# Patient Record
Sex: Female | Born: 1950
Health system: Southern US, Community
[De-identification: ages and names within clinical notes are randomized; demographics above are authoritative.]

## PROBLEM LIST (undated history)

## (undated) DIAGNOSIS — M069 Rheumatoid arthritis, unspecified: Secondary | ICD-10-CM

## (undated) HISTORY — DX: Rheumatoid arthritis, unspecified: M06.9

## (undated) HISTORY — PX: BREAST BIOPSY: SHX20

## (undated) HISTORY — PX: HIP SURGERY: SHX245

## (undated) HISTORY — PX: SHOULDER SURGERY: SHX246

---

## 2012-03-07 DIAGNOSIS — M169 Osteoarthritis of hip, unspecified: Secondary | ICD-10-CM | POA: Insufficient documentation

## 2014-03-08 ENCOUNTER — Encounter: Payer: Self-pay | Admitting: Internal Medicine

## 2014-03-08 ENCOUNTER — Ambulatory Visit (INDEPENDENT_AMBULATORY_CARE_PROVIDER_SITE_OTHER): Payer: BC Managed Care – PPO | Admitting: Internal Medicine

## 2014-03-08 ENCOUNTER — Telehealth: Payer: Self-pay | Admitting: Internal Medicine

## 2014-03-08 VITALS — BP 100/62 | HR 76 | Temp 97.5°F | Resp 12 | Ht 65.0 in | Wt 130.0 lb

## 2014-03-08 DIAGNOSIS — E039 Hypothyroidism, unspecified: Secondary | ICD-10-CM | POA: Insufficient documentation

## 2014-03-08 MED ORDER — LEVOTHYROXINE SODIUM 50 MCG PO TABS: 50.0000 ug | ORAL_TABLET | Freq: Every day | ORAL | Status: DC

## 2014-03-08 NOTE — Progress Notes (Signed)
Patient ID: Virginia Gordon, female   DOB: 1950-10-09, 63 y.o.   MRN: [id number]   HPI  [name] is a 63 y.o.-year-old female, referred by her PCP, Dr. Moreen Fowler, for management of hypothyroidism.  Pt. Had neck surgery 36 years ago (thyroid not removed - a non-thyroidal benign mass). She has been dx with hypothyroidism in 09/2012; started on Levothyroxine (LT4) 25 mcg >> then increased (she took it on herself to increase the dose to 2x >> 150 mcg daily >> felt normal >> now is on Levothyroxine 75 mcg, taken: - fasting - with water - separated by >30 min from b'fast  - takes in am: Adderrall, biotin, vitamin C, vitamin D, Naproxen-Prilosec (Vimovo) at the same time with LT4 - no calcium, iron, PPIs, multivitamins   I reviewed pt's thyroid tests: 12/06/2013; TSH 1.32, fT4 1.86   Pt describes: - high BP >> took HCTZ >> stopped now >> saw nephrology in the past (has a low Na, 128).  - had fatigue, now resolved (from MTX) - + weight gain and weight loss: 2-3 lbs a day - + cold intolerance/heat intolerance - no palpitations - + constipation/+ diarrhea - + dry skin - no hair falling - depression  Pt denies feeling nodules in neck, hoarseness, dysphagia/odynophagia, SOB with lying down.  She swims regularly.  She has + FH of thyroid disorders in: mother and daughter (hypothyroidism). No FH of thyroid cancer.  No h/o radiation tx to head or neck. No recent use of iodine supplements.  I reviewed her chart and she also has a history of RA, hyponatremia (chronic, followed by PCP).   ROS: Constitutional: see HPI, + increased urination Eyes: no blurry vision, no xerophthalmia ENT: no sore throat, no nodules palpated in throat, no dysphagia/odynophagia, no hoarseness Cardiovascular: no CP/SOB/palpitations/+ leg swelling Respiratory: no cough/SOB Gastrointestinal: no N/V/+ D/+ C Musculoskeletal: + both: muscle/joint aches Skin: + rash, + easy bruising Neurological: no  tremors/numbness/tingling/dizziness, + HA Psychiatric: no depression/anxiety  PMH: - hypothyroidism - hyponatremia - RA, OA - HL - ?HTN  PSxH: - 4 hip surgeries - 2 shoulder Sx  Occupational History  . Futures trader    Social History Main Topics  . Smoking status: Former Research scientist (life sciences)  . Smokeless tobacco: Not on file  . Alcohol Use: 3.5 oz/week    7 drink(s) per week  . Drug Use: No   Social History Research scientist (medical): Married    2 children; ages 64 and 64   First menstrual cycle at 11 yrs   Partial hysterectomy at 39 yrs   7 pregnancies, 6 miscarriages, 1 birth   Quit smoking in [place] - 2 glasses, daily   Regular execise: walking 2 times a week; Swimming 4 times a week   Name  Route  Sig   . amphetamine-dextroamphetamine (ADDERALL) 10 MG tablet      20 mg 2 (two) times daily.    . Ascorbic Acid (VITAMIN C) 500 MG CHEW   Oral   Chew by mouth.    . B Complex Vitamins (VITAMIN-B COMPLEX) TABS   Oral   Take by mouth.    . calcium carbonate 200 MG capsule   Oral   Take 250 mg by mouth daily.    . Cholecalciferol (VITAMIN D-1000 MAX ST) 1000 UNITS tablet   Oral   Take by mouth.    . diazepam (VALIUM) 10 MG tablet      every 12 (twelve) hours as needed.    Marland Kitchen  diazepam (VALIUM) 5 MG tablet          . ENBREL SURECLICK 50 MG/ML injection          . Estradiol (ELESTRIN) 0.52 MG/0.87 GM (0.06%) GEL   Topical   Apply topically.    . fexofenadine (ALLEGRA) 180 MG tablet   Oral   Take by mouth.    . fluticasone (FLONASE) 50 MCG/ACT nasal spray          . folic acid (FOLVITE) 1 MG tablet          . levothyroxine (SYNTHROID, LEVOTHROID) 75 MCG tablet   Oral   Take 1 tablet (75 mcg total) by mouth daily.    . Multiple Vitamin (MULTIVITAMIN) capsule   Oral   Take by mouth.    . Naproxen-Esomeprazole (VIMOVO) 500-20 MG TBEC          . oxyCODONE (OXY IR/ROXICODONE) 5 MG immediate release tablet   Oral   Take by mouth.    .  tretinoin (RETIN-A) 0.1 % cream   Topical   Apply topically.    Marland Kitchen zolpidem (AMBIEN) 10 MG tablet   Oral   Take by mouth.      Allergies  Allergen Reactions  . Vancomycin Dermatitis    Red, warm, coughing, anxious reaction   FH: - HTN, HL, heart ds in father  PE: BP 100/62  Pulse 76  Temp(Src) 97.5 F (36.4 C) (Oral)  Resp 12  Ht 5\' 5"  (1.651 m)  Wt 130 lb (58.968 kg)  BMI 21.63 kg/m2  SpO2 97% Wt Readings from Last 3 Encounters:  03/08/14 130 lb (58.968 kg)   Constitutional: normal weight, in NAD Eyes: PERRLA, EOMI, no exophthalmos ENT: moist mucous membranes, no thyromegaly, no cervical lymphadenopathy Cardiovascular: RRR, No MRG Respiratory: CTA B Gastrointestinal: abdomen soft, NT, ND, BS+ Musculoskeletal: no deformities, strength intact in all 4 Skin: moist, warm, no rashes Neurological: no tremor with outstretched hands, DTR normal in all 4  ASSESSMENT: 1. Hypothyroidism  PLAN:  1. Patient with long-standing hypothyroidism, on levothyroxine therapy. She appears euthyroid. She does not appear to have a goiter, thyroid nodules, or neck compression symptoms - she was wondering whether her LT4 tx could be related to her occasional high BP, but I doubt this, in the setting of a normal TSH - We discussed about correct intake of levothyroxine, fasting, with water, separated by at least 30 minutes from breakfast, and separated by more than 4 hours from calcium, iron, multivitamins, acid reflux medications (PPIs).She is now taking it along with her PPI >> advised to separate it by >4h. - since we are separating the LT4 from the PPI >> will also decrease the LT4 dose to 50 mcg daily - will check thyroid tests in 5-6 weeks: TSH, free T4 - I will see her back in 4 months

## 2014-03-08 NOTE — Telephone Encounter (Signed)
Called pt and advised her per Dr Arman Filter note. Advised the pt to call and schedule a time to come back and have labs drawn before she decreases the dose of Synthroid. Be advised

## 2014-03-08 NOTE — Telephone Encounter (Signed)
Patient stated that her synthorid haven't been re-tested since her primary doctor lowered it  to 75 mg.  She wanted Dr Cruzita Lederer to know.  Please advise

## 2014-03-08 NOTE — Telephone Encounter (Signed)
Oh, I thought the last set of labs were ON 75 mcg. It is not clear why the dose was decreased if TSH was normal? In that case, we will need a new set of labs before decreasing the dose. Sorry! Can you please order TSH, fT4 for now if she can come back.Marland KitchenMarland Kitchen

## 2014-03-08 NOTE — Patient Instructions (Signed)
Please reduce the dose of your Levothyroxine to 50 mcg and separate the Levothyroxine from Protonix by at least 4 hours.  Please come back for labs in 6 weeks.  Please return in 4 months.

## 2014-03-08 NOTE — Telephone Encounter (Signed)
Please read note below

## 2014-03-11 ENCOUNTER — Other Ambulatory Visit: Payer: BC Managed Care – PPO

## 2014-03-11 NOTE — Telephone Encounter (Signed)
Returned pt's call. Pt stated that she has only been on the 75 mcg for 3 weeks and she realized last week that she has been taking it incorrectly. Pt wanted to wait 3 more weeks before returning to have labs done so that there would be a more accurate reading of her thyroid. Pt re-scheduled with me to come in for labs on Aug. 31st. Be advised.

## 2014-03-11 NOTE — Telephone Encounter (Signed)
Patient would like for you to call her concerning her lab work.

## 2014-03-11 NOTE — Telephone Encounter (Signed)
OK, this sounds good.   

## 2014-04-01 ENCOUNTER — Other Ambulatory Visit: Payer: BC Managed Care – PPO

## 2014-04-19 ENCOUNTER — Other Ambulatory Visit (INDEPENDENT_AMBULATORY_CARE_PROVIDER_SITE_OTHER): Payer: BC Managed Care – PPO

## 2014-04-19 ENCOUNTER — Other Ambulatory Visit: Payer: BC Managed Care – PPO

## 2014-04-19 ENCOUNTER — Other Ambulatory Visit: Payer: Self-pay | Admitting: *Deleted

## 2014-04-19 DIAGNOSIS — E039 Hypothyroidism, unspecified: Secondary | ICD-10-CM

## 2014-04-19 LAB — T4, FREE: Free T4: 1.31 ng/dL (ref 0.60–1.60)

## 2014-04-19 LAB — TSH: TSH: 0.78 u[IU]/mL (ref 0.35–4.50)

## 2014-05-08 ENCOUNTER — Encounter (INDEPENDENT_AMBULATORY_CARE_PROVIDER_SITE_OTHER): Payer: Self-pay

## 2014-05-08 ENCOUNTER — Ambulatory Visit (INDEPENDENT_AMBULATORY_CARE_PROVIDER_SITE_OTHER): Payer: BC Managed Care – PPO | Admitting: Family Medicine

## 2014-05-08 VITALS — BP 151/96 | HR 69 | Ht 65.0 in | Wt 124.0 lb

## 2014-05-08 DIAGNOSIS — M722 Plantar fascial fibromatosis: Secondary | ICD-10-CM

## 2014-05-08 DIAGNOSIS — M79671 Pain in right foot: Secondary | ICD-10-CM

## 2014-05-08 DIAGNOSIS — M25511 Pain in right shoulder: Secondary | ICD-10-CM

## 2014-05-08 MED ORDER — NITROGLYCERIN 0.2 MG/HR TD PT24: MEDICATED_PATCH | TRANSDERMAL | Status: DC

## 2014-05-08 MED ORDER — METHYLPREDNISOLONE ACETATE 40 MG/ML IJ SUSP
40.0000 mg | Freq: Once | INTRAMUSCULAR | Status: AC
  Administered 2014-05-08: 40 mg via INTRA_ARTICULAR

## 2014-05-08 MED ORDER — METHYLPREDNISOLONE ACETATE 40 MG/ML IJ SUSP
40.0000 mg | Freq: Once | INTRAMUSCULAR | Status: AC
Start: 2014-05-08 — End: 2014-05-08
  Administered 2014-05-08: 40 mg via INTRA_ARTICULAR

## 2014-05-08 NOTE — Patient Instructions (Signed)
You have plantar fasciitis of your right foot as well as extensor digitorum tendinopathy. Naproxen and/or tylenol. Plantar fascia stretch for 20-30 seconds (do 3 of these) in morning Lowering/raise on a step exercises 3 x 10 once a day - this is very important for long term recovery. Consider cam walker to rest both of these when up and walking around. Ice heel for 15 minutes as needed. Avoid flat shoes/barefoot walking as much as possible. Orthotics with heel lift may be helpful. (consider dr. Zoe Lan active series insoles). Steroid injection is a consideration for short term pain relief if you are struggling - you were given this today. Physical therapy is also an option.  You have rotator cuff tendinitis of your right shoulder. Try to avoid painful activities (overhead activities, lifting with extended arm) as much as possible. Continue your naproxen. Can take tylenol in addition to this. Subacromial injection may be beneficial to help with pain and to decrease inflammation - you were given this today. Do home exercise program with theraband and scapular stabilization exercises daily - these are very important for long term relief even if an injection was given.  3 sets of 10 each direction once a day. Nitro patches - 1/4th of a patch placed over the right shoulder, change every day. Follow up with me in 6 weeks for reevaluation.

## 2014-05-10 ENCOUNTER — Telehealth: Payer: Self-pay | Admitting: Family Medicine

## 2014-05-13 NOTE — Telephone Encounter (Signed)
There aren't a ton of other exercises.  Besides the theraband exercises, raising/lowering on a step, we also discussed exercises where you push your toes forward on a towel.  Physical therapy is another option for this even if it's a visit or two to review a more extensive program.

## 2014-05-13 NOTE — Telephone Encounter (Signed)
Spoke with patient on Friday and she is wanting some exercises for the extensor digitorum tendinopathy. She was given the stretch and lowering/raise exercises to do. She would like to know if there are any other exercises.

## 2014-05-13 NOTE — Telephone Encounter (Signed)
Spoke with patient and told her the option to do Physical Therapy for a visit or two and she will let me know if she wants to go that route.

## 2014-05-14 ENCOUNTER — Encounter: Payer: Self-pay | Admitting: Family Medicine

## 2014-05-14 DIAGNOSIS — M25511 Pain in right shoulder: Secondary | ICD-10-CM | POA: Insufficient documentation

## 2014-05-14 DIAGNOSIS — M25512 Pain in left shoulder: Secondary | ICD-10-CM

## 2014-05-14 DIAGNOSIS — M79671 Pain in right foot: Secondary | ICD-10-CM | POA: Insufficient documentation

## 2014-05-14 NOTE — Assessment & Plan Note (Signed)
Right shoulder rotator cuff tendinitis - avoid overhead lifting, reaching as much as possible.  Continue naproxen.  Subacromial injection given today.  Shown home exercises as well.  Nitro patches - 1/4th of patch, change daily.  Discussed risks of skin irritation, headache.  Consider physical therapy if not improving.  Follow up in 4-6 weeks.  After informed written consent, patient was seated on exam table. Right shoulder was prepped with alcohol swab and utilizing posterior approach, patient's right subacromial space was injected with 3:1 marcaine: depomedrol. Patient tolerated the procedure well without immediate complications.

## 2014-05-14 NOTE — Assessment & Plan Note (Signed)
2/2 combination of plantar fasciitis and extensor digitorum tendinopathy.  Shown home exercise program to do daily.  Naproxen or tylenol.  Consider cam walker initially especially for extensor tendinopathy.  Icing, avoid flat shoes/barefoot walking.  Given cortisone injection for plantar fascia pain today as well.  Consider physical therapy.  After informed written consent patient was seated on exam table.  Area of maximal pain within midportion of plantar fascia prepped with alcohol swab.  Then patient's right plantar fascia was injected with 2:1 marcaine: depomedrol with multiple needle fenestrations.  Patient tolerated procedure well without immediate complications.

## 2014-05-14 NOTE — Progress Notes (Signed)
Patient ID: Virginia Gordon, female   DOB: [birth year], 63 y.o.   MRN: [id number]  PCP: Gara Kroner, MD  Subjective:   HPI: Patient is a 63 y.o. female here for right foot and right shoulder pain.  1. Right foot pain - Started having problems over a year ago after walking a lot in [place]. Started to get arch pain, swelling. Has rheumatoid arthritis. Reports her rheumatologist did give her a shot dorsal foot in past and has done an ultrasound on her foot. Unsure where the shot was placed and for what condition though. Bothers her more by end of day. Tried naproxen. No issues with sitting, rest.  2. Right shoulder pain - about 1 1/2 years ago had rotator cuff surgery twice for left shoulder. She does a lot of overhead activities Had a cortisone injection in March with some benefit. Physical therapy has not been helpful. Pain up to 4/10 with activities.  Past Medical History  Diagnosis Date  . Rheumatoid arthritis     Current Outpatient Prescriptions on File Prior to Visit  Medication Sig Dispense Refill  . amphetamine-dextroamphetamine (ADDERALL) 10 MG tablet 20 mg 2 (two) times daily.      . Ascorbic Acid (VITAMIN C) 500 MG CHEW Chew by mouth.      . B Complex Vitamins (VITAMIN-B COMPLEX) TABS Take by mouth.      . calcium carbonate 200 MG capsule Take 250 mg by mouth daily.      . Cholecalciferol (VITAMIN D-1000 MAX ST) 1000 UNITS tablet Take by mouth.      . diazepam (VALIUM) 10 MG tablet every 12 (twelve) hours as needed.      . diazepam (VALIUM) 5 MG tablet       . ENBREL SURECLICK 50 MG/ML injection       . Estradiol (ELESTRIN) 0.52 MG/0.87 GM (0.06%) GEL Apply topically.      . fexofenadine (ALLEGRA) 180 MG tablet Take by mouth.      . fluticasone (FLONASE) 50 MCG/ACT nasal spray       . folic acid (FOLVITE) 1 MG tablet       . levothyroxine (SYNTHROID, LEVOTHROID) 50 MCG tablet Take 1 tablet (50 mcg total) by mouth daily.  60 tablet  3  . Multiple Vitamin (MULTIVITAMIN)  capsule Take by mouth.      . Naproxen-Esomeprazole (VIMOVO) 500-20 MG TBEC       . oxyCODONE (OXY IR/ROXICODONE) 5 MG immediate release tablet Take by mouth.      . tretinoin (RETIN-A) 0.1 % cream Apply topically.      Marland Kitchen zolpidem (AMBIEN) 10 MG tablet Take by mouth.       No current facility-administered medications on file prior to visit.    Past Surgical History  Procedure Laterality Date  . Hip surgery    . Shoulder surgery      Allergies  Allergen Reactions  . Vancomycin Dermatitis    Red, warm, coughing, anxious reaction    History   Social History  . Marital Status: Unknown    Spouse Name: N/A    Number of Children: 1  . Years of Education: N/A   Occupational History  . Futures trader    Social History Main Topics  . Smoking status: Former Research scientist (life sciences)  . Smokeless tobacco: Not on file  . Alcohol Use: 3.5 oz/week    7 drink(s) per week  . Drug Use: No  . Sexual Activity: Not on file   Other Topics Concern  .  Not on file   Social History Narrative   Futures trader: Married    2 children; ages 71 and 46   First menstrual cycle at 11 yrs   Partial hysterectomy at 39 yrs   7 pregnancies, 6 miscarriages, 1 birth   Quit smoking in [place] - 2 glasses, daily   Regular execise: walking 2 times a week; Swimming 4 times a week    History reviewed. No pertinent family history.  BP 151/96  Pulse 69  Ht 5\' 5"  (1.651 m)  Wt 124 lb (56.246 kg)  BMI 20.63 kg/m2  Review of Systems: See HPI above.    Objective:  Physical Exam:  Gen: NAD  Right foot/ankle: No gross deformity, swelling, ecchymoses FROM with 5/5 strength all directions. TTP over extensor tendons as they cross ankle as well as within plantar fascia. Negative ant drawer and talar tilt.   Negative syndesmotic compression. Negative calcaneal squeeze, metatarsal squeeze. Thompsons test negative. NV intact distally.  Right shoulder: No swelling, ecchymoses.  No gross deformity. No  TTP. FROM without painful arc. Negative Hawkins, Neers. Negative Speeds, Yergasons. Strength 5/5 with empty can and resisted internal/external rotation.  Pain empty can. Negative apprehension. NV intact distally.    Assessment & Plan:  1. Right foot pain - 2/2 combination of plantar fasciitis and extensor digitorum tendinopathy.  Shown home exercise program to do daily.  Naproxen or tylenol.  Consider cam walker initially especially for extensor tendinopathy.  Icing, avoid flat shoes/barefoot walking.  Given cortisone injection for plantar fascia pain today as well.  Consider physical therapy.  After informed written consent patient was seated on exam table.  Area of maximal pain within midportion of plantar fascia prepped with alcohol swab.  Then patient's right plantar fascia was injected with 2:1 marcaine: depomedrol with multiple needle fenestrations.  Patient tolerated procedure well without immediate complications.  2. Right shoulder rotator cuff tendinitis - avoid overhead lifting, reaching as much as possible.  Continue naproxen.  Subacromial injection given today.  Shown home exercises as well.  Nitro patches - 1/4th of patch, change daily.  Discussed risks of skin irritation, headache.  Consider physical therapy if not improving.  Follow up in 4-6 weeks.  After informed written consent, patient was seated on exam table. Right shoulder was prepped with alcohol swab and utilizing posterior approach, patient's right subacromial space was injected with 3:1 marcaine: depomedrol. Patient tolerated the procedure well without immediate complications.

## 2014-05-16 ENCOUNTER — Ambulatory Visit (INDEPENDENT_AMBULATORY_CARE_PROVIDER_SITE_OTHER): Payer: BC Managed Care – PPO | Admitting: Family Medicine

## 2014-05-16 ENCOUNTER — Encounter: Payer: Self-pay | Admitting: Family Medicine

## 2014-05-16 VITALS — BP 163/109 | HR 69 | Ht 65.0 in | Wt 123.0 lb

## 2014-05-16 DIAGNOSIS — M25562 Pain in left knee: Secondary | ICD-10-CM

## 2014-05-16 MED ORDER — METHYLPREDNISOLONE ACETATE 40 MG/ML IJ SUSP
40.0000 mg | Freq: Once | INTRAMUSCULAR | Status: AC
Start: 2014-05-16 — End: 2014-05-16
  Administered 2014-05-16: 40 mg via INTRA_ARTICULAR

## 2014-05-16 NOTE — Patient Instructions (Signed)
I'm concerned you tore the medial meniscus of your left knee. You were given a cortisone shot today. Icing 15 minutes at a time 3-4 times a day. Ace wrap, knee sleeve, or knee brace for support and compression. Elevate above the level of your heart when possible. Knee extensions, straight leg raises, hamstring curls 3 sets of 10 once a day. Consider physical therapy as well.  Consider scaphoid pads in your dress shoes. Follow up with me in 5-6 weeks.

## 2014-05-20 ENCOUNTER — Encounter: Payer: Self-pay | Admitting: Family Medicine

## 2014-05-20 DIAGNOSIS — M25562 Pain in left knee: Secondary | ICD-10-CM | POA: Insufficient documentation

## 2014-05-20 NOTE — Assessment & Plan Note (Signed)
consistent with medial meniscus tear.  Went ahead with intraarticular injection today.  Icing, ace wrap or brace for support and compression.  Start home exercise program which was reviewed today.  Consider physical therapy.  F/u in 5-6 weeks.    After informed written consent, patient was lying supine on exam table. Left knee was prepped with alcohol swab and utilizing superolateral approach with ultrasound guidance, patient's left knee was injected intraarticularly with 3:1 marcaine: depomedrol. Patient tolerated the procedure well without immediate complications.

## 2014-05-20 NOTE — Progress Notes (Signed)
Patient ID: Virginia Gordon, female   DOB: [birth year], 62 y.o.   MRN: [id number]  PCP: Gara Kroner, MD  Subjective:   HPI: Patient is a 63 y.o. female here for left knee pain.  Patient reports she went hiking in the [place] yesterday about 2 miles. Turned around to come down from trail and felt a pull in back of knee. Couldn't walk after about 20 minutes. Iced the area, rested. Slightly swollen since then. No catching, locking. No prior issues with this knee.  Past Medical History  Diagnosis Date  . Rheumatoid arthritis     Current Outpatient Prescriptions on File Prior to Visit  Medication Sig Dispense Refill  . amphetamine-dextroamphetamine (ADDERALL) 10 MG tablet 20 mg 2 (two) times daily.      . Ascorbic Acid (VITAMIN C) 500 MG CHEW Chew by mouth.      . B Complex Vitamins (VITAMIN-B COMPLEX) TABS Take by mouth.      . calcium carbonate 200 MG capsule Take 250 mg by mouth daily.      . Cholecalciferol (VITAMIN D-1000 MAX ST) 1000 UNITS tablet Take by mouth.      . diazepam (VALIUM) 10 MG tablet every 12 (twelve) hours as needed.      . diazepam (VALIUM) 5 MG tablet       . ENBREL SURECLICK 50 MG/ML injection       . Estradiol (ELESTRIN) 0.52 MG/0.87 GM (0.06%) GEL Apply topically.      . fexofenadine (ALLEGRA) 180 MG tablet Take by mouth.      . fluticasone (FLONASE) 50 MCG/ACT nasal spray       . folic acid (FOLVITE) 1 MG tablet       . levothyroxine (SYNTHROID, LEVOTHROID) 50 MCG tablet Take 1 tablet (50 mcg total) by mouth daily.  60 tablet  3  . Multiple Vitamin (MULTIVITAMIN) capsule Take by mouth.      . Naproxen-Esomeprazole (VIMOVO) 500-20 MG TBEC       . nitroGLYCERIN (NITRODUR - DOSED IN MG/24 HR) 0.2 mg/hr patch Apply 1/4th patch to affected shoulder, change daily  30 patch  1  . oxyCODONE (OXY IR/ROXICODONE) 5 MG immediate release tablet Take by mouth.      . tretinoin (RETIN-A) 0.1 % cream Apply topically.      Marland Kitchen zolpidem (AMBIEN) 10 MG tablet Take by mouth.        No current facility-administered medications on file prior to visit.    Past Surgical History  Procedure Laterality Date  . Hip surgery    . Shoulder surgery      Allergies  Allergen Reactions  . Vancomycin Dermatitis    Red, warm, coughing, anxious reaction    History   Social History  . Marital Status: Unknown    Spouse Name: N/A    Number of Children: 1  . Years of Education: N/A   Occupational History  . Futures trader    Social History Main Topics  . Smoking status: Former Research scientist (life sciences)  . Smokeless tobacco: Not on file  . Alcohol Use: 3.5 oz/week    7 drink(s) per week  . Drug Use: No  . Sexual Activity: Not on file   Other Topics Concern  . Not on file   Social History Narrative   Futures trader: Married    2 children; ages 56 and 103   First menstrual cycle at 11 yrs   Partial hysterectomy at 39 yrs   7 pregnancies, 6 miscarriages, 1 birth  Quit smoking in 1982   Wine - 2 glasses, daily   Regular execise: walking 2 times a week; Swimming 4 times a week    No family history on file.  BP 163/109  Pulse 69  Ht 5\' 5"  (1.651 m)  Wt 123 lb (55.792 kg)  BMI 20.47 kg/m2  Review of Systems: See HPI above.    Objective:  Physical Exam:  Gen: NAD  Left knee: Mod effusion.  No bruising, other deformity. TTP medial joint line.  No other tenderness. FROM. Negative ant/post drawers. Negative valgus/varus testing. Negative lachmanns. Positive mcmurrays, apleys, thessalys.  Negative patellar apprehension. NV intact distally.    Assessment & Plan:  1. Left knee injury - consistent with medial meniscus tear.  Went ahead with intraarticular injection today.  Icing, ace wrap or brace for support and compression.  Start home exercise program which was reviewed today.  Consider physical therapy.  F/u in 5-6 weeks.    After informed written consent, patient was lying supine on exam table. Left knee was prepped with alcohol swab and utilizing  superolateral approach with ultrasound guidance, patient's left knee was injected intraarticularly with 3:1 marcaine: depomedrol. Patient tolerated the procedure well without immediate complications.

## 2014-06-04 ENCOUNTER — Encounter: Payer: Self-pay | Admitting: Family Medicine

## 2014-06-19 ENCOUNTER — Encounter: Payer: Self-pay | Admitting: Family Medicine

## 2014-06-19 ENCOUNTER — Ambulatory Visit (INDEPENDENT_AMBULATORY_CARE_PROVIDER_SITE_OTHER): Payer: BC Managed Care – PPO | Admitting: Family Medicine

## 2014-06-19 ENCOUNTER — Encounter (INDEPENDENT_AMBULATORY_CARE_PROVIDER_SITE_OTHER): Payer: Self-pay

## 2014-06-19 VITALS — HR 76 | Ht 65.0 in | Wt 124.0 lb

## 2014-06-19 DIAGNOSIS — M25562 Pain in left knee: Secondary | ICD-10-CM

## 2014-06-20 NOTE — Progress Notes (Signed)
Patient ID: Virginia Gordon, female   DOB: 28-Oct-1950, 63 y.o.   MRN: [id number]  PCP: Gara Kroner, MD  Subjective:   HPI: Patient is a 63 y.o. female here for left knee pain.  10/15: Patient reports she went hiking in the [place] yesterday about 2 miles. Turned around to come down from trail and felt a pull in back of knee. Couldn't walk after about 20 minutes. Iced the area, rested. Slightly swollen since then. No catching, locking. No prior issues with this knee.  11/18: Patient reports she feels significantly better. No knee pain now. Back to full activities and exercise. Right foot still gets some swelling dorsolaterally but pain much better. Doing home exercises, wearing arch binder.  Past Medical History  Diagnosis Date  . Rheumatoid arthritis     Current Outpatient Prescriptions on File Prior to Visit  Medication Sig Dispense Refill  . amphetamine-dextroamphetamine (ADDERALL) 10 MG tablet 20 mg 2 (two) times daily.    . Ascorbic Acid (VITAMIN C) 500 MG CHEW Chew by mouth.    . B Complex Vitamins (VITAMIN-B COMPLEX) TABS Take by mouth.    . calcium carbonate 200 MG capsule Take 250 mg by mouth daily.    . Cholecalciferol (VITAMIN D-1000 MAX ST) 1000 UNITS tablet Take by mouth.    . diazepam (VALIUM) 10 MG tablet every 12 (twelve) hours as needed.    . diazepam (VALIUM) 5 MG tablet     . ENBREL SURECLICK 50 MG/ML injection     . Estradiol (ELESTRIN) 0.52 MG/0.87 GM (0.06%) GEL Apply topically.    . fexofenadine (ALLEGRA) 180 MG tablet Take by mouth.    . fluticasone (FLONASE) 50 MCG/ACT nasal spray     . folic acid (FOLVITE) 1 MG tablet     . levothyroxine (SYNTHROID, LEVOTHROID) 50 MCG tablet Take 1 tablet (50 mcg total) by mouth daily. 60 tablet 3  . Multiple Vitamin (MULTIVITAMIN) capsule Take by mouth.    . Naproxen-Esomeprazole (VIMOVO) 500-20 MG TBEC     . nitroGLYCERIN (NITRODUR - DOSED IN MG/24 HR) 0.2 mg/hr patch Apply 1/4th patch to affected shoulder,  change daily 30 patch 1  . oxyCODONE (OXY IR/ROXICODONE) 5 MG immediate release tablet Take by mouth.    . tretinoin (RETIN-A) 0.1 % cream Apply topically.    Marland Kitchen zolpidem (AMBIEN) 10 MG tablet Take by mouth.     No current facility-administered medications on file prior to visit.    Past Surgical History  Procedure Laterality Date  . Hip surgery    . Shoulder surgery      Allergies  Allergen Reactions  . Vancomycin Dermatitis    Red, warm, coughing, anxious reaction    History   Social History  . Marital Status: Unknown    Spouse Name: N/A    Number of Children: 1  . Years of Education: N/A   Occupational History  . Futures trader    Social History Main Topics  . Smoking status: Former Research scientist (life sciences)  . Smokeless tobacco: Not on file  . Alcohol Use: 4.2 oz/week    7 Not specified per week  . Drug Use: No  . Sexual Activity: Not on file   Other Topics Concern  . Not on file   Social History Narrative   Futures trader: Married    2 children; ages 29 and 66   First menstrual cycle at 11 yrs   Partial hysterectomy at 39 yrs   7 pregnancies, 6 miscarriages, 1 birth  Quit smoking in 1982   Wine - 2 glasses, daily   Regular execise: walking 2 times a week; Swimming 4 times a week    No family history on file.  Pulse 76  Ht 5\' 5"  (1.651 m)  Wt 124 lb (56.246 kg)  BMI 20.63 kg/m2  Review of Systems: See HPI above.    Objective:  Physical Exam:  Gen: NAD  Left knee: No effusion.  No bruising, other deformity. No TTP medial joint line.  No other tenderness. FROM. Negative ant/post drawers. Negative valgus/varus testing. Negative lachmanns. Negative mcmurrays, apleys, thessalys.  Negative patellar apprehension. NV intact distally.    Assessment & Plan:  1. Left knee injury - consistent with medial meniscus tear.  Clinically improved now following injection, HEP, icing, wrapping.

## 2014-06-20 NOTE — Assessment & Plan Note (Signed)
consistent with medial meniscus tear.  Clinically improved now following injection, HEP, icing, wrapping.

## 2014-07-03 ENCOUNTER — Other Ambulatory Visit: Payer: Self-pay | Admitting: Family Medicine

## 2014-07-08 ENCOUNTER — Ambulatory Visit: Payer: BC Managed Care – PPO | Admitting: Internal Medicine

## 2014-08-05 ENCOUNTER — Ambulatory Visit (INDEPENDENT_AMBULATORY_CARE_PROVIDER_SITE_OTHER): Payer: BLUE CROSS/BLUE SHIELD | Admitting: Internal Medicine

## 2014-08-05 ENCOUNTER — Other Ambulatory Visit (INDEPENDENT_AMBULATORY_CARE_PROVIDER_SITE_OTHER): Payer: BLUE CROSS/BLUE SHIELD

## 2014-08-05 ENCOUNTER — Encounter: Payer: Self-pay | Admitting: Internal Medicine

## 2014-08-05 VITALS — BP 112/62 | HR 83 | Temp 97.8°F | Resp 12 | Wt 131.0 lb

## 2014-08-05 DIAGNOSIS — E039 Hypothyroidism, unspecified: Secondary | ICD-10-CM

## 2014-08-05 LAB — TSH: TSH: 1.63 u[IU]/mL (ref 0.35–4.50)

## 2014-08-05 LAB — T4, FREE: FREE T4: 1.46 ng/dL (ref 0.60–1.60)

## 2014-08-05 NOTE — Patient Instructions (Signed)
Please stop at the lab ay [place]. Please come back for a follow-up appointment in 6 months

## 2014-08-05 NOTE — Progress Notes (Signed)
Patient ID: Virginia Gordon, female   DOB: 1951-07-05, 64 y.o.   MRN: [id number]   HPI  Virginia Gordon is a 64 y.o.-year-old female, returning for f/u for hypothyroidism. Last visit in 03/2014.  She had a URI for 1 mo. Now better.  Reviewed hx: Pt. Had neck surgery 36 years ago (thyroid not removed - a non-thyroidal benign mass). She has been dx with hypothyroidism in 09/2012; started on Levothyroxine (LT4) 25 mcg >> then increased (she took it on herself to increase the dose to 2x >> 150 mcg daily >> felt normal >> now is on Levothyroxine.  She takes LT4 75 mcg: - fasting - with water - separated by >30 min from b'fast  - takes in am: Adderrall, biotin, vitamin C, vitamin D, was taking Naproxen-Prilosec (Vimovo) at the same time with LT4 >> now 30 min later - + calcium, multivitamins at night  I reviewed pt's thyroid tests: Lab Results  Component Value Date   TSH 0.78 04/19/2014   FREET4 1.31 04/19/2014  12/06/2013; TSH 1.32, fT4 1.86   Pt describes: - + fatigue - + weight gain: 3 lbs - no cold intolerance/heat intolerance - no palpitations - no constipation/diarrhea - no dry skin - no hair falling - no depression  She had a steroid inj in Oct 2015.  Pt denies feeling nodules in neck, hoarseness, dysphagia/odynophagia, SOB with lying down.  She swims regularly.  I reviewed her chart and she also has a history of RA, hyponatremia (chronic, followed by PCP).   ROS: Constitutional: see HPI, + increased urination Eyes: no blurry vision, no xerophthalmia ENT: no sore throat, no nodules palpated in throat, no dysphagia/odynophagia, no hoarseness Cardiovascular: no CP/SOB/palpitations/+ leg swelling Respiratory: no cough/SOB Gastrointestinal: no N/V/D/C Musculoskeletal: + both: muscle/joint aches Skin: no rash, + easy bruising Neurological: no tremors/numbness/tingling/dizziness, + HA  I reviewed pt's medications, allergies, PMH, social hx, family hx, and changes were documented  in the history of present illness. Otherwise, unchanged from my initial visit note:  PMH: - hypothyroidism - hyponatremia - RA, OA - HL - ?HTN  PSxH: - 4 hip surgeries - 2 shoulder Sx  Occupational History  . Futures trader    Social History Main Topics  . Smoking status: Former Research scientist (life sciences)  . Smokeless tobacco: Not on file  . Alcohol Use: 3.5 oz/week    7 drink(s) per week  . Drug Use: No   Social History Research scientist (medical): Married    2 children; ages 81 and 83   First menstrual cycle at 11 yrs   Partial hysterectomy at 39 yrs   7 pregnancies, 6 miscarriages, 1 birth   Quit smoking in [place] - 2 glasses, daily   Regular execise: walking 2 times a week; Swimming 4 times a week   Name  Route  Sig   . amphetamine-dextroamphetamine (ADDERALL) 10 MG tablet      20 mg 2 (two) times daily.    . Ascorbic Acid (VITAMIN C) 500 MG CHEW   Oral   Chew by mouth.    . B Complex Vitamins (VITAMIN-B COMPLEX) TABS   Oral   Take by mouth.    . calcium carbonate 200 MG capsule   Oral   Take 250 mg by mouth daily.    . Cholecalciferol (VITAMIN D-1000 MAX ST) 1000 UNITS tablet   Oral   Take by mouth.    . diazepam (VALIUM) 10 MG tablet      every 12 (  twelve) hours as needed.    . diazepam (VALIUM) 5 MG tablet          . ENBREL SURECLICK 50 MG/ML injection          . Estradiol (ELESTRIN) 0.52 MG/0.87 GM (0.06%) GEL   Topical   Apply topically.    . fexofenadine (ALLEGRA) 180 MG tablet   Oral   Take by mouth.    . fluticasone (FLONASE) 50 MCG/ACT nasal spray          . folic acid (FOLVITE) 1 MG tablet          . levothyroxine (SYNTHROID, LEVOTHROID) 75 MCG tablet   Oral   Take 1 tablet (75 mcg total) by mouth daily.    . Multiple Vitamin (MULTIVITAMIN) capsule   Oral   Take by mouth.    . Naproxen-Esomeprazole (VIMOVO) 500-20 MG TBEC          . oxyCODONE (OXY IR/ROXICODONE) 5 MG immediate release tablet   Oral   Take by mouth.     . tretinoin (RETIN-A) 0.1 % cream   Topical   Apply topically.    Marland Kitchen zolpidem (AMBIEN) 10 MG tablet   Oral   Take by mouth.      Allergies  Allergen Reactions  . Vancomycin Dermatitis    Red, warm, coughing, anxious reaction   FH: - HTN, HL, heart ds in father  PE: BP 112/62 mmHg  Pulse 83  Temp(Src) 97.8 F (36.6 C) (Oral)  Resp 12  Wt 131 lb (59.421 kg)  SpO2 97% Wt Readings from Last 3 Encounters:  08/05/14 131 lb (59.421 kg)  06/19/14 124 lb (56.246 kg)  05/16/14 123 lb (55.792 kg)   Constitutional: normal weight, in NAD Eyes: PERRLA, EOMI, no exophthalmos ENT: moist mucous membranes, no thyromegaly, no cervical lymphadenopathy Cardiovascular: RRR, No MRG Respiratory: CTA B Gastrointestinal: abdomen soft, NT, ND, BS+ Musculoskeletal: no deformities, strength intact in all 4 Skin: moist, warm, no rashes Neurological: no tremor with outstretched hands, DTR normal in all 4  ASSESSMENT: 1. Hypothyroidism  PLAN:  1. Patient with long-standing hypothyroidism, on levothyroxine therapy. She appears euthyroid. She does not appear to have a goiter, thyroid nodules, or neck compression symptoms - We again discussed about correct intake of levothyroxine, fasting, with water, separated by at least 30 minutes from breakfast, and separated by more than 4 hours from calcium, iron, multivitamins, acid reflux medications (PPIs).She is now taking the PPI 30 min after the LT4. We can keep this like that if TFTs stable - continue LT4 dose 75 mcg daily - I will see her back in 6 months  Appointment on 08/05/2014  Component Date Value Ref Range Status  . TSH 08/05/2014 1.63  0.35 - 4.50 uIU/mL Final  . Free T4 08/05/2014 1.46  0.60 - 1.60 ng/dL Final   Labs normal >> continue current dose of LT4 and taking the PPI 30 min later.

## 2014-08-06 ENCOUNTER — Telehealth: Payer: Self-pay | Admitting: Internal Medicine

## 2014-08-06 NOTE — Telephone Encounter (Signed)
Pt needs to know lab results

## 2014-08-06 NOTE — Telephone Encounter (Signed)
Opened encounter in error  

## 2014-08-06 NOTE — Telephone Encounter (Signed)
Called pt and lvm advising her per Dr Arman Filter result note.

## 2014-08-27 ENCOUNTER — Encounter: Payer: Self-pay | Admitting: Family Medicine

## 2014-08-27 ENCOUNTER — Ambulatory Visit (INDEPENDENT_AMBULATORY_CARE_PROVIDER_SITE_OTHER): Payer: BLUE CROSS/BLUE SHIELD | Admitting: Family Medicine

## 2014-08-27 ENCOUNTER — Telehealth: Payer: Self-pay | Admitting: Family Medicine

## 2014-08-27 VITALS — BP 159/86 | HR 82 | Ht 65.0 in | Wt 125.0 lb

## 2014-08-27 DIAGNOSIS — M79671 Pain in right foot: Secondary | ICD-10-CM

## 2014-08-27 MED ORDER — METHYLPREDNISOLONE ACETATE 40 MG/ML IJ SUSP
40.0000 mg | Freq: Once | INTRAMUSCULAR | Status: AC
  Administered 2014-08-27: 20 mg via INTRA_ARTICULAR

## 2014-08-27 NOTE — Telephone Encounter (Signed)
It really depends where she's hurting.  If it's the bottom of the foot near the heel a shot is an option.  Most other locations the shot is not recommended or has not been shown to be helpful but still can be considered - in those locations other measures are recommended first.  Would probably need to see her.

## 2014-08-27 NOTE — Telephone Encounter (Signed)
Spoke with patient and she has been scheduled to come in this afternoon.

## 2014-08-27 NOTE — Telephone Encounter (Signed)
Returned call and left message for patient to call back.  

## 2014-08-29 NOTE — Progress Notes (Signed)
Patient ID: Virginia Gordon, female   DOB: [birth year], 64 y.o.   MRN: [id number]  PCP: Gara Kroner, MD  Subjective:   HPI: Patient is a 64 y.o. female here for right foot pain.  05/08/14: Right foot pain - Started having problems over a year ago after walking a lot in [place]. Started to get arch pain, swelling. Has rheumatoid arthritis. Reports her rheumatologist did give her a shot dorsal foot in past and has done an ultrasound on her foot. Unsure where the shot was placed and for what condition though. Bothers her more by end of day. Tried naproxen. No issues with sitting, rest.  08/27/14: Patient reports she's had worsening right foot pain for about 2 weeks now. Having pain multiple areas of her foot - fifth metatarsal, bunionette, bunion, dorsolateral foot. Some swelling dorsolateral foot. Worse with hiking boots and following longer walk. No new injuries.  Past Medical History  Diagnosis Date  . Rheumatoid arthritis     Current Outpatient Prescriptions on File Prior to Visit  Medication Sig Dispense Refill  . amphetamine-dextroamphetamine (ADDERALL) 10 MG tablet 20 mg 2 (two) times daily.    . Ascorbic Acid (VITAMIN C) 500 MG CHEW Chew by mouth.    . B Complex Vitamins (VITAMIN-B COMPLEX) TABS Take by mouth.    . calcium carbonate 200 MG capsule Take 250 mg by mouth daily.    . Cholecalciferol (VITAMIN D-1000 MAX ST) 1000 UNITS tablet Take by mouth.    . diazepam (VALIUM) 10 MG tablet every 12 (twelve) hours as needed.    . diazepam (VALIUM) 5 MG tablet     . ENBREL SURECLICK 50 MG/ML injection     . Estradiol (ELESTRIN) 0.52 MG/0.87 GM (0.06%) GEL Apply topically.    . fexofenadine (ALLEGRA) 180 MG tablet Take by mouth.    . fluticasone (FLONASE) 50 MCG/ACT nasal spray     . folic acid (FOLVITE) 1 MG tablet     . levothyroxine (SYNTHROID, LEVOTHROID) 50 MCG tablet Take 1 tablet (50 mcg total) by mouth daily. (Patient not taking: Reported on 08/05/2014) 60 tablet 3  .  levothyroxine (SYNTHROID, LEVOTHROID) 75 MCG tablet   1  . Multiple Vitamin (MULTIVITAMIN) capsule Take by mouth.    . Naproxen-Esomeprazole (VIMOVO) 500-20 MG TBEC     . nitroGLYCERIN (NITRODUR - DOSED IN MG/24 HR) 0.2 mg/hr patch Apply 1/4th patch to affected shoulder, change daily 30 patch 1  . oxyCODONE (OXY IR/ROXICODONE) 5 MG immediate release tablet Take by mouth.    . tretinoin (RETIN-A) 0.1 % cream Apply topically.    Marland Kitchen zolpidem (AMBIEN) 10 MG tablet Take by mouth.     No current facility-administered medications on file prior to visit.    Past Surgical History  Procedure Laterality Date  . Hip surgery    . Shoulder surgery      Allergies  Allergen Reactions  . Vancomycin Dermatitis    Red, warm, coughing, anxious reaction    History   Social History  . Marital Status: Unknown    Spouse Name: N/A    Number of Children: 1  . Years of Education: N/A   Occupational History  . Futures trader    Social History Main Topics  . Smoking status: Former Research scientist (life sciences)  . Smokeless tobacco: Not on file  . Alcohol Use: 4.2 oz/week    7 Not specified per week  . Drug Use: No  . Sexual Activity: Not on file   Other Topics Concern  .  Not on file   Social History Narrative   Futures trader: Married    2 children; ages 56 and 84   First menstrual cycle at 11 yrs   Partial hysterectomy at 39 yrs   7 pregnancies, 6 miscarriages, 1 birth   Quit smoking in [place] - 2 glasses, daily   Regular execise: walking 2 times a week; Swimming 4 times a week    No family history on file.  BP 159/86 mmHg  Pulse 82  Ht 5\' 5"  (1.651 m)  Wt 125 lb (56.7 kg)  BMI 20.80 kg/m2  Review of Systems: See HPI above.    Objective:  Physical Exam:  Gen: NAD  Right foot/ankle: No gross deformity, swelling, ecchymoses FROM with 5/5 strength all directions. TTP 1st MTP, 5th MTP, 5th metatarsal, and just distal to ATFL.  No other tenderness of foot/ankle.   Negative ant drawer  and talar tilt.   Negative syndesmotic compression. Negative calcaneal squeeze, metatarsal squeeze. Thompsons test negative. NV intact distally.    Assessment & Plan:  1. Right foot pain - Believe her major underlying issue is more dynamic, related to pronation and lack of cushion of arch, need for wide toebox with bunion and bunionette.  Encouraged wide toebox shoes, arch support.  Tried injections into 1st and 5th MTPs.  Home exercises.  MSK u/s of 5th metatarsal showed no evidence of stress fracture.  Icing, avoid flat shoes/barefoot walking.    After informed written consent patient was seated on exam table.  Ultrasound used to separately identify 1st MTP and 5th MTPs and marked with pen cap.  Alcohol swab used for prep then 1st MTP and 5th MTP of right foot separately injected each with 0.5:0.45mL marcaine: depomedrol.  Patient tolerated procedure well without immediate complications.

## 2014-09-02 ENCOUNTER — Telehealth: Payer: Self-pay | Admitting: Family Medicine

## 2014-09-03 NOTE — Telephone Encounter (Signed)
Spoke to patient and had her come in to get short cam boot. Patient came in and was fitted for cam boot.

## 2014-12-03 ENCOUNTER — Encounter: Payer: Self-pay | Admitting: Family Medicine

## 2014-12-03 ENCOUNTER — Ambulatory Visit (INDEPENDENT_AMBULATORY_CARE_PROVIDER_SITE_OTHER): Payer: BLUE CROSS/BLUE SHIELD | Admitting: Family Medicine

## 2014-12-03 VITALS — BP 154/104 | HR 75 | Ht 65.0 in | Wt 127.0 lb

## 2014-12-03 DIAGNOSIS — M25512 Pain in left shoulder: Secondary | ICD-10-CM

## 2014-12-03 DIAGNOSIS — M25511 Pain in right shoulder: Secondary | ICD-10-CM | POA: Diagnosis not present

## 2014-12-03 MED ORDER — METHYLPREDNISOLONE ACETATE 40 MG/ML IJ SUSP
40.0000 mg | Freq: Once | INTRAMUSCULAR | Status: AC
  Administered 2014-12-03: 40 mg via INTRA_ARTICULAR

## 2014-12-03 NOTE — Patient Instructions (Signed)
You have rotator cuff impingement of your shoulders. Try to avoid painful activities (overhead activities, lifting with extended arm) as much as possible. Tylenol, aleve if needed. Subacromial injections may be beneficial to help with pain and to decrease inflammation - you were given these today. Do home exercise program with theraband and scapular stabilization exercises daily - these are very important for long term relief even if an injection was given.  3 sets of 10 each direction once a day. Nitro patches - 1/4th of a patch placed over the right shoulder, change every day - if you're doing well after about 5-7 days you can use it on the other shoulder as well. Follow up with me when you return from your trip.

## 2014-12-05 NOTE — Assessment & Plan Note (Signed)
2/2 rotator cuff impingement.  Shown home exercises to do daily.  Injections given today.  Still has nitro patches from before - will use these as well - to start with one side for about a week before trying it on both.  F/u in about 6 weeks (is going on a cross country trip for that length of time).  After informed written consent, patient was seated on exam table. Right shoulder was prepped with alcohol swab and utilizing posterior approach, patient's right subacromial space was injected with 3:1 marcaine: depomedrol. Patient tolerated the procedure well without immediate complications.  After informed written consent, patient was seated on exam table. Left shoulder was prepped with alcohol swab and utilizing posterior approach, patient's left subacromial space was injected with 3:1 marcaine: depomedrol. Patient tolerated the procedure well without immediate complications.

## 2014-12-05 NOTE — Progress Notes (Signed)
PCP: Gara Kroner, MD  Subjective:   HPI: Patient is a 64 y.o. female here for bilateral shoulder pain.  Patient reports she's had problems with both shoulders in the past. Left one she had surgery on - believes it was a rotator cuff surgery - has an about 3 cm scar - in 2014. Right one had 2 shots for and improved. Recently has been getting into swimming and pain worsening to 4/10 in both shoulders with overhead motions. + night pain. No numbness/tingling.  Past Medical History  Diagnosis Date  . Rheumatoid arthritis     Current Outpatient Prescriptions on File Prior to Visit  Medication Sig Dispense Refill  . amphetamine-dextroamphetamine (ADDERALL) 10 MG tablet 20 mg 2 (two) times daily.    . Ascorbic Acid (VITAMIN C) 500 MG CHEW Chew by mouth.    . B Complex Vitamins (VITAMIN-B COMPLEX) TABS Take by mouth.    . calcium carbonate 200 MG capsule Take 250 mg by mouth daily.    . Cholecalciferol (VITAMIN D-1000 MAX ST) 1000 UNITS tablet Take by mouth.    . diazepam (VALIUM) 10 MG tablet every 12 (twelve) hours as needed.    . diazepam (VALIUM) 5 MG tablet     . ENBREL SURECLICK 50 MG/ML injection     . Estradiol (ELESTRIN) 0.52 MG/0.87 GM (0.06%) GEL Apply topically.    Marland Kitchen estradiol (ESTRACE) 1 MG tablet   0  . fexofenadine (ALLEGRA) 180 MG tablet Take by mouth.    . fluticasone (FLONASE) 50 MCG/ACT nasal spray     . folic acid (FOLVITE) 1 MG tablet     . hydroxychloroquine (PLAQUENIL) 200 MG tablet   0  . levothyroxine (SYNTHROID, LEVOTHROID) 50 MCG tablet Take 1 tablet (50 mcg total) by mouth daily. (Patient not taking: Reported on 08/05/2014) 60 tablet 3  . levothyroxine (SYNTHROID, LEVOTHROID) 75 MCG tablet   1  . Multiple Vitamin (MULTIVITAMIN) capsule Take by mouth.    . Naproxen-Esomeprazole (VIMOVO) 500-20 MG TBEC     . nitroGLYCERIN (NITRODUR - DOSED IN MG/24 HR) 0.2 mg/hr patch Apply 1/4th patch to affected shoulder, change daily 30 patch 1  . oxyCODONE (OXY  IR/ROXICODONE) 5 MG immediate release tablet Take by mouth.    . tretinoin (RETIN-A) 0.1 % cream Apply topically.    Marland Kitchen zolpidem (AMBIEN) 10 MG tablet Take by mouth.     No current facility-administered medications on file prior to visit.    Past Surgical History  Procedure Laterality Date  . Hip surgery    . Shoulder surgery      Allergies  Allergen Reactions  . Vancomycin Dermatitis    Red, warm, coughing, anxious reaction    History   Social History  . Marital Status: Unknown    Spouse Name: N/A  . Number of Children: 1  . Years of Education: N/A   Occupational History  . Futures trader    Social History Main Topics  . Smoking status: Former Research scientist (life sciences)  . Smokeless tobacco: Not on file  . Alcohol Use: 4.2 oz/week    7 Standard drinks or equivalent per week  . Drug Use: No  . Sexual Activity: Not on file   Other Topics Concern  . Not on file   Social History Narrative   Futures trader: Married    2 children; ages 66 and 32   First menstrual cycle at 11 yrs   Partial hysterectomy at 39 yrs   7 pregnancies, 6 miscarriages, 1 birth  Quit smoking in 1982   Wine - 2 glasses, daily   Regular execise: walking 2 times a week; Swimming 4 times a week    No family history on file.  BP 154/104 mmHg  Pulse 75  Ht 5\' 5"  (1.651 m)  Wt 127 lb (57.607 kg)  BMI 21.13 kg/m2  Review of Systems: See HPI above.    Objective:  Physical Exam:  Gen: NAD  Bilateral shoulders: No swelling, ecchymoses.  No gross deformity. No TTP. FROM with mild painful arc. Mild positive Hawkins, negative Neers. Negative Yergasons. Strength 5/5 with empty can and resisted internal/external rotation. Negative apprehension. NV intact distally.    Assessment & Plan:  1. Bilateral shoulder pain - 2/2 rotator cuff impingement.  Shown home exercises to do daily.  Injections given today.  Still has nitro patches from before - will use these as well - to start with one side for about  a week before trying it on both.  F/u in about 6 weeks (is going on a cross country trip for that length of time).  After informed written consent, patient was seated on exam table. Right shoulder was prepped with alcohol swab and utilizing posterior approach, patient's right subacromial space was injected with 3:1 marcaine: depomedrol. Patient tolerated the procedure well without immediate complications.  After informed written consent, patient was seated on exam table. Left shoulder was prepped with alcohol swab and utilizing posterior approach, patient's left subacromial space was injected with 3:1 marcaine: depomedrol. Patient tolerated the procedure well without immediate complications.

## 2015-02-07 ENCOUNTER — Other Ambulatory Visit (INDEPENDENT_AMBULATORY_CARE_PROVIDER_SITE_OTHER): Payer: BLUE CROSS/BLUE SHIELD

## 2015-02-07 ENCOUNTER — Other Ambulatory Visit: Payer: Self-pay | Admitting: *Deleted

## 2015-02-07 DIAGNOSIS — E039 Hypothyroidism, unspecified: Secondary | ICD-10-CM

## 2015-02-07 LAB — T4, FREE: Free T4: 1.61 ng/dL — ABNORMAL HIGH (ref 0.60–1.60)

## 2015-02-07 LAB — TSH: TSH: 1.36 u[IU]/mL (ref 0.35–4.50)

## 2015-02-10 ENCOUNTER — Ambulatory Visit: Payer: Self-pay | Admitting: Internal Medicine

## 2015-02-10 ENCOUNTER — Telehealth: Payer: Self-pay | Admitting: Internal Medicine

## 2015-02-10 MED ORDER — LEVOTHYROXINE SODIUM 75 MCG PO TABS: 75.0000 ug | ORAL_TABLET | Freq: Every day | ORAL | Status: DC

## 2015-02-10 NOTE — Telephone Encounter (Signed)
Pt wants to be sure that Dr Cari Caraway at [facility] has her recent lab results. Refill sent.

## 2015-02-10 NOTE — Telephone Encounter (Signed)
Patient called stating that her pharmacy had sent a refill request on her medication  Also, patient had labs and would like to know if the results are available   Rx: Synthroid 17mcg  Pharmacy: [facility]    Thank you

## 2015-02-10 NOTE — Telephone Encounter (Signed)
I will send them to her.

## 2015-03-19 ENCOUNTER — Ambulatory Visit: Payer: Self-pay | Admitting: Internal Medicine

## 2015-04-28 ENCOUNTER — Other Ambulatory Visit: Payer: Self-pay | Admitting: *Deleted

## 2015-04-28 MED ORDER — LEVOTHYROXINE SODIUM 75 MCG PO TABS: 75.0000 ug | ORAL_TABLET | Freq: Every day | ORAL | Status: DC

## 2015-06-28 DIAGNOSIS — R58 Hemorrhage, not elsewhere classified: Secondary | ICD-10-CM | POA: Diagnosis not present

## 2015-07-01 DIAGNOSIS — M2550 Pain in unspecified joint: Secondary | ICD-10-CM | POA: Diagnosis not present

## 2015-07-01 DIAGNOSIS — R58 Hemorrhage, not elsewhere classified: Secondary | ICD-10-CM | POA: Diagnosis not present

## 2015-07-01 DIAGNOSIS — R1013 Epigastric pain: Secondary | ICD-10-CM | POA: Diagnosis not present

## 2015-07-03 DIAGNOSIS — Z9889 Other specified postprocedural states: Secondary | ICD-10-CM | POA: Insufficient documentation

## 2015-07-03 DIAGNOSIS — Z96649 Presence of unspecified artificial hip joint: Secondary | ICD-10-CM | POA: Diagnosis not present

## 2015-07-03 DIAGNOSIS — M1612 Unilateral primary osteoarthritis, left hip: Secondary | ICD-10-CM | POA: Diagnosis not present

## 2015-07-03 DIAGNOSIS — Z96642 Presence of left artificial hip joint: Secondary | ICD-10-CM | POA: Diagnosis not present

## 2015-07-09 DIAGNOSIS — R21 Rash and other nonspecific skin eruption: Secondary | ICD-10-CM | POA: Diagnosis not present

## 2015-07-09 DIAGNOSIS — M150 Primary generalized (osteo)arthritis: Secondary | ICD-10-CM | POA: Diagnosis not present

## 2015-07-09 DIAGNOSIS — M0589 Other rheumatoid arthritis with rheumatoid factor of multiple sites: Secondary | ICD-10-CM | POA: Diagnosis not present

## 2015-07-09 DIAGNOSIS — T148 Other injury of unspecified body region: Secondary | ICD-10-CM | POA: Diagnosis not present

## 2015-07-16 DIAGNOSIS — M0589 Other rheumatoid arthritis with rheumatoid factor of multiple sites: Secondary | ICD-10-CM | POA: Diagnosis not present

## 2015-07-17 DIAGNOSIS — M162 Bilateral osteoarthritis resulting from hip dysplasia: Secondary | ICD-10-CM | POA: Diagnosis not present

## 2015-07-17 DIAGNOSIS — Z96642 Presence of left artificial hip joint: Secondary | ICD-10-CM | POA: Diagnosis not present

## 2015-07-17 DIAGNOSIS — T8484XD Pain due to internal orthopedic prosthetic devices, implants and grafts, subsequent encounter: Secondary | ICD-10-CM | POA: Diagnosis not present

## 2015-07-17 DIAGNOSIS — M25452 Effusion, left hip: Secondary | ICD-10-CM | POA: Diagnosis not present

## 2015-07-17 DIAGNOSIS — T8484XA Pain due to internal orthopedic prosthetic devices, implants and grafts, initial encounter: Secondary | ICD-10-CM | POA: Diagnosis not present

## 2015-07-17 DIAGNOSIS — Y33XXXD Other specified events, undetermined intent, subsequent encounter: Secondary | ICD-10-CM | POA: Diagnosis not present

## 2015-07-17 DIAGNOSIS — Z96643 Presence of artificial hip joint, bilateral: Secondary | ICD-10-CM | POA: Diagnosis not present

## 2015-07-24 ENCOUNTER — Other Ambulatory Visit: Payer: Self-pay | Admitting: *Deleted

## 2015-07-24 MED ORDER — LEVOTHYROXINE SODIUM 75 MCG PO TABS: 75.0000 ug | ORAL_TABLET | Freq: Every day | ORAL | Status: AC

## 2015-08-13 DIAGNOSIS — M0589 Other rheumatoid arthritis with rheumatoid factor of multiple sites: Secondary | ICD-10-CM | POA: Diagnosis not present

## 2015-08-26 DIAGNOSIS — G8929 Other chronic pain: Secondary | ICD-10-CM | POA: Diagnosis not present

## 2015-08-26 DIAGNOSIS — M79671 Pain in right foot: Secondary | ICD-10-CM | POA: Diagnosis not present

## 2015-09-01 DIAGNOSIS — E871 Hypo-osmolality and hyponatremia: Secondary | ICD-10-CM | POA: Diagnosis not present

## 2015-09-01 DIAGNOSIS — Z23 Encounter for immunization: Secondary | ICD-10-CM | POA: Diagnosis not present

## 2015-09-01 DIAGNOSIS — R1013 Epigastric pain: Secondary | ICD-10-CM | POA: Diagnosis not present

## 2015-09-01 DIAGNOSIS — M2550 Pain in unspecified joint: Secondary | ICD-10-CM | POA: Diagnosis not present

## 2015-09-01 DIAGNOSIS — I10 Essential (primary) hypertension: Secondary | ICD-10-CM | POA: Diagnosis not present

## 2015-09-01 DIAGNOSIS — F900 Attention-deficit hyperactivity disorder, predominantly inattentive type: Secondary | ICD-10-CM | POA: Diagnosis not present

## 2015-09-01 DIAGNOSIS — M85852 Other specified disorders of bone density and structure, left thigh: Secondary | ICD-10-CM | POA: Diagnosis not present

## 2015-09-01 DIAGNOSIS — E039 Hypothyroidism, unspecified: Secondary | ICD-10-CM | POA: Diagnosis not present

## 2015-09-01 DIAGNOSIS — R252 Cramp and spasm: Secondary | ICD-10-CM | POA: Diagnosis not present

## 2015-09-03 DIAGNOSIS — Z96649 Presence of unspecified artificial hip joint: Secondary | ICD-10-CM | POA: Diagnosis not present

## 2015-09-03 DIAGNOSIS — F902 Attention-deficit hyperactivity disorder, combined type: Secondary | ICD-10-CM | POA: Diagnosis not present

## 2015-09-10 DIAGNOSIS — M0589 Other rheumatoid arthritis with rheumatoid factor of multiple sites: Secondary | ICD-10-CM | POA: Diagnosis not present

## 2015-09-10 DIAGNOSIS — M150 Primary generalized (osteo)arthritis: Secondary | ICD-10-CM | POA: Diagnosis not present

## 2015-09-10 DIAGNOSIS — R21 Rash and other nonspecific skin eruption: Secondary | ICD-10-CM | POA: Diagnosis not present

## 2015-09-10 DIAGNOSIS — T148 Other injury of unspecified body region: Secondary | ICD-10-CM | POA: Diagnosis not present

## 2015-09-19 DIAGNOSIS — G8929 Other chronic pain: Secondary | ICD-10-CM | POA: Diagnosis not present

## 2015-09-19 DIAGNOSIS — M79671 Pain in right foot: Secondary | ICD-10-CM | POA: Diagnosis not present

## 2015-10-09 DIAGNOSIS — M0589 Other rheumatoid arthritis with rheumatoid factor of multiple sites: Secondary | ICD-10-CM | POA: Diagnosis not present

## 2015-10-21 DIAGNOSIS — Z87891 Personal history of nicotine dependence: Secondary | ICD-10-CM | POA: Diagnosis not present

## 2015-10-21 DIAGNOSIS — M659 Synovitis and tenosynovitis, unspecified: Secondary | ICD-10-CM | POA: Diagnosis not present

## 2015-10-21 DIAGNOSIS — K219 Gastro-esophageal reflux disease without esophagitis: Secondary | ICD-10-CM | POA: Diagnosis not present

## 2015-10-21 DIAGNOSIS — E079 Disorder of thyroid, unspecified: Secondary | ICD-10-CM | POA: Diagnosis not present

## 2015-10-21 DIAGNOSIS — I10 Essential (primary) hypertension: Secondary | ICD-10-CM | POA: Diagnosis not present

## 2015-10-21 DIAGNOSIS — Q796 Ehlers-Danlos syndrome: Secondary | ICD-10-CM | POA: Diagnosis not present

## 2015-10-21 DIAGNOSIS — M069 Rheumatoid arthritis, unspecified: Secondary | ICD-10-CM | POA: Diagnosis not present

## 2015-10-21 DIAGNOSIS — M1990 Unspecified osteoarthritis, unspecified site: Secondary | ICD-10-CM | POA: Diagnosis not present

## 2015-10-21 DIAGNOSIS — I7300 Raynaud's syndrome without gangrene: Secondary | ICD-10-CM | POA: Diagnosis not present

## 2015-10-22 DIAGNOSIS — T148 Other injury of unspecified body region: Secondary | ICD-10-CM | POA: Diagnosis not present

## 2015-10-22 DIAGNOSIS — Z23 Encounter for immunization: Secondary | ICD-10-CM | POA: Diagnosis not present

## 2015-10-22 DIAGNOSIS — S61419A Laceration without foreign body of unspecified hand, initial encounter: Secondary | ICD-10-CM | POA: Diagnosis not present

## 2015-10-27 DIAGNOSIS — I10 Essential (primary) hypertension: Secondary | ICD-10-CM | POA: Diagnosis not present

## 2015-10-27 DIAGNOSIS — I7300 Raynaud's syndrome without gangrene: Secondary | ICD-10-CM | POA: Diagnosis not present

## 2015-10-27 DIAGNOSIS — L603 Nail dystrophy: Secondary | ICD-10-CM | POA: Diagnosis not present

## 2015-10-27 DIAGNOSIS — M069 Rheumatoid arthritis, unspecified: Secondary | ICD-10-CM | POA: Diagnosis not present

## 2015-10-27 DIAGNOSIS — K219 Gastro-esophageal reflux disease without esophagitis: Secondary | ICD-10-CM | POA: Diagnosis not present

## 2015-11-12 ENCOUNTER — Ambulatory Visit (INDEPENDENT_AMBULATORY_CARE_PROVIDER_SITE_OTHER): Payer: Medicare Other | Admitting: Family Medicine

## 2015-11-12 VITALS — BP 144/98 | HR 73 | Ht 65.0 in | Wt 130.0 lb

## 2015-11-12 DIAGNOSIS — M25511 Pain in right shoulder: Secondary | ICD-10-CM

## 2015-11-12 DIAGNOSIS — M79671 Pain in right foot: Secondary | ICD-10-CM

## 2015-11-12 DIAGNOSIS — M25512 Pain in left shoulder: Secondary | ICD-10-CM | POA: Diagnosis not present

## 2015-11-12 MED ORDER — METHYLPREDNISOLONE ACETATE 40 MG/ML IJ SUSP
40.0000 mg | Freq: Once | INTRAMUSCULAR | Status: AC
  Administered 2015-11-12: 40 mg via INTRA_ARTICULAR

## 2015-11-12 NOTE — Patient Instructions (Signed)
Your primary issue with your foot is midfoot arthritis. These are the 4 medicines you can take for this: Take tylenol 500mg  1-2 tabs three times a day for pain. Aleve 1-2 tabs twice a day with food Glucosamine sulfate 750mg  twice a day is a supplement that may help. Capsaicin, aspercreme, or biofreeze topically up to four times a day may also help with pain. Cortisone injections are an option - you were given this today. It's important that you continue to stay active. Shoe inserts with good arch support may be helpful (dr. Zoe Lan active series, custom insoles). Ice 15 minutes at a time 3-4 times a day as needed to help with pain.  Your shoulder pain is due to a combination of biceps strain and impingement. Do the hammer and curl exercises 3 sets of 10 once a day. Consider injection if you're struggling. Can use the nitro patches for this. Follow up with me in 5-6 weeks (sooner if you want an injection).

## 2015-11-13 DIAGNOSIS — N959 Unspecified menopausal and perimenopausal disorder: Secondary | ICD-10-CM | POA: Insufficient documentation

## 2015-11-13 DIAGNOSIS — F988 Other specified behavioral and emotional disorders with onset usually occurring in childhood and adolescence: Secondary | ICD-10-CM | POA: Insufficient documentation

## 2015-11-13 DIAGNOSIS — L719 Rosacea, unspecified: Secondary | ICD-10-CM | POA: Insufficient documentation

## 2015-11-13 DIAGNOSIS — M351 Other overlap syndromes: Secondary | ICD-10-CM | POA: Insufficient documentation

## 2015-11-13 DIAGNOSIS — G479 Sleep disorder, unspecified: Secondary | ICD-10-CM | POA: Insufficient documentation

## 2015-11-13 NOTE — Assessment & Plan Note (Signed)
2/2 rotator cuff impingement and biceps strain.  Shown home exercises to do daily.  Still has some nitro patches from prior shoulder issues - will use these.  Consider injection if not improving, physical therapy.

## 2015-11-13 NOTE — Assessment & Plan Note (Signed)
severe midfoot DJD noted on today's MSK u/s and this corresponds with area of maximal tenderness.  Significant neovascularity here as well and synovitis.  Given injection.  Arch supports, icing, tylenol, nsaids, glucosamine, topical medications discussed.  F/u in 5-6 weeks.  After informed written consent patient was seated on exam table.  Area overlying right 3rd-4th TMT joint identified with ultrasound, prepped with alcohol swab and injected with 1:1 marcaine: depomedrol.  Patient tolerated procedure well without immediate complications.

## 2015-11-13 NOTE — Progress Notes (Signed)
PCP: MCNEILL,WENDY, MD  Subjective:   HPI: Patient is a 65 y.o. female here for right shoulder pain, right foot pain.  12/03/14: Patient reports she's had problems with both shoulders in the past. Left one she had surgery on - believes it was a rotator cuff surgery - has an about 3 cm scar - in 2014. Right one had 2 shots for and improved. Recently has been getting into swimming and pain worsening to 4/10 in both shoulders with overhead motions. + night pain. No numbness/tingling.  11/12/15: Patient returns with persistent right anterior shoulder pain. Also with diffuse dorsal right foot pain. History of jones fracture - had surgery and recovered - pain is more dorsal though, not over prior fracture site. Pain in shoulder is 1/10, improves with swimming. Pain in foot is 5/10 and sharp. Worse with walking. Associated swelling in foot. No numbness, skin changes.  Past Medical History  Diagnosis Date  . Rheumatoid arthritis     Current Outpatient Prescriptions on File Prior to Visit  Medication Sig Dispense Refill  . Ascorbic Acid (VITAMIN C) 500 MG CHEW Chew by mouth.    . B Complex Vitamins (VITAMIN-B COMPLEX) TABS Take by mouth.    . calcium carbonate 200 MG capsule Take 250 mg by mouth daily.    . Cholecalciferol (VITAMIN D-1000 MAX ST) 1000 UNITS tablet Take by mouth.    Scarlette Shorts SURECLICK 50 MG/ML injection     . estradiol (ESTRACE) 1 MG tablet   0  . fexofenadine (ALLEGRA) 180 MG tablet Take by mouth.    . fluticasone (FLONASE) 50 MCG/ACT nasal spray     . folic acid (FOLVITE) 1 MG tablet     . hydroxychloroquine (PLAQUENIL) 200 MG tablet   0  . levothyroxine (SYNTHROID, LEVOTHROID) 75 MCG tablet Take 1 tablet (75 mcg total) by mouth daily before breakfast. **PT NEEDS FOLLOW UP APPT** 45 tablet 0  . Multiple Vitamin (MULTIVITAMIN) capsule Take by mouth.    . nitroGLYCERIN (NITRODUR - DOSED IN MG/24 HR) 0.2 mg/hr patch Apply 1/4th patch to affected shoulder, change daily  30 patch 1  . tretinoin (RETIN-A) 0.1 % cream Apply topically.    Marland Kitchen zolpidem (AMBIEN) 10 MG tablet Take by mouth.     No current facility-administered medications on file prior to visit.    Past Surgical History  Procedure Laterality Date  . Hip surgery    . Shoulder surgery      Allergies  Allergen Reactions  . Vancomycin Dermatitis    Red, warm, coughing, anxious reaction    Social History   Social History  . Marital Status: Unknown    Spouse Name: N/A  . Number of Children: 1  . Years of Education: N/A   Occupational History  . Futures trader    Social History Main Topics  . Smoking status: Former Research scientist (life sciences)  . Smokeless tobacco: Not on file  . Alcohol Use: 4.2 oz/week    7 Standard drinks or equivalent per week  . Drug Use: No  . Sexual Activity: Not on file   Other Topics Concern  . Not on file   Social History Narrative   Futures trader: Married    2 children; ages 13 and 74   First menstrual cycle at 11 yrs   Partial hysterectomy at 39 yrs   7 pregnancies, 6 miscarriages, 1 birth   Quit smoking in [place] - 2 glasses, daily   Regular execise: walking 2 times a week;  Swimming 4 times a week    No family history on file.  BP 144/98 mmHg  Pulse 73  Ht 5\' 5"  (1.651 m)  Wt 130 lb (58.968 kg)  BMI 21.63 kg/m2  Review of Systems: See HPI above.    Objective:  Physical Exam:  Gen: NAD  Right shoulder: No swelling, ecchymoses.  No gross deformity. TTP mid lateral bicep. FROM with mild painful arc. Mild positive Hawkins, negative Neers. Negative Yergasons, speeds. Strength 5/5 with empty can and resisted internal/external rotation. Negative apprehension. NV intact distally.    Right foot/ankle: No gross deformity, swelling, ecchymoses FROM with 5/5 strength all directions. TTP anterior ankle joint and dorsal foot - greatest over midfoot at MTP joint.  No other tenderness including 1st and 5th MTPs now. Negative ant drawer and talar  tilt.  Negative syndesmotic compression. Negative calcaneal squeeze, metatarsal squeeze. Thompsons test negative. NV intact distally.  Assessment & Plan:  1. Right shoulder pain - 2/2 rotator cuff impingement and biceps strain.  Shown home exercises to do daily.  Still has some nitro patches from prior shoulder issues - will use these.  Consider injection if not improving, physical therapy.  2. Right foot pain - severe midfoot DJD noted on today's MSK u/s and this corresponds with area of maximal tenderness.  Significant neovascularity here as well and synovitis.  Given injection.  Arch supports, icing, tylenol, nsaids, glucosamine, topical medications discussed.  F/u in 5-6 weeks.  After informed written consent patient was seated on exam table.  Area overlying right 3rd-4th TMT joint identified with ultrasound, prepped with alcohol swab and injected with 1:1 marcaine: depomedrol.  Patient tolerated procedure well without immediate complications.

## 2015-12-04 DIAGNOSIS — Z79890 Hormone replacement therapy (postmenopausal): Secondary | ICD-10-CM | POA: Diagnosis not present

## 2015-12-05 DIAGNOSIS — M150 Primary generalized (osteo)arthritis: Secondary | ICD-10-CM | POA: Diagnosis not present

## 2015-12-05 DIAGNOSIS — M79642 Pain in left hand: Secondary | ICD-10-CM | POA: Diagnosis not present

## 2015-12-05 DIAGNOSIS — M79641 Pain in right hand: Secondary | ICD-10-CM | POA: Diagnosis not present

## 2015-12-05 DIAGNOSIS — M2550 Pain in unspecified joint: Secondary | ICD-10-CM | POA: Diagnosis not present

## 2015-12-05 DIAGNOSIS — M0589 Other rheumatoid arthritis with rheumatoid factor of multiple sites: Secondary | ICD-10-CM | POA: Diagnosis not present

## 2015-12-05 DIAGNOSIS — I7300 Raynaud's syndrome without gangrene: Secondary | ICD-10-CM | POA: Diagnosis not present

## 2015-12-09 DIAGNOSIS — M0589 Other rheumatoid arthritis with rheumatoid factor of multiple sites: Secondary | ICD-10-CM | POA: Diagnosis not present

## 2016-01-06 DIAGNOSIS — F902 Attention-deficit hyperactivity disorder, combined type: Secondary | ICD-10-CM | POA: Diagnosis not present

## 2016-02-04 DIAGNOSIS — M0589 Other rheumatoid arthritis with rheumatoid factor of multiple sites: Secondary | ICD-10-CM | POA: Diagnosis not present

## 2016-03-02 DIAGNOSIS — I10 Essential (primary) hypertension: Secondary | ICD-10-CM | POA: Diagnosis not present

## 2016-03-02 DIAGNOSIS — R1013 Epigastric pain: Secondary | ICD-10-CM | POA: Diagnosis not present

## 2016-03-02 DIAGNOSIS — E871 Hypo-osmolality and hyponatremia: Secondary | ICD-10-CM | POA: Diagnosis not present

## 2016-03-02 DIAGNOSIS — R252 Cramp and spasm: Secondary | ICD-10-CM | POA: Diagnosis not present

## 2016-03-02 DIAGNOSIS — F900 Attention-deficit hyperactivity disorder, predominantly inattentive type: Secondary | ICD-10-CM | POA: Diagnosis not present

## 2016-03-02 DIAGNOSIS — Z23 Encounter for immunization: Secondary | ICD-10-CM | POA: Diagnosis not present

## 2016-03-02 DIAGNOSIS — M85852 Other specified disorders of bone density and structure, left thigh: Secondary | ICD-10-CM | POA: Diagnosis not present

## 2016-03-02 DIAGNOSIS — M2550 Pain in unspecified joint: Secondary | ICD-10-CM | POA: Diagnosis not present

## 2016-03-02 DIAGNOSIS — E039 Hypothyroidism, unspecified: Secondary | ICD-10-CM | POA: Diagnosis not present

## 2016-03-16 DIAGNOSIS — E538 Deficiency of other specified B group vitamins: Secondary | ICD-10-CM | POA: Diagnosis not present

## 2016-03-16 DIAGNOSIS — T148 Other injury of unspecified body region: Secondary | ICD-10-CM | POA: Diagnosis not present

## 2016-03-16 DIAGNOSIS — I7300 Raynaud's syndrome without gangrene: Secondary | ICD-10-CM | POA: Diagnosis not present

## 2016-03-16 DIAGNOSIS — M2550 Pain in unspecified joint: Secondary | ICD-10-CM | POA: Diagnosis not present

## 2016-03-16 DIAGNOSIS — M150 Primary generalized (osteo)arthritis: Secondary | ICD-10-CM | POA: Diagnosis not present

## 2016-03-16 DIAGNOSIS — Z79899 Other long term (current) drug therapy: Secondary | ICD-10-CM | POA: Diagnosis not present

## 2016-03-16 DIAGNOSIS — R5383 Other fatigue: Secondary | ICD-10-CM | POA: Diagnosis not present

## 2016-03-16 DIAGNOSIS — M0589 Other rheumatoid arthritis with rheumatoid factor of multiple sites: Secondary | ICD-10-CM | POA: Diagnosis not present

## 2016-03-31 DIAGNOSIS — M0589 Other rheumatoid arthritis with rheumatoid factor of multiple sites: Secondary | ICD-10-CM | POA: Diagnosis not present

## 2016-04-08 DIAGNOSIS — F902 Attention-deficit hyperactivity disorder, combined type: Secondary | ICD-10-CM | POA: Diagnosis not present

## 2016-04-27 DIAGNOSIS — Z23 Encounter for immunization: Secondary | ICD-10-CM | POA: Diagnosis not present

## 2016-05-21 DIAGNOSIS — M25551 Pain in right hip: Secondary | ICD-10-CM | POA: Diagnosis not present

## 2016-05-21 DIAGNOSIS — M79671 Pain in right foot: Secondary | ICD-10-CM | POA: Diagnosis not present

## 2016-05-26 DIAGNOSIS — M0589 Other rheumatoid arthritis with rheumatoid factor of multiple sites: Secondary | ICD-10-CM | POA: Diagnosis not present

## 2016-05-27 DIAGNOSIS — M25551 Pain in right hip: Secondary | ICD-10-CM | POA: Diagnosis not present

## 2016-05-27 DIAGNOSIS — M79671 Pain in right foot: Secondary | ICD-10-CM | POA: Diagnosis not present

## 2016-06-16 DIAGNOSIS — M150 Primary generalized (osteo)arthritis: Secondary | ICD-10-CM | POA: Diagnosis not present

## 2016-06-16 DIAGNOSIS — Z79899 Other long term (current) drug therapy: Secondary | ICD-10-CM | POA: Diagnosis not present

## 2016-06-16 DIAGNOSIS — M2550 Pain in unspecified joint: Secondary | ICD-10-CM | POA: Diagnosis not present

## 2016-06-16 DIAGNOSIS — M0589 Other rheumatoid arthritis with rheumatoid factor of multiple sites: Secondary | ICD-10-CM | POA: Diagnosis not present

## 2016-07-21 DIAGNOSIS — M0589 Other rheumatoid arthritis with rheumatoid factor of multiple sites: Secondary | ICD-10-CM | POA: Diagnosis not present

## 2016-08-12 DIAGNOSIS — F902 Attention-deficit hyperactivity disorder, combined type: Secondary | ICD-10-CM | POA: Diagnosis not present

## 2016-09-15 DIAGNOSIS — M0589 Other rheumatoid arthritis with rheumatoid factor of multiple sites: Secondary | ICD-10-CM | POA: Diagnosis not present

## 2016-09-15 DIAGNOSIS — Z79899 Other long term (current) drug therapy: Secondary | ICD-10-CM | POA: Diagnosis not present

## 2016-09-30 DIAGNOSIS — R1013 Epigastric pain: Secondary | ICD-10-CM | POA: Diagnosis not present

## 2016-09-30 DIAGNOSIS — I10 Essential (primary) hypertension: Secondary | ICD-10-CM | POA: Diagnosis not present

## 2016-09-30 DIAGNOSIS — R252 Cramp and spasm: Secondary | ICD-10-CM | POA: Diagnosis not present

## 2016-09-30 DIAGNOSIS — M2550 Pain in unspecified joint: Secondary | ICD-10-CM | POA: Diagnosis not present

## 2016-09-30 DIAGNOSIS — F900 Attention-deficit hyperactivity disorder, predominantly inattentive type: Secondary | ICD-10-CM | POA: Diagnosis not present

## 2016-09-30 DIAGNOSIS — M069 Rheumatoid arthritis, unspecified: Secondary | ICD-10-CM | POA: Diagnosis not present

## 2016-09-30 DIAGNOSIS — M85852 Other specified disorders of bone density and structure, left thigh: Secondary | ICD-10-CM | POA: Diagnosis not present

## 2016-09-30 DIAGNOSIS — E039 Hypothyroidism, unspecified: Secondary | ICD-10-CM | POA: Diagnosis not present

## 2016-09-30 DIAGNOSIS — Z79890 Hormone replacement therapy (postmenopausal): Secondary | ICD-10-CM | POA: Diagnosis not present

## 2016-10-14 DIAGNOSIS — M0589 Other rheumatoid arthritis with rheumatoid factor of multiple sites: Secondary | ICD-10-CM | POA: Diagnosis not present

## 2016-10-14 DIAGNOSIS — Z6822 Body mass index (BMI) 22.0-22.9, adult: Secondary | ICD-10-CM | POA: Diagnosis not present

## 2016-10-14 DIAGNOSIS — M150 Primary generalized (osteo)arthritis: Secondary | ICD-10-CM | POA: Diagnosis not present

## 2016-10-14 DIAGNOSIS — M2550 Pain in unspecified joint: Secondary | ICD-10-CM | POA: Diagnosis not present

## 2016-10-14 DIAGNOSIS — Z79899 Other long term (current) drug therapy: Secondary | ICD-10-CM | POA: Diagnosis not present

## 2016-11-10 DIAGNOSIS — M0589 Other rheumatoid arthritis with rheumatoid factor of multiple sites: Secondary | ICD-10-CM | POA: Diagnosis not present

## 2016-11-17 DIAGNOSIS — S92325A Nondisplaced fracture of second metatarsal bone, left foot, initial encounter for closed fracture: Secondary | ICD-10-CM | POA: Diagnosis not present

## 2016-11-17 DIAGNOSIS — M79672 Pain in left foot: Secondary | ICD-10-CM | POA: Diagnosis not present

## 2016-11-24 DIAGNOSIS — S92325D Nondisplaced fracture of second metatarsal bone, left foot, subsequent encounter for fracture with routine healing: Secondary | ICD-10-CM | POA: Diagnosis not present

## 2016-11-24 DIAGNOSIS — Z9689 Presence of other specified functional implants: Secondary | ICD-10-CM | POA: Diagnosis not present

## 2016-11-24 DIAGNOSIS — M2041 Other hammer toe(s) (acquired), right foot: Secondary | ICD-10-CM | POA: Diagnosis not present

## 2016-12-15 DIAGNOSIS — Z969 Presence of functional implant, unspecified: Secondary | ICD-10-CM | POA: Diagnosis not present

## 2016-12-15 DIAGNOSIS — S92325D Nondisplaced fracture of second metatarsal bone, left foot, subsequent encounter for fracture with routine healing: Secondary | ICD-10-CM | POA: Diagnosis not present

## 2016-12-15 DIAGNOSIS — M14671 Charcot's joint, right ankle and foot: Secondary | ICD-10-CM | POA: Diagnosis not present

## 2016-12-23 DIAGNOSIS — F902 Attention-deficit hyperactivity disorder, combined type: Secondary | ICD-10-CM | POA: Diagnosis not present

## 2016-12-24 DIAGNOSIS — M14671 Charcot's joint, right ankle and foot: Secondary | ICD-10-CM | POA: Diagnosis not present

## 2016-12-31 DIAGNOSIS — S92325D Nondisplaced fracture of second metatarsal bone, left foot, subsequent encounter for fracture with routine healing: Secondary | ICD-10-CM | POA: Diagnosis not present

## 2016-12-31 DIAGNOSIS — M14671 Charcot's joint, right ankle and foot: Secondary | ICD-10-CM | POA: Diagnosis not present

## 2017-01-03 DIAGNOSIS — M2550 Pain in unspecified joint: Secondary | ICD-10-CM | POA: Diagnosis not present

## 2017-01-03 DIAGNOSIS — M150 Primary generalized (osteo)arthritis: Secondary | ICD-10-CM | POA: Diagnosis not present

## 2017-01-03 DIAGNOSIS — M0589 Other rheumatoid arthritis with rheumatoid factor of multiple sites: Secondary | ICD-10-CM | POA: Diagnosis not present

## 2017-01-03 DIAGNOSIS — Z79899 Other long term (current) drug therapy: Secondary | ICD-10-CM | POA: Diagnosis not present

## 2017-01-03 DIAGNOSIS — Z6823 Body mass index (BMI) 23.0-23.9, adult: Secondary | ICD-10-CM | POA: Diagnosis not present

## 2017-01-18 DIAGNOSIS — R5383 Other fatigue: Secondary | ICD-10-CM | POA: Diagnosis not present

## 2017-01-18 DIAGNOSIS — K5909 Other constipation: Secondary | ICD-10-CM | POA: Diagnosis not present

## 2017-01-18 DIAGNOSIS — M79671 Pain in right foot: Secondary | ICD-10-CM | POA: Diagnosis not present

## 2017-01-18 DIAGNOSIS — M79672 Pain in left foot: Secondary | ICD-10-CM | POA: Diagnosis not present

## 2017-01-19 DIAGNOSIS — I10 Essential (primary) hypertension: Secondary | ICD-10-CM | POA: Diagnosis not present

## 2017-01-19 DIAGNOSIS — K5909 Other constipation: Secondary | ICD-10-CM | POA: Diagnosis not present

## 2017-01-19 DIAGNOSIS — E039 Hypothyroidism, unspecified: Secondary | ICD-10-CM | POA: Diagnosis not present

## 2017-01-19 DIAGNOSIS — R5383 Other fatigue: Secondary | ICD-10-CM | POA: Diagnosis not present

## 2017-02-01 DIAGNOSIS — M14671 Charcot's joint, right ankle and foot: Secondary | ICD-10-CM | POA: Diagnosis not present

## 2017-02-01 DIAGNOSIS — S92325D Nondisplaced fracture of second metatarsal bone, left foot, subsequent encounter for fracture with routine healing: Secondary | ICD-10-CM | POA: Diagnosis not present

## 2017-02-07 DIAGNOSIS — Z01411 Encounter for gynecological examination (general) (routine) with abnormal findings: Secondary | ICD-10-CM | POA: Diagnosis not present

## 2017-02-07 DIAGNOSIS — N949 Unspecified condition associated with female genital organs and menstrual cycle: Secondary | ICD-10-CM | POA: Diagnosis not present

## 2017-02-07 DIAGNOSIS — Z79890 Hormone replacement therapy (postmenopausal): Secondary | ICD-10-CM | POA: Diagnosis not present

## 2017-02-21 DIAGNOSIS — N949 Unspecified condition associated with female genital organs and menstrual cycle: Secondary | ICD-10-CM | POA: Diagnosis not present

## 2017-03-03 DIAGNOSIS — M79642 Pain in left hand: Secondary | ICD-10-CM | POA: Diagnosis not present

## 2017-03-03 DIAGNOSIS — M2550 Pain in unspecified joint: Secondary | ICD-10-CM | POA: Diagnosis not present

## 2017-03-03 DIAGNOSIS — R5383 Other fatigue: Secondary | ICD-10-CM | POA: Diagnosis not present

## 2017-03-03 DIAGNOSIS — I7300 Raynaud's syndrome without gangrene: Secondary | ICD-10-CM | POA: Diagnosis not present

## 2017-03-03 DIAGNOSIS — M0600 Rheumatoid arthritis without rheumatoid factor, unspecified site: Secondary | ICD-10-CM | POA: Diagnosis not present

## 2017-03-03 DIAGNOSIS — M150 Primary generalized (osteo)arthritis: Secondary | ICD-10-CM | POA: Diagnosis not present

## 2017-03-03 DIAGNOSIS — M79641 Pain in right hand: Secondary | ICD-10-CM | POA: Diagnosis not present

## 2017-03-03 DIAGNOSIS — Z6822 Body mass index (BMI) 22.0-22.9, adult: Secondary | ICD-10-CM | POA: Diagnosis not present

## 2017-03-17 DIAGNOSIS — M0609 Rheumatoid arthritis without rheumatoid factor, multiple sites: Secondary | ICD-10-CM | POA: Diagnosis not present

## 2017-04-07 DIAGNOSIS — F902 Attention-deficit hyperactivity disorder, combined type: Secondary | ICD-10-CM | POA: Diagnosis not present

## 2017-04-14 DIAGNOSIS — M0609 Rheumatoid arthritis without rheumatoid factor, multiple sites: Secondary | ICD-10-CM | POA: Diagnosis not present

## 2017-05-13 DIAGNOSIS — M0589 Other rheumatoid arthritis with rheumatoid factor of multiple sites: Secondary | ICD-10-CM | POA: Diagnosis not present

## 2017-05-25 DIAGNOSIS — M14671 Charcot's joint, right ankle and foot: Secondary | ICD-10-CM | POA: Diagnosis not present

## 2017-05-25 DIAGNOSIS — S92325D Nondisplaced fracture of second metatarsal bone, left foot, subsequent encounter for fracture with routine healing: Secondary | ICD-10-CM | POA: Diagnosis not present

## 2017-05-25 DIAGNOSIS — Z23 Encounter for immunization: Secondary | ICD-10-CM | POA: Diagnosis not present

## 2017-06-10 DIAGNOSIS — M0609 Rheumatoid arthritis without rheumatoid factor, multiple sites: Secondary | ICD-10-CM | POA: Diagnosis not present

## 2017-06-13 DIAGNOSIS — M79671 Pain in right foot: Secondary | ICD-10-CM | POA: Diagnosis not present

## 2017-06-13 DIAGNOSIS — M79672 Pain in left foot: Secondary | ICD-10-CM | POA: Diagnosis not present

## 2017-06-27 DIAGNOSIS — L03011 Cellulitis of right finger: Secondary | ICD-10-CM | POA: Diagnosis not present

## 2017-06-27 DIAGNOSIS — E039 Hypothyroidism, unspecified: Secondary | ICD-10-CM | POA: Diagnosis not present

## 2017-06-27 DIAGNOSIS — M7989 Other specified soft tissue disorders: Secondary | ICD-10-CM | POA: Diagnosis not present

## 2017-06-28 DIAGNOSIS — I7300 Raynaud's syndrome without gangrene: Secondary | ICD-10-CM | POA: Diagnosis not present

## 2017-06-28 DIAGNOSIS — M2550 Pain in unspecified joint: Secondary | ICD-10-CM | POA: Diagnosis not present

## 2017-06-28 DIAGNOSIS — R5383 Other fatigue: Secondary | ICD-10-CM | POA: Diagnosis not present

## 2017-06-28 DIAGNOSIS — M150 Primary generalized (osteo)arthritis: Secondary | ICD-10-CM | POA: Diagnosis not present

## 2017-06-28 DIAGNOSIS — Z6823 Body mass index (BMI) 23.0-23.9, adult: Secondary | ICD-10-CM | POA: Diagnosis not present

## 2017-06-28 DIAGNOSIS — M0609 Rheumatoid arthritis without rheumatoid factor, multiple sites: Secondary | ICD-10-CM | POA: Diagnosis not present

## 2017-07-08 DIAGNOSIS — L4059 Other psoriatic arthropathy: Secondary | ICD-10-CM | POA: Diagnosis not present

## 2017-07-08 DIAGNOSIS — Z79899 Other long term (current) drug therapy: Secondary | ICD-10-CM | POA: Diagnosis not present

## 2017-08-05 DIAGNOSIS — L4059 Other psoriatic arthropathy: Secondary | ICD-10-CM | POA: Diagnosis not present

## 2017-08-10 DIAGNOSIS — F902 Attention-deficit hyperactivity disorder, combined type: Secondary | ICD-10-CM | POA: Diagnosis not present

## 2017-09-02 DIAGNOSIS — M79671 Pain in right foot: Secondary | ICD-10-CM | POA: Diagnosis not present

## 2017-09-02 DIAGNOSIS — L97519 Non-pressure chronic ulcer of other part of right foot with unspecified severity: Secondary | ICD-10-CM | POA: Diagnosis not present

## 2017-09-02 DIAGNOSIS — M14671 Charcot's joint, right ankle and foot: Secondary | ICD-10-CM | POA: Diagnosis not present

## 2017-09-22 DIAGNOSIS — Z6823 Body mass index (BMI) 23.0-23.9, adult: Secondary | ICD-10-CM | POA: Diagnosis not present

## 2017-09-22 DIAGNOSIS — L4059 Other psoriatic arthropathy: Secondary | ICD-10-CM | POA: Diagnosis not present

## 2017-09-22 DIAGNOSIS — M2550 Pain in unspecified joint: Secondary | ICD-10-CM | POA: Diagnosis not present

## 2017-09-22 DIAGNOSIS — M150 Primary generalized (osteo)arthritis: Secondary | ICD-10-CM | POA: Diagnosis not present

## 2017-09-22 DIAGNOSIS — M0609 Rheumatoid arthritis without rheumatoid factor, multiple sites: Secondary | ICD-10-CM | POA: Diagnosis not present

## 2017-09-22 DIAGNOSIS — I7300 Raynaud's syndrome without gangrene: Secondary | ICD-10-CM | POA: Diagnosis not present

## 2017-10-21 DIAGNOSIS — L4059 Other psoriatic arthropathy: Secondary | ICD-10-CM | POA: Diagnosis not present

## 2017-11-03 DIAGNOSIS — L4059 Other psoriatic arthropathy: Secondary | ICD-10-CM | POA: Diagnosis not present

## 2017-11-10 DIAGNOSIS — M5136 Other intervertebral disc degeneration, lumbar region: Secondary | ICD-10-CM | POA: Diagnosis not present

## 2017-11-10 DIAGNOSIS — M76891 Other specified enthesopathies of right lower limb, excluding foot: Secondary | ICD-10-CM | POA: Diagnosis not present

## 2017-11-10 DIAGNOSIS — M8580 Other specified disorders of bone density and structure, unspecified site: Secondary | ICD-10-CM | POA: Diagnosis not present

## 2017-11-10 DIAGNOSIS — Z96643 Presence of artificial hip joint, bilateral: Secondary | ICD-10-CM | POA: Diagnosis not present

## 2017-11-10 DIAGNOSIS — M160 Bilateral primary osteoarthritis of hip: Secondary | ICD-10-CM | POA: Diagnosis not present

## 2017-11-22 DIAGNOSIS — F902 Attention-deficit hyperactivity disorder, combined type: Secondary | ICD-10-CM | POA: Diagnosis not present

## 2017-11-23 DIAGNOSIS — E039 Hypothyroidism, unspecified: Secondary | ICD-10-CM | POA: Diagnosis not present

## 2017-11-23 DIAGNOSIS — M25552 Pain in left hip: Secondary | ICD-10-CM | POA: Diagnosis not present

## 2017-11-23 DIAGNOSIS — M2550 Pain in unspecified joint: Secondary | ICD-10-CM | POA: Diagnosis not present

## 2017-11-23 DIAGNOSIS — Z79890 Hormone replacement therapy (postmenopausal): Secondary | ICD-10-CM | POA: Diagnosis not present

## 2017-11-23 DIAGNOSIS — E559 Vitamin D deficiency, unspecified: Secondary | ICD-10-CM | POA: Diagnosis not present

## 2017-11-23 DIAGNOSIS — F900 Attention-deficit hyperactivity disorder, predominantly inattentive type: Secondary | ICD-10-CM | POA: Diagnosis not present

## 2017-11-23 DIAGNOSIS — M85852 Other specified disorders of bone density and structure, left thigh: Secondary | ICD-10-CM | POA: Diagnosis not present

## 2017-11-23 DIAGNOSIS — Z1389 Encounter for screening for other disorder: Secondary | ICD-10-CM | POA: Diagnosis not present

## 2017-11-23 DIAGNOSIS — M069 Rheumatoid arthritis, unspecified: Secondary | ICD-10-CM | POA: Diagnosis not present

## 2017-11-23 DIAGNOSIS — R1013 Epigastric pain: Secondary | ICD-10-CM | POA: Diagnosis not present

## 2017-11-23 DIAGNOSIS — Z79899 Other long term (current) drug therapy: Secondary | ICD-10-CM | POA: Diagnosis not present

## 2017-11-23 DIAGNOSIS — I10 Essential (primary) hypertension: Secondary | ICD-10-CM | POA: Diagnosis not present

## 2017-11-23 DIAGNOSIS — Z23 Encounter for immunization: Secondary | ICD-10-CM | POA: Diagnosis not present

## 2017-12-06 DIAGNOSIS — M7071 Other bursitis of hip, right hip: Secondary | ICD-10-CM | POA: Diagnosis not present

## 2017-12-09 DIAGNOSIS — M0609 Rheumatoid arthritis without rheumatoid factor, multiple sites: Secondary | ICD-10-CM | POA: Diagnosis not present

## 2017-12-09 DIAGNOSIS — M2550 Pain in unspecified joint: Secondary | ICD-10-CM | POA: Diagnosis not present

## 2017-12-09 DIAGNOSIS — I7300 Raynaud's syndrome without gangrene: Secondary | ICD-10-CM | POA: Diagnosis not present

## 2017-12-09 DIAGNOSIS — Z6822 Body mass index (BMI) 22.0-22.9, adult: Secondary | ICD-10-CM | POA: Diagnosis not present

## 2017-12-09 DIAGNOSIS — L4059 Other psoriatic arthropathy: Secondary | ICD-10-CM | POA: Diagnosis not present

## 2017-12-09 DIAGNOSIS — M150 Primary generalized (osteo)arthritis: Secondary | ICD-10-CM | POA: Diagnosis not present

## 2017-12-16 DIAGNOSIS — M79604 Pain in right leg: Secondary | ICD-10-CM | POA: Diagnosis not present

## 2017-12-21 DIAGNOSIS — M25559 Pain in unspecified hip: Secondary | ICD-10-CM | POA: Diagnosis not present

## 2017-12-21 DIAGNOSIS — M1611 Unilateral primary osteoarthritis, right hip: Secondary | ICD-10-CM | POA: Diagnosis not present

## 2018-01-24 DIAGNOSIS — M1611 Unilateral primary osteoarthritis, right hip: Secondary | ICD-10-CM | POA: Diagnosis not present

## 2018-01-24 DIAGNOSIS — M7071 Other bursitis of hip, right hip: Secondary | ICD-10-CM | POA: Diagnosis not present

## 2018-01-26 DIAGNOSIS — L4059 Other psoriatic arthropathy: Secondary | ICD-10-CM | POA: Diagnosis not present

## 2018-03-01 DIAGNOSIS — F902 Attention-deficit hyperactivity disorder, combined type: Secondary | ICD-10-CM | POA: Diagnosis not present

## 2018-03-14 DIAGNOSIS — M0609 Rheumatoid arthritis without rheumatoid factor, multiple sites: Secondary | ICD-10-CM | POA: Diagnosis not present

## 2018-03-14 DIAGNOSIS — M2550 Pain in unspecified joint: Secondary | ICD-10-CM | POA: Diagnosis not present

## 2018-03-14 DIAGNOSIS — L4059 Other psoriatic arthropathy: Secondary | ICD-10-CM | POA: Diagnosis not present

## 2018-03-14 DIAGNOSIS — M150 Primary generalized (osteo)arthritis: Secondary | ICD-10-CM | POA: Diagnosis not present

## 2018-03-14 DIAGNOSIS — I7300 Raynaud's syndrome without gangrene: Secondary | ICD-10-CM | POA: Diagnosis not present

## 2018-03-14 DIAGNOSIS — Z6821 Body mass index (BMI) 21.0-21.9, adult: Secondary | ICD-10-CM | POA: Diagnosis not present

## 2018-03-28 ENCOUNTER — Other Ambulatory Visit: Payer: Self-pay | Admitting: Obstetrics & Gynecology

## 2018-03-28 DIAGNOSIS — Z1231 Encounter for screening mammogram for malignant neoplasm of breast: Secondary | ICD-10-CM

## 2018-04-20 DIAGNOSIS — L4059 Other psoriatic arthropathy: Secondary | ICD-10-CM | POA: Diagnosis not present

## 2018-04-27 ENCOUNTER — Ambulatory Visit
Admission: RE | Admit: 2018-04-27 | Discharge: 2018-04-27 | Disposition: A | Discharge status: Home / Self Care | Payer: Medicare Other | Source: Ambulatory Visit | Attending: Obstetrics & Gynecology | Admitting: Obstetrics & Gynecology

## 2018-04-27 DIAGNOSIS — Z1231 Encounter for screening mammogram for malignant neoplasm of breast: Secondary | ICD-10-CM

## 2018-04-27 DIAGNOSIS — Z23 Encounter for immunization: Secondary | ICD-10-CM | POA: Diagnosis not present

## 2018-04-28 ENCOUNTER — Other Ambulatory Visit: Payer: Self-pay | Admitting: Obstetrics & Gynecology

## 2018-04-28 DIAGNOSIS — R928 Other abnormal and inconclusive findings on diagnostic imaging of breast: Secondary | ICD-10-CM

## 2018-05-12 DIAGNOSIS — Z79899 Other long term (current) drug therapy: Secondary | ICD-10-CM | POA: Diagnosis not present

## 2018-05-12 DIAGNOSIS — M8588 Other specified disorders of bone density and structure, other site: Secondary | ICD-10-CM | POA: Diagnosis not present

## 2018-05-12 DIAGNOSIS — M14671 Charcot's joint, right ankle and foot: Secondary | ICD-10-CM | POA: Diagnosis not present

## 2018-05-12 DIAGNOSIS — M2550 Pain in unspecified joint: Secondary | ICD-10-CM | POA: Diagnosis not present

## 2018-05-12 DIAGNOSIS — I10 Essential (primary) hypertension: Secondary | ICD-10-CM | POA: Diagnosis not present

## 2018-05-12 DIAGNOSIS — Z79890 Hormone replacement therapy (postmenopausal): Secondary | ICD-10-CM | POA: Diagnosis not present

## 2018-05-12 DIAGNOSIS — M85852 Other specified disorders of bone density and structure, left thigh: Secondary | ICD-10-CM | POA: Diagnosis not present

## 2018-05-12 DIAGNOSIS — M069 Rheumatoid arthritis, unspecified: Secondary | ICD-10-CM | POA: Diagnosis not present

## 2018-05-12 DIAGNOSIS — E559 Vitamin D deficiency, unspecified: Secondary | ICD-10-CM | POA: Diagnosis not present

## 2018-05-12 DIAGNOSIS — Z0000 Encounter for general adult medical examination without abnormal findings: Secondary | ICD-10-CM | POA: Diagnosis not present

## 2018-05-12 DIAGNOSIS — E039 Hypothyroidism, unspecified: Secondary | ICD-10-CM | POA: Diagnosis not present

## 2018-05-12 DIAGNOSIS — R1013 Epigastric pain: Secondary | ICD-10-CM | POA: Diagnosis not present

## 2018-05-12 DIAGNOSIS — F900 Attention-deficit hyperactivity disorder, predominantly inattentive type: Secondary | ICD-10-CM | POA: Diagnosis not present

## 2018-05-26 ENCOUNTER — Other Ambulatory Visit: Payer: Self-pay | Admitting: Family Medicine

## 2018-05-26 DIAGNOSIS — M8588 Other specified disorders of bone density and structure, other site: Secondary | ICD-10-CM

## 2018-05-30 DIAGNOSIS — Z79890 Hormone replacement therapy (postmenopausal): Secondary | ICD-10-CM | POA: Diagnosis not present

## 2018-05-30 DIAGNOSIS — Z01419 Encounter for gynecological examination (general) (routine) without abnormal findings: Secondary | ICD-10-CM | POA: Diagnosis not present

## 2018-06-14 DIAGNOSIS — M0589 Other rheumatoid arthritis with rheumatoid factor of multiple sites: Secondary | ICD-10-CM | POA: Diagnosis not present

## 2018-06-14 DIAGNOSIS — M2550 Pain in unspecified joint: Secondary | ICD-10-CM | POA: Diagnosis not present

## 2018-06-14 DIAGNOSIS — M150 Primary generalized (osteo)arthritis: Secondary | ICD-10-CM | POA: Diagnosis not present

## 2018-06-14 DIAGNOSIS — Z79899 Other long term (current) drug therapy: Secondary | ICD-10-CM | POA: Diagnosis not present

## 2018-06-14 DIAGNOSIS — L4050 Arthropathic psoriasis, unspecified: Secondary | ICD-10-CM | POA: Diagnosis not present

## 2018-06-14 DIAGNOSIS — I7300 Raynaud's syndrome without gangrene: Secondary | ICD-10-CM | POA: Diagnosis not present

## 2018-06-14 DIAGNOSIS — Z6821 Body mass index (BMI) 21.0-21.9, adult: Secondary | ICD-10-CM | POA: Diagnosis not present

## 2018-06-21 DIAGNOSIS — F902 Attention-deficit hyperactivity disorder, combined type: Secondary | ICD-10-CM | POA: Diagnosis not present

## 2018-07-13 DIAGNOSIS — L4059 Other psoriatic arthropathy: Secondary | ICD-10-CM | POA: Diagnosis not present

## 2018-08-24 DIAGNOSIS — M25551 Pain in right hip: Secondary | ICD-10-CM | POA: Diagnosis not present

## 2018-08-24 DIAGNOSIS — R58 Hemorrhage, not elsewhere classified: Secondary | ICD-10-CM | POA: Diagnosis not present

## 2018-08-27 DIAGNOSIS — G8929 Other chronic pain: Secondary | ICD-10-CM | POA: Diagnosis not present

## 2018-08-27 DIAGNOSIS — M545 Low back pain: Secondary | ICD-10-CM | POA: Diagnosis not present

## 2018-08-27 DIAGNOSIS — M48061 Spinal stenosis, lumbar region without neurogenic claudication: Secondary | ICD-10-CM | POA: Diagnosis not present

## 2018-08-27 DIAGNOSIS — M47816 Spondylosis without myelopathy or radiculopathy, lumbar region: Secondary | ICD-10-CM | POA: Diagnosis not present

## 2018-08-27 DIAGNOSIS — M549 Dorsalgia, unspecified: Secondary | ICD-10-CM | POA: Diagnosis not present

## 2018-08-27 DIAGNOSIS — M4317 Spondylolisthesis, lumbosacral region: Secondary | ICD-10-CM | POA: Diagnosis not present

## 2018-08-29 DIAGNOSIS — M0589 Other rheumatoid arthritis with rheumatoid factor of multiple sites: Secondary | ICD-10-CM | POA: Diagnosis not present

## 2018-08-29 DIAGNOSIS — M150 Primary generalized (osteo)arthritis: Secondary | ICD-10-CM | POA: Diagnosis not present

## 2018-08-29 DIAGNOSIS — Z6821 Body mass index (BMI) 21.0-21.9, adult: Secondary | ICD-10-CM | POA: Diagnosis not present

## 2018-08-29 DIAGNOSIS — M2550 Pain in unspecified joint: Secondary | ICD-10-CM | POA: Diagnosis not present

## 2018-08-29 DIAGNOSIS — L4050 Arthropathic psoriasis, unspecified: Secondary | ICD-10-CM | POA: Diagnosis not present

## 2018-08-29 DIAGNOSIS — Z79899 Other long term (current) drug therapy: Secondary | ICD-10-CM | POA: Diagnosis not present

## 2018-08-29 DIAGNOSIS — I7300 Raynaud's syndrome without gangrene: Secondary | ICD-10-CM | POA: Diagnosis not present

## 2018-08-31 DIAGNOSIS — M5416 Radiculopathy, lumbar region: Secondary | ICD-10-CM | POA: Diagnosis not present

## 2018-08-31 DIAGNOSIS — G8929 Other chronic pain: Secondary | ICD-10-CM | POA: Diagnosis not present

## 2018-08-31 DIAGNOSIS — M41126 Adolescent idiopathic scoliosis, lumbar region: Secondary | ICD-10-CM | POA: Diagnosis not present

## 2018-08-31 DIAGNOSIS — M545 Low back pain: Secondary | ICD-10-CM | POA: Diagnosis not present

## 2018-08-31 DIAGNOSIS — M4316 Spondylolisthesis, lumbar region: Secondary | ICD-10-CM | POA: Diagnosis not present

## 2018-09-06 ENCOUNTER — Encounter: Payer: Self-pay | Admitting: Family Medicine

## 2018-09-06 ENCOUNTER — Ambulatory Visit (INDEPENDENT_AMBULATORY_CARE_PROVIDER_SITE_OTHER): Payer: Medicare Other | Admitting: Family Medicine

## 2018-09-06 ENCOUNTER — Ambulatory Visit: Payer: Self-pay

## 2018-09-06 VITALS — BP 137/96 | HR 88 | Ht 64.0 in | Wt 125.0 lb

## 2018-09-06 DIAGNOSIS — M25511 Pain in right shoulder: Secondary | ICD-10-CM

## 2018-09-06 MED ORDER — METHYLPREDNISOLONE ACETATE 40 MG/ML IJ SUSP
40.0000 mg | Freq: Once | INTRAMUSCULAR | Status: AC
  Administered 2018-09-06: 40 mg

## 2018-09-06 NOTE — Patient Instructions (Signed)
You have torn your supraspinatus and infraspinatus muscles of your shoulder with a large amount of bursitis - this appears old and retracted. Aleve or ibuprofen only if needed. Can take tylenol in addition to this. Subacromial injection may be beneficial to help with pain and to decrease inflammation - you were given this today. Consider physical therapy with transition to home exercise program. Do home exercise program with theraband and scapular stabilization exercises daily 3 sets of 10 once a day. If not improving at follow-up we will consider physical therapy, and/or nitro patches. Follow up with me in 6 weeks but you can call me in a week if you want to do physical therapy for the spasmed muscle.

## 2018-09-08 ENCOUNTER — Encounter: Payer: Self-pay | Admitting: Family Medicine

## 2018-09-08 DIAGNOSIS — M5441 Lumbago with sciatica, right side: Secondary | ICD-10-CM | POA: Diagnosis not present

## 2018-09-08 NOTE — Progress Notes (Signed)
PCP: Cari Caraway, MD  Subjective:   HPI: Patient is a 68 y.o. female here for right shoulder pain.  Patient reports she's done well until mid-January when pain in lateral right shoulder started again. No acute injury or trauma. Tried to swim a little the other day and felt more weak and painful. Pain level 5-6/10 and sharp with overhead motions, lifting. Feels this also in right trap area. No skin changes, numbness. Has been icing only.  Past Medical History:  Diagnosis Date  . Rheumatoid arthritis ([place])     Current Outpatient Medications on File Prior to Visit  Medication Sig Dispense Refill  . amphetamine-dextroamphetamine (ADDERALL) 20 MG tablet     . Ascorbic Acid (VITAMIN C) 500 MG CHEW Chew by mouth.    . B Complex Vitamins (VITAMIN-B COMPLEX) TABS Take by mouth.    . calcium carbonate 200 MG capsule Take 250 mg by mouth daily.    . Cholecalciferol (VITAMIN D-1000 MAX ST) 1000 UNITS tablet Take by mouth.    . diazepam (VALIUM) 5 MG tablet TAKE 1 TO 2 TABLETS BY MOUTH ONCE DAILY AS NEEDED    . estradiol (ESTRACE) 1 MG tablet   0  . fexofenadine (ALLEGRA) 180 MG tablet Take by mouth.    . fluticasone (FLONASE) 50 MCG/ACT nasal spray     . folic acid (FOLVITE) 1 MG tablet     . gabapentin (NEURONTIN) 100 MG capsule TAKE ONE CAPSULE BY MOUTH 5 TIMES DAILY. TAKE TWO AT NOON & THREE AT NIGHT    . gabapentin (NEURONTIN) 600 MG tablet TAKE 1 2 TO 2 TABLETS BY MOUTH NIGHTLY    . hydroxychloroquine (PLAQUENIL) 200 MG tablet   0  . leflunomide (ARAVA) 20 MG tablet Take 20 mg by mouth daily.    Marland Kitchen levothyroxine (SYNTHROID, LEVOTHROID) 75 MCG tablet Take 1 tablet (75 mcg total) by mouth daily before breakfast. **PT NEEDS FOLLOW UP APPT** 45 tablet 0  . losartan (COZAAR) 50 MG tablet     . Multiple Vitamin (MULTIVITAMIN) capsule Take by mouth.    Marland Kitchen NIFEdipine (PROCARDIA-XL/ADALAT-CC/NIFEDICAL-XL) 30 MG 24 hr tablet     . tretinoin (RETIN-A) 0.1 % cream Apply topically.    Marland Kitchen  zolpidem (AMBIEN) 10 MG tablet Take by mouth.     No current facility-administered medications on file prior to visit.     Past Surgical History:  Procedure Laterality Date  . BREAST BIOPSY Left    pt not sure of date.  Marland Kitchen HIP SURGERY    . SHOULDER SURGERY      Allergies  Allergen Reactions  . Vancomycin Dermatitis    Red, warm, coughing, anxious reaction    Social History   Socioeconomic History  . Marital status: Unknown    Spouse name: Not on file  . Number of children: 1  . Years of education: Not on file  . Highest education level: Not on file  Occupational History  . Occupation: Radiation protection practitioner  . Financial resource strain: Not on file  . Food insecurity:    Worry: Not on file    Inability: Not on file  . Transportation needs:    Medical: Not on file    Non-medical: Not on file  Tobacco Use  . Smoking status: Former Research scientist (life sciences)  . Smokeless tobacco: Never Used  Substance and Sexual Activity  . Alcohol use: Yes    Alcohol/week: 7.0 standard drinks    Types: 7 Standard drinks or equivalent per week  .  Drug use: No  . Sexual activity: Not on file  Lifestyle  . Physical activity:    Days per week: Not on file    Minutes per session: Not on file  . Stress: Not on file  Relationships  . Social connections:    Talks on phone: Not on file    Gets together: Not on file    Attends religious service: Not on file    Active member of club or organization: Not on file    Attends meetings of clubs or organizations: Not on file    Relationship status: Not on file  . Intimate partner violence:    Fear of current or ex partner: Not on file    Emotionally abused: Not on file    Physically abused: Not on file    Forced sexual activity: Not on file  Other Topics Concern  . 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 No pertinent family history.  BP (!) 137/96   Pulse 88   Ht 5\' 4"  (1.626 m)   Wt 125 lb (56.7 kg)   BMI 21.46 kg/m   Review of Systems: See HPI above.     Objective:  Physical Exam:  Gen: NAD, comfortable in exam room  Right shoulder: No swelling, ecchymoses.  No gross deformity. No TTP. FROM. Positive Hawkins, Neers. Negative Yergasons. Strength 4/5 with empty can and 3/5 resisted external rotation, 5/5 IR. Negative apprehension. NV intact distally.  Left shoulder: No swelling, ecchymoses.  No gross deformity. No TTP. FROM. Strength 5/5 with empty can and resisted internal/external rotation. NV intact distally.  Limited MSK u/s right shoulder: supraspinatus and infraspinatus with full thickness tears and retraction.  Large subacromial bursitis.  High riding glenohumeral head to acromion.  Assessment & Plan:  1. Right shoulder pain - old full thickness supraspinatus and infraspinatus tears without new injury.  Surprising she can lift arm overhead fully and has level of strength she does with these findings.  Discussed this is irreparable.  Aleve or ibuprofen if needed.  Subacromial injection given today.  Shown home exercises.  Consider PT, nitro patches.  F/u in 6 weeks.  After informed written consent timeout was performed, patient was seated in chair in exam room. Right shoulder was prepped with alcohol swab and utilizing lateral approach with ultrasound guidance, patient's right subacromial space was injected with 3:1 bupivicaine: depomedrol. Patient tolerated the procedure well without immediate complications.

## 2018-09-11 ENCOUNTER — Telehealth: Payer: Self-pay | Admitting: Family Medicine

## 2018-09-11 NOTE — Telephone Encounter (Signed)
Noted, thanks!

## 2018-09-11 NOTE — Telephone Encounter (Signed)
She may still get relief from this as it likely hasn't fully kicked in yet.  I'd tell her to start working out again with upper body on Wednesday.  Next steps would be physical therapy and/or nitro patches. Wouldn't recommend repeat injection for 3 months.

## 2018-09-11 NOTE — Telephone Encounter (Signed)
Patient calling with an update regarding injection received last week. She states she has not had 100% relief. She feels better today but states she also hasn't done anything today.   Patient asking if another injection can be given and/or what the next step will be? She also asked if she can start working out again

## 2018-09-11 NOTE — Telephone Encounter (Signed)
Spoke with patient and she is going to hold off on physical therapy for now.  She thinks she has Nitro patches left from a previous prescription and will start using those. If she needs more she will call back.

## 2018-09-12 DIAGNOSIS — M545 Low back pain: Secondary | ICD-10-CM | POA: Diagnosis not present

## 2018-09-12 DIAGNOSIS — G8929 Other chronic pain: Secondary | ICD-10-CM | POA: Diagnosis not present

## 2018-09-12 DIAGNOSIS — M25551 Pain in right hip: Secondary | ICD-10-CM | POA: Diagnosis not present

## 2018-09-14 DIAGNOSIS — M5416 Radiculopathy, lumbar region: Secondary | ICD-10-CM | POA: Diagnosis not present

## 2018-09-18 DIAGNOSIS — M5441 Lumbago with sciatica, right side: Secondary | ICD-10-CM | POA: Diagnosis not present

## 2018-09-25 DIAGNOSIS — F902 Attention-deficit hyperactivity disorder, combined type: Secondary | ICD-10-CM | POA: Diagnosis not present

## 2018-10-02 DIAGNOSIS — M5416 Radiculopathy, lumbar region: Secondary | ICD-10-CM | POA: Diagnosis not present

## 2018-10-03 DIAGNOSIS — M4316 Spondylolisthesis, lumbar region: Secondary | ICD-10-CM | POA: Diagnosis not present

## 2018-10-03 DIAGNOSIS — M545 Low back pain: Secondary | ICD-10-CM | POA: Diagnosis not present

## 2018-10-03 DIAGNOSIS — M41126 Adolescent idiopathic scoliosis, lumbar region: Secondary | ICD-10-CM | POA: Diagnosis not present

## 2018-10-11 DIAGNOSIS — Z6821 Body mass index (BMI) 21.0-21.9, adult: Secondary | ICD-10-CM | POA: Diagnosis not present

## 2018-10-11 DIAGNOSIS — M545 Low back pain: Secondary | ICD-10-CM | POA: Diagnosis not present

## 2018-10-11 DIAGNOSIS — Z79899 Other long term (current) drug therapy: Secondary | ICD-10-CM | POA: Diagnosis not present

## 2018-10-11 DIAGNOSIS — M0609 Rheumatoid arthritis without rheumatoid factor, multiple sites: Secondary | ICD-10-CM | POA: Diagnosis not present

## 2018-10-11 DIAGNOSIS — I7300 Raynaud's syndrome without gangrene: Secondary | ICD-10-CM | POA: Diagnosis not present

## 2018-10-11 DIAGNOSIS — M2550 Pain in unspecified joint: Secondary | ICD-10-CM | POA: Diagnosis not present

## 2018-10-11 DIAGNOSIS — M41126 Adolescent idiopathic scoliosis, lumbar region: Secondary | ICD-10-CM | POA: Diagnosis not present

## 2018-10-11 DIAGNOSIS — M4316 Spondylolisthesis, lumbar region: Secondary | ICD-10-CM | POA: Diagnosis not present

## 2018-10-11 DIAGNOSIS — M150 Primary generalized (osteo)arthritis: Secondary | ICD-10-CM | POA: Diagnosis not present

## 2018-10-11 DIAGNOSIS — L4050 Arthropathic psoriasis, unspecified: Secondary | ICD-10-CM | POA: Diagnosis not present

## 2018-10-12 DIAGNOSIS — L4059 Other psoriatic arthropathy: Secondary | ICD-10-CM | POA: Diagnosis not present

## 2018-10-13 DIAGNOSIS — M41126 Adolescent idiopathic scoliosis, lumbar region: Secondary | ICD-10-CM | POA: Diagnosis not present

## 2018-10-13 DIAGNOSIS — M4316 Spondylolisthesis, lumbar region: Secondary | ICD-10-CM | POA: Diagnosis not present

## 2018-10-13 DIAGNOSIS — M545 Low back pain: Secondary | ICD-10-CM | POA: Diagnosis not present

## 2018-10-18 DIAGNOSIS — M545 Low back pain: Secondary | ICD-10-CM | POA: Diagnosis not present

## 2018-10-18 DIAGNOSIS — M41126 Adolescent idiopathic scoliosis, lumbar region: Secondary | ICD-10-CM | POA: Diagnosis not present

## 2018-10-18 DIAGNOSIS — M4316 Spondylolisthesis, lumbar region: Secondary | ICD-10-CM | POA: Diagnosis not present

## 2018-10-20 DIAGNOSIS — M41126 Adolescent idiopathic scoliosis, lumbar region: Secondary | ICD-10-CM | POA: Diagnosis not present

## 2018-10-20 DIAGNOSIS — M545 Low back pain: Secondary | ICD-10-CM | POA: Diagnosis not present

## 2018-10-20 DIAGNOSIS — M4316 Spondylolisthesis, lumbar region: Secondary | ICD-10-CM | POA: Diagnosis not present

## 2018-10-23 DIAGNOSIS — M545 Low back pain: Secondary | ICD-10-CM | POA: Diagnosis not present

## 2018-10-23 DIAGNOSIS — M41126 Adolescent idiopathic scoliosis, lumbar region: Secondary | ICD-10-CM | POA: Diagnosis not present

## 2018-10-23 DIAGNOSIS — M4316 Spondylolisthesis, lumbar region: Secondary | ICD-10-CM | POA: Diagnosis not present

## 2018-10-26 DIAGNOSIS — M41126 Adolescent idiopathic scoliosis, lumbar region: Secondary | ICD-10-CM | POA: Diagnosis not present

## 2018-10-26 DIAGNOSIS — M4316 Spondylolisthesis, lumbar region: Secondary | ICD-10-CM | POA: Diagnosis not present

## 2018-10-26 DIAGNOSIS — M545 Low back pain: Secondary | ICD-10-CM | POA: Diagnosis not present

## 2018-11-02 DIAGNOSIS — M48062 Spinal stenosis, lumbar region with neurogenic claudication: Secondary | ICD-10-CM | POA: Diagnosis not present

## 2018-11-06 DIAGNOSIS — M41126 Adolescent idiopathic scoliosis, lumbar region: Secondary | ICD-10-CM | POA: Diagnosis not present

## 2018-11-06 DIAGNOSIS — M4316 Spondylolisthesis, lumbar region: Secondary | ICD-10-CM | POA: Diagnosis not present

## 2018-11-06 DIAGNOSIS — M545 Low back pain: Secondary | ICD-10-CM | POA: Diagnosis not present

## 2018-11-13 DIAGNOSIS — M48062 Spinal stenosis, lumbar region with neurogenic claudication: Secondary | ICD-10-CM | POA: Diagnosis not present

## 2018-11-13 DIAGNOSIS — M4316 Spondylolisthesis, lumbar region: Secondary | ICD-10-CM | POA: Diagnosis not present

## 2018-11-13 DIAGNOSIS — M5416 Radiculopathy, lumbar region: Secondary | ICD-10-CM | POA: Diagnosis not present

## 2018-11-21 DIAGNOSIS — M5416 Radiculopathy, lumbar region: Secondary | ICD-10-CM | POA: Diagnosis not present

## 2018-11-21 DIAGNOSIS — M5136 Other intervertebral disc degeneration, lumbar region: Secondary | ICD-10-CM | POA: Diagnosis not present

## 2018-11-24 DIAGNOSIS — R1013 Epigastric pain: Secondary | ICD-10-CM | POA: Diagnosis not present

## 2018-11-24 DIAGNOSIS — E039 Hypothyroidism, unspecified: Secondary | ICD-10-CM | POA: Diagnosis not present

## 2018-11-24 DIAGNOSIS — M069 Rheumatoid arthritis, unspecified: Secondary | ICD-10-CM | POA: Diagnosis not present

## 2018-11-24 DIAGNOSIS — G8929 Other chronic pain: Secondary | ICD-10-CM | POA: Diagnosis not present

## 2018-11-24 DIAGNOSIS — M14671 Charcot's joint, right ankle and foot: Secondary | ICD-10-CM | POA: Diagnosis not present

## 2018-11-24 DIAGNOSIS — M4807 Spinal stenosis, lumbosacral region: Secondary | ICD-10-CM | POA: Diagnosis not present

## 2018-11-24 DIAGNOSIS — Z131 Encounter for screening for diabetes mellitus: Secondary | ICD-10-CM | POA: Diagnosis not present

## 2018-11-24 DIAGNOSIS — I10 Essential (primary) hypertension: Secondary | ICD-10-CM | POA: Diagnosis not present

## 2018-11-24 DIAGNOSIS — G479 Sleep disorder, unspecified: Secondary | ICD-10-CM | POA: Diagnosis not present

## 2018-11-30 DIAGNOSIS — M5416 Radiculopathy, lumbar region: Secondary | ICD-10-CM | POA: Diagnosis not present

## 2018-12-06 DIAGNOSIS — M14671 Charcot's joint, right ankle and foot: Secondary | ICD-10-CM | POA: Diagnosis not present

## 2018-12-06 DIAGNOSIS — M79671 Pain in right foot: Secondary | ICD-10-CM | POA: Diagnosis not present

## 2018-12-12 DIAGNOSIS — M41126 Adolescent idiopathic scoliosis, lumbar region: Secondary | ICD-10-CM | POA: Diagnosis not present

## 2018-12-12 DIAGNOSIS — M48062 Spinal stenosis, lumbar region with neurogenic claudication: Secondary | ICD-10-CM | POA: Diagnosis not present

## 2018-12-12 DIAGNOSIS — M5416 Radiculopathy, lumbar region: Secondary | ICD-10-CM | POA: Diagnosis not present

## 2018-12-12 DIAGNOSIS — M4316 Spondylolisthesis, lumbar region: Secondary | ICD-10-CM | POA: Diagnosis not present

## 2018-12-13 DIAGNOSIS — M4316 Spondylolisthesis, lumbar region: Secondary | ICD-10-CM | POA: Diagnosis not present

## 2018-12-13 DIAGNOSIS — M48062 Spinal stenosis, lumbar region with neurogenic claudication: Secondary | ICD-10-CM | POA: Diagnosis not present

## 2018-12-13 DIAGNOSIS — M5416 Radiculopathy, lumbar region: Secondary | ICD-10-CM | POA: Diagnosis not present

## 2018-12-15 DIAGNOSIS — M545 Low back pain: Secondary | ICD-10-CM | POA: Diagnosis not present

## 2018-12-15 DIAGNOSIS — F902 Attention-deficit hyperactivity disorder, combined type: Secondary | ICD-10-CM | POA: Diagnosis not present

## 2018-12-15 DIAGNOSIS — M4316 Spondylolisthesis, lumbar region: Secondary | ICD-10-CM | POA: Diagnosis not present

## 2018-12-15 DIAGNOSIS — M41126 Adolescent idiopathic scoliosis, lumbar region: Secondary | ICD-10-CM | POA: Diagnosis not present

## 2018-12-28 DIAGNOSIS — M41126 Adolescent idiopathic scoliosis, lumbar region: Secondary | ICD-10-CM | POA: Diagnosis not present

## 2018-12-28 DIAGNOSIS — M069 Rheumatoid arthritis, unspecified: Secondary | ICD-10-CM | POA: Diagnosis not present

## 2018-12-28 DIAGNOSIS — M4316 Spondylolisthesis, lumbar region: Secondary | ICD-10-CM | POA: Diagnosis not present

## 2018-12-28 DIAGNOSIS — I10 Essential (primary) hypertension: Secondary | ICD-10-CM | POA: Diagnosis not present

## 2018-12-28 DIAGNOSIS — E039 Hypothyroidism, unspecified: Secondary | ICD-10-CM | POA: Diagnosis not present

## 2018-12-28 DIAGNOSIS — M545 Low back pain: Secondary | ICD-10-CM | POA: Diagnosis not present

## 2019-01-10 DIAGNOSIS — G8929 Other chronic pain: Secondary | ICD-10-CM | POA: Diagnosis not present

## 2019-01-10 DIAGNOSIS — M5418 Radiculopathy, sacral and sacrococcygeal region: Secondary | ICD-10-CM | POA: Diagnosis not present

## 2019-01-10 DIAGNOSIS — Z79899 Other long term (current) drug therapy: Secondary | ICD-10-CM | POA: Diagnosis not present

## 2019-01-10 DIAGNOSIS — M5416 Radiculopathy, lumbar region: Secondary | ICD-10-CM | POA: Diagnosis not present

## 2019-01-11 DIAGNOSIS — I7300 Raynaud's syndrome without gangrene: Secondary | ICD-10-CM | POA: Diagnosis not present

## 2019-01-11 DIAGNOSIS — M0609 Rheumatoid arthritis without rheumatoid factor, multiple sites: Secondary | ICD-10-CM | POA: Diagnosis not present

## 2019-01-11 DIAGNOSIS — Z79899 Other long term (current) drug therapy: Secondary | ICD-10-CM | POA: Diagnosis not present

## 2019-01-11 DIAGNOSIS — M2550 Pain in unspecified joint: Secondary | ICD-10-CM | POA: Diagnosis not present

## 2019-01-11 DIAGNOSIS — M150 Primary generalized (osteo)arthritis: Secondary | ICD-10-CM | POA: Diagnosis not present

## 2019-01-11 DIAGNOSIS — L4050 Arthropathic psoriasis, unspecified: Secondary | ICD-10-CM | POA: Diagnosis not present

## 2019-01-15 DIAGNOSIS — L4059 Other psoriatic arthropathy: Secondary | ICD-10-CM | POA: Diagnosis not present

## 2019-01-15 DIAGNOSIS — M0609 Rheumatoid arthritis without rheumatoid factor, multiple sites: Secondary | ICD-10-CM | POA: Diagnosis not present

## 2019-01-16 DIAGNOSIS — M41126 Adolescent idiopathic scoliosis, lumbar region: Secondary | ICD-10-CM | POA: Diagnosis not present

## 2019-01-16 DIAGNOSIS — M545 Low back pain: Secondary | ICD-10-CM | POA: Diagnosis not present

## 2019-01-16 DIAGNOSIS — M4316 Spondylolisthesis, lumbar region: Secondary | ICD-10-CM | POA: Diagnosis not present

## 2019-01-23 DIAGNOSIS — M4316 Spondylolisthesis, lumbar region: Secondary | ICD-10-CM | POA: Diagnosis not present

## 2019-01-23 DIAGNOSIS — M545 Low back pain: Secondary | ICD-10-CM | POA: Diagnosis not present

## 2019-01-23 DIAGNOSIS — M41126 Adolescent idiopathic scoliosis, lumbar region: Secondary | ICD-10-CM | POA: Diagnosis not present

## 2019-01-30 ENCOUNTER — Ambulatory Visit (INDEPENDENT_AMBULATORY_CARE_PROVIDER_SITE_OTHER): Payer: Medicare Other | Admitting: Family Medicine

## 2019-01-30 ENCOUNTER — Other Ambulatory Visit: Payer: Self-pay

## 2019-01-30 ENCOUNTER — Ambulatory Visit: Payer: Self-pay

## 2019-01-30 VITALS — BP 134/94 | Ht 64.0 in | Wt 130.0 lb

## 2019-01-30 DIAGNOSIS — M25511 Pain in right shoulder: Secondary | ICD-10-CM

## 2019-01-30 DIAGNOSIS — M25552 Pain in left hip: Secondary | ICD-10-CM

## 2019-01-30 MED ORDER — METHYLPREDNISOLONE ACETATE 40 MG/ML IJ SUSP
40.0000 mg | Freq: Once | INTRAMUSCULAR | Status: AC
  Administered 2019-01-30: 40 mg via INTRA_ARTICULAR

## 2019-01-30 NOTE — Patient Instructions (Addendum)
Your hip pain is due to overuse of hip flexor and external rotators. I would recommend physical therapy for this. Contact your surgeon about any specific restrictions on stretching though. Tylenol, tramadol as needed.  You have significant subacromial bursitis with retracted old rotator cuff tears. We aspirated and injected this today. Aleve or ibuprofen only if needed. Tylenol, tramadol as needed. Continue with physical therapy. Take it easy with overhead motions, lifting, a lot of swimming for the next 3-5 days after the injection. Follow up with me in 1 month or as needed if you're doing well.

## 2019-01-31 ENCOUNTER — Encounter: Payer: Self-pay | Admitting: Family Medicine

## 2019-01-31 NOTE — Progress Notes (Signed)
PCP: Cari Caraway, MD  Subjective:   HPI: Patient is a 68 y.o. female here for right shoulder pain.  2/5: Patient reports she's done well until mid-January when pain in lateral right shoulder started again. No acute injury or trauma. Tried to swim a little the other day and felt more weak and painful. Pain level 5-6/10 and sharp with overhead motions, lifting. Feels this also in right trap area. No skin changes, numbness. Has been icing only.  6/30: Patient reports she's overall done well since last visit until about 2 weeks ago. Been more active and swimming. Can be sharp. Noticed pain, swelling lateral aspect of right shoulder. Worse with reaching overhead. Takes tramadol as needed. Doing physical therapy, had dry needling - some bruising with this. Also reports some pain anterior left hip to posterior hip with catching. She had 3 surgeries on this hip including replacement in 04/2011. No new injury. No skin changes.  Past Medical History:  Diagnosis Date  . Rheumatoid arthritis ([place])     Current Outpatient Medications on File Prior to Visit  Medication Sig Dispense Refill  . amphetamine-dextroamphetamine (ADDERALL) 20 MG tablet     . Ascorbic Acid (VITAMIN C) 500 MG CHEW Chew by mouth.    . B Complex Vitamins (VITAMIN-B COMPLEX) TABS Take by mouth.    . calcium carbonate 200 MG capsule Take 250 mg by mouth daily.    . Cholecalciferol (VITAMIN D-1000 MAX ST) 1000 UNITS tablet Take by mouth.    . diazepam (VALIUM) 5 MG tablet TAKE 1 TO 2 TABLETS BY MOUTH ONCE DAILY AS NEEDED    . estradiol (ESTRACE) 1 MG tablet   0  . fexofenadine (ALLEGRA) 180 MG tablet Take by mouth.    . fluticasone (FLONASE) 50 MCG/ACT nasal spray     . folic acid (FOLVITE) 1 MG tablet     . gabapentin (NEURONTIN) 600 MG tablet TAKE 1 2 TO 2 TABLETS BY MOUTH NIGHTLY    . hydroxychloroquine (PLAQUENIL) 200 MG tablet   0  . leflunomide (ARAVA) 20 MG tablet Take 20 mg by mouth daily.    Marland Kitchen  levothyroxine (SYNTHROID, LEVOTHROID) 75 MCG tablet Take 1 tablet (75 mcg total) by mouth daily before breakfast. **PT NEEDS FOLLOW UP APPT** 45 tablet 0  . losartan (COZAAR) 50 MG tablet     . Multiple Vitamin (MULTIVITAMIN) capsule Take by mouth.    Marland Kitchen NIFEdipine (PROCARDIA-XL/ADALAT-CC/NIFEDICAL-XL) 30 MG 24 hr tablet     . traMADol (ULTRAM) 50 MG tablet Take 50 mg by mouth every 6 (six) hours as needed. for pain    . tretinoin (RETIN-A) 0.1 % cream Apply topically.    Marland Kitchen zolpidem (AMBIEN) 10 MG tablet Take by mouth.     No current facility-administered medications on file prior to visit.     Past Surgical History:  Procedure Laterality Date  . BREAST BIOPSY Left    pt not sure of date.  Marland Kitchen HIP SURGERY    . SHOULDER SURGERY      Allergies  Allergen Reactions  . Vancomycin Dermatitis    Red, warm, coughing, anxious reaction    Social History   Socioeconomic History  . Marital status: Unknown    Spouse name: Not on file  . Number of children: 1  . Years of education: Not on file  . Highest education level: Not on file  Occupational History  . Occupation: Radiation protection practitioner  . Financial resource strain: Not on file  .  Food insecurity    Worry: Not on file    Inability: Not on file  . Transportation needs    Medical: Not on file    Non-medical: Not on file  Tobacco Use  . Smoking status: Former Research scientist (life sciences)  . Smokeless tobacco: Never Used  Substance and Sexual Activity  . Alcohol use: Yes    Alcohol/week: 7.0 standard drinks    Types: 7 Standard drinks or equivalent per week  . Drug use: No  . Sexual activity: Not on file  Lifestyle  . Physical activity    Days per week: Not on file    Minutes per session: Not on file  . Stress: Not on file  Relationships  . Social Herbalist on phone: Not on file    Gets together: Not on file    Attends religious service: Not on file    Active member of club or organization: Not on file    Attends meetings  of clubs or organizations: Not on file    Relationship status: Not on file  . Intimate partner violence    Fear of current or ex partner: Not on file    Emotionally abused: Not on file    Physically abused: Not on file    Forced sexual activity: Not on file  Other Topics Concern  . 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 No pertinent family history.  BP (!) 134/94   Ht 5\' 4"  (1.626 m)   Wt 130 lb (59 kg)   BMI 22.31 kg/m   Review of Systems: See HPI above.     Objective:  Physical Exam:  Gen: NAD, comfortable in exam room  Right shoulder: Mod swelling about shoulder.  No bruising, other deformity. No TTP. FROM. Positive Hawkins, Neers. Negative Yergasons. Strength 4/5 with empty can and 3/5 resisted external rotation, 5/5 IR. Negative apprehension. NV intact distally.  Left shoulder: No swelling, ecchymoses.  No gross deformity. No TTP. FROM. Strength 5/5 with empty can and resisted internal/external rotation. NV intact distally.  Left hip: No deformity. FROM with 5/5 strength. Mild tenderness to palpation anterior hip over iliopsoas NVI distally. Negative logroll Negative Fadir.  Limited MSK u/s right shoulder:  Large amount of subacromial bursitis.  Again noted supraspinatus and infraspinatus tears with retraction and high riding glenohumeral head.  Target sign of biceps tendon but tendon intact.  Assessment & Plan:  1. 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  Went ahead with aspiration and injection of subacromial bursa today.   Aleve or ibuprofen if needed.  Tylenol, tramadol if needed.  Continue PT.  F/u in 1 month or prn.  After informed written consent timeout was performed, patient was seated on exam table. Right shoulder was prepped with alcohol swab, 2mL bupivicaine used for local anesthesia.  Then utilizing posterior approach with ultrasound guidance, 67mL clear yellow fluid was aspirated from patient's right subacromial space followed by injection with 3:1 bupivicaine: depomedrol. Patient tolerated the procedure well without immediate complications.  2. Left hip pain - s/p replacement in 2012.  Consistent with hip flexor strain, less external rotator strain.  Advised to contact her surgeon regarding stretching/exercises given with some of the older replacements, motion in a particular direction could put her at risk of dislocation - needs to find out if she has any restrictions from them.  TYlenol, tramadol if needed.

## 2019-02-07 ENCOUNTER — Ambulatory Visit (INDEPENDENT_AMBULATORY_CARE_PROVIDER_SITE_OTHER): Payer: Medicare Other | Admitting: Family Medicine

## 2019-02-07 ENCOUNTER — Other Ambulatory Visit: Payer: Self-pay

## 2019-02-07 VITALS — BP 124/74

## 2019-02-07 DIAGNOSIS — M25511 Pain in right shoulder: Secondary | ICD-10-CM | POA: Diagnosis not present

## 2019-02-07 DIAGNOSIS — M25552 Pain in left hip: Secondary | ICD-10-CM

## 2019-02-07 MED ORDER — NITROGLYCERIN 0.2 MG/HR TD PT24: MEDICATED_PATCH | TRANSDERMAL | 1 refills | Status: DC

## 2019-02-07 NOTE — Patient Instructions (Addendum)
Start the nitro patches for your shoulder - 1/4th patch to affected area, change daily. Activities as tolerated. If you wanted to see a surgeon let me know.  Get x-rays of your hip after you leave - we will contact you with the results and next steps. Consider physical therapy if this looks ok.

## 2019-02-08 ENCOUNTER — Encounter: Payer: Self-pay | Admitting: Family Medicine

## 2019-02-08 ENCOUNTER — Ambulatory Visit
Admission: RE | Admit: 2019-02-08 | Discharge: 2019-02-08 | Disposition: A | Discharge status: Home / Self Care | Payer: Medicare Other | Source: Ambulatory Visit | Attending: Family Medicine | Admitting: Family Medicine

## 2019-02-08 DIAGNOSIS — S79912A Unspecified injury of left hip, initial encounter: Secondary | ICD-10-CM | POA: Diagnosis not present

## 2019-02-08 DIAGNOSIS — M25552 Pain in left hip: Secondary | ICD-10-CM

## 2019-02-08 NOTE — Progress Notes (Signed)
PCP: Cari Caraway, MD  Subjective:   HPI: Patient is a 68 y.o. female here for right shoulder pain, left hip pain.  2/5: Patient reports she's done well until mid-January when pain in lateral right shoulder started again. No acute injury or trauma. Tried to swim a little the other day and felt more weak and painful. Pain level 5-6/10 and sharp with overhead motions, lifting. Feels this also in right trap area. No skin changes, numbness. Has been icing only.  6/30: Patient reports she's overall done well since last visit until about 2 weeks ago. Been more active and swimming. Can be sharp. Noticed pain, swelling lateral aspect of right shoulder. Worse with reaching overhead. Takes tramadol as needed. Doing physical therapy, had dry needling - some bruising with this. Also reports some pain anterior left hip to posterior hip with catching. She had 3 surgeries on this hip including replacement in 04/2011. No new injury. No skin changes.  7/8: Patient's right shoulder pain is mildly improved but swelling has returned following last visit. Curious about doing nitro patches for this. Her left hip pain is worse though anteriorly and posteriorly with catching. Has not had imaging in over a year of this hip No new injuries, skin changes.  Past Medical History:  Diagnosis Date  . Rheumatoid arthritis ([place])     Current Outpatient Medications on File Prior to Visit  Medication Sig Dispense Refill  . amphetamine-dextroamphetamine (ADDERALL) 20 MG tablet     . Ascorbic Acid (VITAMIN C) 500 MG CHEW Chew by mouth.    . B Complex Vitamins (VITAMIN-B COMPLEX) TABS Take by mouth.    . calcium carbonate 200 MG capsule Take 250 mg by mouth daily.    . Cholecalciferol (VITAMIN D-1000 MAX ST) 1000 UNITS tablet Take by mouth.    . diazepam (VALIUM) 5 MG tablet TAKE 1 TO 2 TABLETS BY MOUTH ONCE DAILY AS NEEDED    . estradiol (ESTRACE) 1 MG tablet   0  . fexofenadine (ALLEGRA) 180 MG tablet  Take by mouth.    . fluticasone (FLONASE) 50 MCG/ACT nasal spray     . folic acid (FOLVITE) 1 MG tablet     . gabapentin (NEURONTIN) 600 MG tablet TAKE 1 2 TO 2 TABLETS BY MOUTH NIGHTLY    . hydroxychloroquine (PLAQUENIL) 200 MG tablet   0  . leflunomide (ARAVA) 20 MG tablet Take 20 mg by mouth daily.    Marland Kitchen levothyroxine (SYNTHROID, LEVOTHROID) 75 MCG tablet Take 1 tablet (75 mcg total) by mouth daily before breakfast. **PT NEEDS FOLLOW UP APPT** 45 tablet 0  . losartan (COZAAR) 50 MG tablet     . Multiple Vitamin (MULTIVITAMIN) capsule Take by mouth.    Marland Kitchen NIFEdipine (PROCARDIA-XL/ADALAT-CC/NIFEDICAL-XL) 30 MG 24 hr tablet     . traMADol (ULTRAM) 50 MG tablet Take 50 mg by mouth every 6 (six) hours as needed. for pain    . tretinoin (RETIN-A) 0.1 % cream Apply topically.    Marland Kitchen zolpidem (AMBIEN) 10 MG tablet Take by mouth.     No current facility-administered medications on file prior to visit.     Past Surgical History:  Procedure Laterality Date  . BREAST BIOPSY Left    pt not sure of date.  Marland Kitchen HIP SURGERY    . SHOULDER SURGERY      Allergies  Allergen Reactions  . Vancomycin Dermatitis    Red, warm, coughing, anxious reaction    Social History   Socioeconomic History  .  Marital status: Unknown    Spouse name: Not on file  . Number of children: 1  . Years of education: Not on file  . Highest education level: Not on file  Occupational History  . Occupation: Radiation protection practitioner  . Financial resource strain: Not on file  . Food insecurity    Worry: Not on file    Inability: Not on file  . Transportation needs    Medical: Not on file    Non-medical: Not on file  Tobacco Use  . Smoking status: Former Research scientist (life sciences)  . Smokeless tobacco: Never Used  Substance and Sexual Activity  . Alcohol use: Yes    Alcohol/week: 7.0 standard drinks    Types: 7 Standard drinks or equivalent per week  . Drug use: No  . Sexual activity: Not on file  Lifestyle  . Physical  activity    Days per week: Not on file    Minutes per session: Not on file  . Stress: Not on file  Relationships  . Social Herbalist on phone: Not on file    Gets together: Not on file    Attends religious service: Not on file    Active member of club or organization: Not on file    Attends meetings of clubs or organizations: Not on file    Relationship status: Not on file  . Intimate partner violence    Fear of current or ex partner: Not on file    Emotionally abused: Not on file    Physically abused: Not on file    Forced sexual activity: Not on file  Other Topics Concern  . Not on file  Social History Narrative   Futures trader: Married    2 children; ages 61 and 28   First menstrual cycle at 11 yrs   Partial hysterectomy at 39 yrs   7 pregnancies, 6 miscarriages, 1 birth   Quit smoking in [place] - 2 glasses, daily   Regular execise: walking 2 times a week; Swimming 4 times a week    History reviewed. No pertinent family history.  BP 124/74   Review of Systems: See HPI above.     Objective:  Physical Exam:  Gen: NAD, comfortable in exam room  Right shoulder: Moderate swelling posterior shoulder especially.  No other deformity. FROM. Strength 4/5 empty can, 3/5 ER, 5/5 IR. NVI distally.  Left hip: No deformity. FROM with 5/5 strength but pain on hip flexion especially beyond 90 degrees. NVI distally. Negative logroll.  Mild pain with Fadir.  Assessment & Plan:  1. Right shoulder pain - irreparable full thickness supraspinatus and infraspinatus tears with retraction, significant subacromial bursitis.  Aspiration and injection last visit though has reaccumulated.  She will trial nitro patches.  Aleve or ibuprofen, tylenol, tramadol if needed.  Continue exercises.  F/u in 6 weeks or prn.  2. Left hip pain - s/p replacement in 2012.  Advised repeat hip radiographs to ensure no loosening.  Consider physical therapy if this looks good for hip  flexor, external rotator strains.

## 2019-02-12 DIAGNOSIS — Z96649 Presence of unspecified artificial hip joint: Secondary | ICD-10-CM | POA: Diagnosis not present

## 2019-02-12 DIAGNOSIS — M5416 Radiculopathy, lumbar region: Secondary | ICD-10-CM | POA: Diagnosis not present

## 2019-02-20 ENCOUNTER — Telehealth: Payer: Self-pay | Admitting: Family Medicine

## 2019-02-20 NOTE — Telephone Encounter (Signed)
Patient had an injury/accident and wants to know if she can get a handicap sticker from [place] as she is using a walker right now.  Thanks

## 2019-02-26 ENCOUNTER — Ambulatory Visit: Payer: Medicare Other | Admitting: Family Medicine

## 2019-03-15 DIAGNOSIS — F902 Attention-deficit hyperactivity disorder, combined type: Secondary | ICD-10-CM | POA: Diagnosis not present

## 2019-04-17 DIAGNOSIS — M48061 Spinal stenosis, lumbar region without neurogenic claudication: Secondary | ICD-10-CM | POA: Diagnosis not present

## 2019-04-17 DIAGNOSIS — M5416 Radiculopathy, lumbar region: Secondary | ICD-10-CM | POA: Diagnosis not present

## 2019-04-18 DIAGNOSIS — L4059 Other psoriatic arthropathy: Secondary | ICD-10-CM | POA: Diagnosis not present

## 2019-04-20 DIAGNOSIS — Z23 Encounter for immunization: Secondary | ICD-10-CM | POA: Diagnosis not present

## 2019-04-24 DIAGNOSIS — Z79899 Other long term (current) drug therapy: Secondary | ICD-10-CM | POA: Diagnosis not present

## 2019-04-24 DIAGNOSIS — L4050 Arthropathic psoriasis, unspecified: Secondary | ICD-10-CM | POA: Diagnosis not present

## 2019-04-24 DIAGNOSIS — I7300 Raynaud's syndrome without gangrene: Secondary | ICD-10-CM | POA: Diagnosis not present

## 2019-04-24 DIAGNOSIS — M0609 Rheumatoid arthritis without rheumatoid factor, multiple sites: Secondary | ICD-10-CM | POA: Diagnosis not present

## 2019-04-24 DIAGNOSIS — M150 Primary generalized (osteo)arthritis: Secondary | ICD-10-CM | POA: Diagnosis not present

## 2019-04-24 DIAGNOSIS — Z6822 Body mass index (BMI) 22.0-22.9, adult: Secondary | ICD-10-CM | POA: Diagnosis not present

## 2019-05-08 ENCOUNTER — Emergency Department (HOSPITAL_BASED_OUTPATIENT_CLINIC_OR_DEPARTMENT_OTHER): Payer: Medicare Other

## 2019-05-08 ENCOUNTER — Encounter (HOSPITAL_BASED_OUTPATIENT_CLINIC_OR_DEPARTMENT_OTHER): Payer: Self-pay

## 2019-05-08 ENCOUNTER — Other Ambulatory Visit: Payer: Self-pay

## 2019-05-08 ENCOUNTER — Emergency Department (HOSPITAL_BASED_OUTPATIENT_CLINIC_OR_DEPARTMENT_OTHER)
Admission: EM | Admit: 2019-05-08 | Discharge: 2019-05-08 | Disposition: A | Discharge status: Home / Self Care | Payer: Medicare Other | Attending: Emergency Medicine | Admitting: Emergency Medicine

## 2019-05-08 DIAGNOSIS — Y939 Activity, unspecified: Secondary | ICD-10-CM | POA: Insufficient documentation

## 2019-05-08 DIAGNOSIS — Z79899 Other long term (current) drug therapy: Secondary | ICD-10-CM | POA: Diagnosis not present

## 2019-05-08 DIAGNOSIS — S2232XA Fracture of one rib, left side, initial encounter for closed fracture: Secondary | ICD-10-CM | POA: Diagnosis not present

## 2019-05-08 DIAGNOSIS — R109 Unspecified abdominal pain: Secondary | ICD-10-CM | POA: Diagnosis not present

## 2019-05-08 DIAGNOSIS — E039 Hypothyroidism, unspecified: Secondary | ICD-10-CM | POA: Insufficient documentation

## 2019-05-08 DIAGNOSIS — Y999 Unspecified external cause status: Secondary | ICD-10-CM | POA: Insufficient documentation

## 2019-05-08 DIAGNOSIS — Z87891 Personal history of nicotine dependence: Secondary | ICD-10-CM | POA: Diagnosis not present

## 2019-05-08 DIAGNOSIS — Y929 Unspecified place or not applicable: Secondary | ICD-10-CM | POA: Diagnosis not present

## 2019-05-08 DIAGNOSIS — Z6822 Body mass index (BMI) 22.0-22.9, adult: Secondary | ICD-10-CM | POA: Diagnosis not present

## 2019-05-08 DIAGNOSIS — X58XXXA Exposure to other specified factors, initial encounter: Secondary | ICD-10-CM | POA: Diagnosis not present

## 2019-05-08 DIAGNOSIS — S299XXA Unspecified injury of thorax, initial encounter: Secondary | ICD-10-CM | POA: Diagnosis present

## 2019-05-08 DIAGNOSIS — R0781 Pleurodynia: Secondary | ICD-10-CM | POA: Diagnosis not present

## 2019-05-08 MED ORDER — OXYCODONE-ACETAMINOPHEN 5-325 MG PO TABS: 2.0000 | ORAL_TABLET | Freq: Every day | ORAL | 0 refills | Status: AC

## 2019-05-08 MED ORDER — ONDANSETRON 4 MG PO TBDP: 4.0000 mg | ORAL_TABLET | Freq: Three times a day (TID) | ORAL | 0 refills | Status: DC | PRN

## 2019-05-08 NOTE — Discharge Instructions (Addendum)
Please take medications as prescribed.  The Percocet should help you sleep at night. Do not combine with other narcotics.  Zofran as needed for nausea.  Follow-up with your PCP for further evaluation and management of your fracture.  Consider pathologic fracture or due to low bone density.  Return to ED should you develop any fever or respiratory difficulty.   You were prescribed a narcotic for your pain. Do not drive. Do not use machinery or power tools. Do not sign legal documents. Do not drink alcohol. Do not take sleeping pills. Do not supervise children by yourself. Do not participate in activities that require climbing or being in high places.

## 2019-05-08 NOTE — ED Provider Notes (Signed)
[facility] EMERGENCY DEPARTMENT Provider Note   CSN: [id number] Arrival date & time: 05/08/19  1303     History   Chief Complaint Chief Complaint  Patient presents with  . Back Pain    HPI Virginia Gordon is a 68 y.o. female with past medical history significant for degenerative arthritis of the hip, degenerative disc disease, and spondylolisthesis who presents to the ER for a 3-day history of worsening, sharp left-sided abdominal pain.  Pain is worse with lying down in the inspiration.  She states that she swims 4 times a week and that she denies any new or increased physical activity.  She thought that perhaps it was musculoskeletal, but she has not been able to sleep at night and reports that it is getting worse which prompted her to see her PCP at [facility].  Her doctor was unavailable, but she saw Christa See, NP who apparently told her to come to [place] for a CT.  She denies any recent illness, fevers, chills, nausea, vomiting, change in bowel habits, urinary symptoms, recent leg swelling, history of clots, chest pain, shortness of breath, or any other symptoms.  Last BM was this morning. She was scheduled to get a lumbar injection at [facility] today, but was instructed to come here instead.      HPI  Past Medical History:  Diagnosis Date  . Rheumatoid arthritis [facility])     Patient Active Problem List   Diagnosis Date Noted  . ADD (attention deficit disorder) 11/13/2015  . Connective tissue disease overlap syndrome ([place]) 11/13/2015  . Menopausal and perimenopausal disorder 11/13/2015  . Acne erythematosa 11/13/2015  . Disordered sleep 11/13/2015  . History of operative procedure on hip 07/03/2015  . Left knee pain 05/20/2014  . Right foot pain 05/14/2014  . Bilateral shoulder pain 05/14/2014  . Hypothyroidism 03/08/2014  . Degenerative arthritis of hip 03/07/2012    Past Surgical History:  Procedure Laterality Date  . BREAST BIOPSY Left     pt not sure of date.  Marland Kitchen HIP SURGERY    . SHOULDER SURGERY       OB History    Gravida  1   Para      Term      Preterm      AB      Living        SAB      TAB      Ectopic      Multiple      Live Births               Home Medications    Prior to Admission medications   Medication Sig Start Date End Date Taking? Authorizing Provider  amphetamine-dextroamphetamine (ADDERALL) 20 MG tablet  10/27/15   [provider]  Ascorbic Acid (VITAMIN C) 500 MG CHEW Chew by mouth.    [provider]  B Complex Vitamins (VITAMIN-B COMPLEX) TABS Take by mouth.    [provider]  calcium carbonate 200 MG capsule Take 250 mg by mouth daily.    [provider]  Cholecalciferol (VITAMIN D-1000 MAX ST) 1000 UNITS tablet Take by mouth.    [provider]  diazepam (VALIUM) 5 MG tablet TAKE 1 TO 2 TABLETS BY MOUTH ONCE DAILY AS NEEDED 08/11/18   [provider]  estradiol (ESTRACE) 1 MG tablet  08/13/14   [provider]  fexofenadine (ALLEGRA) 180 MG tablet Take by mouth.    [provider]  fluticasone (FLONASE) 50 MCG/ACT nasal spray  10/15/13   [provider]  folic acid (FOLVITE) 1 MG tablet  10/12/13   [provider]  gabapentin (NEURONTIN) 600 MG tablet TAKE 1 2 TO 2 TABLETS BY MOUTH NIGHTLY 08/31/18   [provider]  hydroxychloroquine (PLAQUENIL) 200 MG tablet  08/03/14   [provider]  leflunomide (ARAVA) 20 MG tablet Take 20 mg by mouth daily. 08/20/18   [provider]  levothyroxine (SYNTHROID, LEVOTHROID) 75 MCG tablet Take 1 tablet (75 mcg total) by mouth daily before breakfast. **PT NEEDS FOLLOW UP APPT** 07/24/15   Philemon Kingdom, MD  losartan (COZAAR) 50 MG tablet  09/02/15   [provider]  Multiple Vitamin (MULTIVITAMIN) capsule Take by mouth.    [provider]  NIFEdipine (PROCARDIA-XL/ADALAT-CC/NIFEDICAL-XL) 30 MG 24 hr tablet   11/04/15   [provider]  nitroGLYCERIN (NITRODUR - DOSED IN MG/24 HR) 0.2 mg/hr patch Apply 1/4th patch to affected shoulder, change daily 02/07/19   Hudnall, Sharyn Lull, MD  ondansetron (ZOFRAN ODT) 4 MG disintegrating tablet Take 1 tablet (4 mg total) by mouth every 8 (eight) hours as needed for nausea or vomiting. 05/08/19   Corena Herter, PA-C  oxyCODONE-acetaminophen (PERCOCET/ROXICET) 5-325 MG tablet Take 2 tablets by mouth at bedtime for 5 days. 05/08/19 05/13/19  Corena Herter, PA-C  traMADol (ULTRAM) 50 MG tablet Take 50 mg by mouth every 6 (six) hours as needed. for pain 01/05/19   [provider]  tretinoin (RETIN-A) 0.1 % cream Apply topically. 02/09/12   [provider]  zolpidem (AMBIEN) 10 MG tablet Take by mouth. 10/10/13   [provider]    Family History History reviewed. No pertinent family history.  Social History Social History   Tobacco Use  . Smoking status: Former Research scientist (life sciences)  . Smokeless tobacco: Never Used  Substance Use Topics  . Alcohol use: Yes    Alcohol/week: 7.0 standard drinks    Types: 7 Standard drinks or equivalent per week  . Drug use: No     Allergies   Vancomycin   Review of Systems Review of Systems  All other systems reviewed and are negative.    Physical Exam Updated Vital Signs BP (!) 148/107 (BP Location: Left Arm)   Pulse 97   Temp 97.9 F (36.6 C) (Oral)   Resp 16   Ht 5\' 4"  (1.626 m)   Wt 59.9 kg   SpO2 99%   BMI 22.66 kg/m   Physical Exam Vitals signs and nursing note reviewed. Exam conducted with a chaperone present.  Constitutional:      Appearance: Normal appearance.  HENT:     Head: Normocephalic and atraumatic.  Eyes:     General: No scleral icterus.    Conjunctiva/sclera: Conjunctivae normal.  Pulmonary:     Effort: Pulmonary effort is normal.  Musculoskeletal:     Comments: Significant tenderness to palpation of lateral aspect of left mid abdomen, immediately inferior to  ribs.  Skin:    General: Skin is dry.     Comments: No overlying erythema or discoloration.  Neurological:     Mental Status: She is alert.     GCS: GCS eye subscore is 4. GCS verbal subscore is 5. GCS motor subscore is 6.  Psychiatric:        Mood and Affect: Mood normal.        Behavior: Behavior normal.        Thought Content: Thought  content normal.      ED Treatments / Results  Labs (all labs ordered are listed, but only abnormal results are displayed) Labs Reviewed - No data to display  EKG None  Radiology Dg Ribs Unilateral W/chest Left  Result Date: 05/08/2019 CLINICAL DATA:  68 year old with three-day history of LEFT LOWER rib pain. No known injury. EXAM: LEFT RIBS AND CHEST - 3+ VIEW COMPARISON:  None. FINDINGS: The site of maximum pain and tenderness was marked with a small metallic BB. Acute mildly displaced fracture involving the anterolateral ninth rib. No other fractures. Cardiac silhouette normal in size. Thoracic aorta minimally atherosclerotic. Hilar and mediastinal contours otherwise unremarkable. Lungs clear. Bronchovascular markings normal. Pulmonary vascularity normal. No visible pleural effusions. No pneumothorax. IMPRESSION: 1. Acute mildly displaced fracture involving the anterolateral left ninth rib. 2. No acute cardiopulmonary disease. Electronically Signed   By: Evangeline Dakin M.D.   On: 05/08/2019 16:10    Procedures Procedures (including critical care time)  Medications Ordered in ED Medications - No data to display   Initial Impression / Assessment and Plan / ED Course  I have reviewed the triage vital signs and the nursing notes.  Pertinent labs & imaging results that were available during my care of the patient were reviewed by me and considered in my medical decision making (see chart for details).        Patient has localized left chest wall and rib discomfort on lateral aspect left upper quadrant.  Reproducible with palpation.  She  denies any trauma, but denies any all other symptoms.  Given her unusual presentation and no history of trauma, consulted with Dr. Johnney Killian and she also evaluated the patient.   Reviewed plain films and patient has an acute and mildly displaced fracture involving the anterolateral ninth rib.  No evidence of any other acute cardiopulmonary findings.  Will prescribe her percocet to take at night to help her sleep.  Added on Zofran as she occasionally gets nauseated from opioids.  Also provide her an incentive spirometer with instructions to use intermittently to help expand lungs.  She did tell me that she has had previous fractures with no significant stress or trauma.  Strongly encouraged her to follow-up with her PCP for DEXA scan or other bone density evaluation plus or minus bisphosphonates.  Also want to rule out pathologic fracture.  Patient voiced understanding is agreeable plan.    Final Clinical Impressions(s) / ED Diagnoses   Final diagnoses:  Closed fracture of one rib of left side, initial encounter    ED Discharge Orders         Ordered    oxyCODONE-acetaminophen (PERCOCET/ROXICET) 5-325 MG tablet  Daily at bedtime     05/08/19 1637    ondansetron (ZOFRAN ODT) 4 MG disintegrating tablet  Every 8 hours PRN     05/08/19 1637    Incentive spirometry RT     05/08/19 1637           Corena Herter, PA-C 05/08/19 1647    Charlesetta Shanks, MD 05/14/19 [phone]

## 2019-05-08 NOTE — ED Notes (Signed)
ED Provider at bedside. 

## 2019-05-08 NOTE — ED Notes (Signed)
Patient transported to X-ray 

## 2019-05-08 NOTE — ED Triage Notes (Signed)
Hx of chronic back pain, pt thinks she pulled muscle on Saturday left lower back/side. Iced with no relief. Abd tenderness.

## 2019-05-21 DIAGNOSIS — R1013 Epigastric pain: Secondary | ICD-10-CM | POA: Diagnosis not present

## 2019-05-21 DIAGNOSIS — M2550 Pain in unspecified joint: Secondary | ICD-10-CM | POA: Diagnosis not present

## 2019-05-21 DIAGNOSIS — M85852 Other specified disorders of bone density and structure, left thigh: Secondary | ICD-10-CM | POA: Diagnosis not present

## 2019-05-21 DIAGNOSIS — Z131 Encounter for screening for diabetes mellitus: Secondary | ICD-10-CM | POA: Diagnosis not present

## 2019-05-21 DIAGNOSIS — G8929 Other chronic pain: Secondary | ICD-10-CM | POA: Diagnosis not present

## 2019-05-21 DIAGNOSIS — M069 Rheumatoid arthritis, unspecified: Secondary | ICD-10-CM | POA: Diagnosis not present

## 2019-05-21 DIAGNOSIS — M4807 Spinal stenosis, lumbosacral region: Secondary | ICD-10-CM | POA: Diagnosis not present

## 2019-05-21 DIAGNOSIS — I10 Essential (primary) hypertension: Secondary | ICD-10-CM | POA: Diagnosis not present

## 2019-05-21 DIAGNOSIS — R58 Hemorrhage, not elsewhere classified: Secondary | ICD-10-CM | POA: Diagnosis not present

## 2019-05-21 DIAGNOSIS — Z79899 Other long term (current) drug therapy: Secondary | ICD-10-CM | POA: Diagnosis not present

## 2019-05-21 DIAGNOSIS — M14671 Charcot's joint, right ankle and foot: Secondary | ICD-10-CM | POA: Diagnosis not present

## 2019-05-21 DIAGNOSIS — E039 Hypothyroidism, unspecified: Secondary | ICD-10-CM | POA: Diagnosis not present

## 2019-05-23 DIAGNOSIS — M47816 Spondylosis without myelopathy or radiculopathy, lumbar region: Secondary | ICD-10-CM | POA: Diagnosis not present

## 2019-05-23 DIAGNOSIS — M4726 Other spondylosis with radiculopathy, lumbar region: Secondary | ICD-10-CM | POA: Diagnosis not present

## 2019-05-23 DIAGNOSIS — M545 Low back pain: Secondary | ICD-10-CM | POA: Diagnosis not present

## 2019-06-11 ENCOUNTER — Other Ambulatory Visit: Payer: Self-pay | Admitting: Family Medicine

## 2019-06-11 DIAGNOSIS — E2839 Other primary ovarian failure: Secondary | ICD-10-CM

## 2019-06-13 ENCOUNTER — Ambulatory Visit
Admission: RE | Admit: 2019-06-13 | Discharge: 2019-06-13 | Disposition: A | Discharge status: Home / Self Care | Payer: Medicare Other | Source: Ambulatory Visit | Attending: Family Medicine | Admitting: Family Medicine

## 2019-06-13 ENCOUNTER — Other Ambulatory Visit: Payer: Self-pay

## 2019-06-13 DIAGNOSIS — Z13820 Encounter for screening for osteoporosis: Secondary | ICD-10-CM | POA: Diagnosis not present

## 2019-06-13 DIAGNOSIS — Z780 Asymptomatic menopausal state: Secondary | ICD-10-CM | POA: Diagnosis not present

## 2019-06-13 DIAGNOSIS — F902 Attention-deficit hyperactivity disorder, combined type: Secondary | ICD-10-CM | POA: Diagnosis not present

## 2019-06-13 DIAGNOSIS — E2839 Other primary ovarian failure: Secondary | ICD-10-CM

## 2019-07-03 DIAGNOSIS — L4050 Arthropathic psoriasis, unspecified: Secondary | ICD-10-CM | POA: Diagnosis not present

## 2019-07-03 DIAGNOSIS — M0609 Rheumatoid arthritis without rheumatoid factor, multiple sites: Secondary | ICD-10-CM | POA: Diagnosis not present

## 2019-07-03 DIAGNOSIS — I7300 Raynaud's syndrome without gangrene: Secondary | ICD-10-CM | POA: Diagnosis not present

## 2019-07-03 DIAGNOSIS — Z79899 Other long term (current) drug therapy: Secondary | ICD-10-CM | POA: Diagnosis not present

## 2019-07-03 DIAGNOSIS — Z6821 Body mass index (BMI) 21.0-21.9, adult: Secondary | ICD-10-CM | POA: Diagnosis not present

## 2019-07-03 DIAGNOSIS — M150 Primary generalized (osteo)arthritis: Secondary | ICD-10-CM | POA: Diagnosis not present

## 2019-07-03 DIAGNOSIS — Z92241 Personal history of systemic steroid therapy: Secondary | ICD-10-CM | POA: Diagnosis not present

## 2019-07-04 ENCOUNTER — Other Ambulatory Visit: Payer: Self-pay

## 2019-07-04 DIAGNOSIS — Z20822 Contact with and (suspected) exposure to covid-19: Secondary | ICD-10-CM

## 2019-07-04 DIAGNOSIS — Z20828 Contact with and (suspected) exposure to other viral communicable diseases: Secondary | ICD-10-CM | POA: Diagnosis not present

## 2019-07-07 LAB — NOVEL CORONAVIRUS, NAA: SARS-CoV-2, NAA: NOT DETECTED

## 2019-07-11 DIAGNOSIS — L4059 Other psoriatic arthropathy: Secondary | ICD-10-CM | POA: Diagnosis not present

## 2019-07-16 ENCOUNTER — Ambulatory Visit: Payer: Medicare Other | Admitting: Family Medicine

## 2019-08-07 DIAGNOSIS — Z79890 Hormone replacement therapy (postmenopausal): Secondary | ICD-10-CM | POA: Diagnosis not present

## 2019-08-13 DIAGNOSIS — H2513 Age-related nuclear cataract, bilateral: Secondary | ICD-10-CM | POA: Diagnosis not present

## 2019-08-13 DIAGNOSIS — E871 Hypo-osmolality and hyponatremia: Secondary | ICD-10-CM | POA: Diagnosis not present

## 2019-08-13 DIAGNOSIS — Z0000 Encounter for general adult medical examination without abnormal findings: Secondary | ICD-10-CM | POA: Diagnosis not present

## 2019-08-13 DIAGNOSIS — M14671 Charcot's joint, right ankle and foot: Secondary | ICD-10-CM | POA: Diagnosis not present

## 2019-08-13 DIAGNOSIS — M2550 Pain in unspecified joint: Secondary | ICD-10-CM | POA: Diagnosis not present

## 2019-08-13 DIAGNOSIS — I10 Essential (primary) hypertension: Secondary | ICD-10-CM | POA: Diagnosis not present

## 2019-08-13 DIAGNOSIS — H04123 Dry eye syndrome of bilateral lacrimal glands: Secondary | ICD-10-CM | POA: Diagnosis not present

## 2019-08-13 DIAGNOSIS — H5203 Hypermetropia, bilateral: Secondary | ICD-10-CM | POA: Diagnosis not present

## 2019-08-13 DIAGNOSIS — Z79899 Other long term (current) drug therapy: Secondary | ICD-10-CM | POA: Diagnosis not present

## 2019-08-13 DIAGNOSIS — M545 Low back pain: Secondary | ICD-10-CM | POA: Diagnosis not present

## 2019-08-13 DIAGNOSIS — E039 Hypothyroidism, unspecified: Secondary | ICD-10-CM | POA: Diagnosis not present

## 2019-08-13 DIAGNOSIS — H524 Presbyopia: Secondary | ICD-10-CM | POA: Diagnosis not present

## 2019-08-13 DIAGNOSIS — E559 Vitamin D deficiency, unspecified: Secondary | ICD-10-CM | POA: Diagnosis not present

## 2019-08-13 DIAGNOSIS — F900 Attention-deficit hyperactivity disorder, predominantly inattentive type: Secondary | ICD-10-CM | POA: Diagnosis not present

## 2019-08-13 DIAGNOSIS — L4050 Arthropathic psoriasis, unspecified: Secondary | ICD-10-CM | POA: Diagnosis not present

## 2019-08-13 DIAGNOSIS — M85852 Other specified disorders of bone density and structure, left thigh: Secondary | ICD-10-CM | POA: Diagnosis not present

## 2019-08-29 DIAGNOSIS — M4726 Other spondylosis with radiculopathy, lumbar region: Secondary | ICD-10-CM | POA: Diagnosis not present

## 2019-08-29 DIAGNOSIS — M4316 Spondylolisthesis, lumbar region: Secondary | ICD-10-CM | POA: Diagnosis not present

## 2019-08-29 DIAGNOSIS — M47816 Spondylosis without myelopathy or radiculopathy, lumbar region: Secondary | ICD-10-CM | POA: Diagnosis not present

## 2019-08-31 DIAGNOSIS — I10 Essential (primary) hypertension: Secondary | ICD-10-CM | POA: Diagnosis not present

## 2019-08-31 DIAGNOSIS — M069 Rheumatoid arthritis, unspecified: Secondary | ICD-10-CM | POA: Diagnosis not present

## 2019-08-31 DIAGNOSIS — E039 Hypothyroidism, unspecified: Secondary | ICD-10-CM | POA: Diagnosis not present

## 2019-09-04 DIAGNOSIS — H25812 Combined forms of age-related cataract, left eye: Secondary | ICD-10-CM | POA: Diagnosis not present

## 2019-09-04 DIAGNOSIS — H25012 Cortical age-related cataract, left eye: Secondary | ICD-10-CM | POA: Diagnosis not present

## 2019-09-04 DIAGNOSIS — H2512 Age-related nuclear cataract, left eye: Secondary | ICD-10-CM | POA: Diagnosis not present

## 2019-09-06 DIAGNOSIS — L4059 Other psoriatic arthropathy: Secondary | ICD-10-CM | POA: Diagnosis not present

## 2019-09-13 DIAGNOSIS — F902 Attention-deficit hyperactivity disorder, combined type: Secondary | ICD-10-CM | POA: Diagnosis not present

## 2019-09-19 DIAGNOSIS — E039 Hypothyroidism, unspecified: Secondary | ICD-10-CM | POA: Diagnosis not present

## 2019-09-19 DIAGNOSIS — I10 Essential (primary) hypertension: Secondary | ICD-10-CM | POA: Diagnosis not present

## 2019-09-19 DIAGNOSIS — M069 Rheumatoid arthritis, unspecified: Secondary | ICD-10-CM | POA: Diagnosis not present

## 2019-09-21 DIAGNOSIS — L4059 Other psoriatic arthropathy: Secondary | ICD-10-CM | POA: Diagnosis not present

## 2019-10-05 DIAGNOSIS — L4059 Other psoriatic arthropathy: Secondary | ICD-10-CM | POA: Diagnosis not present

## 2019-10-05 DIAGNOSIS — D649 Anemia, unspecified: Secondary | ICD-10-CM | POA: Diagnosis not present

## 2019-10-12 DIAGNOSIS — E8809 Other disorders of plasma-protein metabolism, not elsewhere classified: Secondary | ICD-10-CM | POA: Diagnosis not present

## 2019-10-12 DIAGNOSIS — D509 Iron deficiency anemia, unspecified: Secondary | ICD-10-CM | POA: Diagnosis not present

## 2019-10-12 DIAGNOSIS — R350 Frequency of micturition: Secondary | ICD-10-CM | POA: Diagnosis not present

## 2019-10-15 DIAGNOSIS — M150 Primary generalized (osteo)arthritis: Secondary | ICD-10-CM | POA: Diagnosis not present

## 2019-10-15 DIAGNOSIS — I7300 Raynaud's syndrome without gangrene: Secondary | ICD-10-CM | POA: Diagnosis not present

## 2019-10-15 DIAGNOSIS — L4059 Other psoriatic arthropathy: Secondary | ICD-10-CM | POA: Diagnosis not present

## 2019-10-15 DIAGNOSIS — M0609 Rheumatoid arthritis without rheumatoid factor, multiple sites: Secondary | ICD-10-CM | POA: Diagnosis not present

## 2019-10-15 DIAGNOSIS — Z6821 Body mass index (BMI) 21.0-21.9, adult: Secondary | ICD-10-CM | POA: Diagnosis not present

## 2019-10-15 DIAGNOSIS — E611 Iron deficiency: Secondary | ICD-10-CM | POA: Diagnosis not present

## 2019-10-16 DIAGNOSIS — D509 Iron deficiency anemia, unspecified: Secondary | ICD-10-CM | POA: Diagnosis not present

## 2019-10-30 DIAGNOSIS — E039 Hypothyroidism, unspecified: Secondary | ICD-10-CM | POA: Diagnosis not present

## 2019-10-30 DIAGNOSIS — D509 Iron deficiency anemia, unspecified: Secondary | ICD-10-CM | POA: Diagnosis not present

## 2019-10-30 DIAGNOSIS — M069 Rheumatoid arthritis, unspecified: Secondary | ICD-10-CM | POA: Diagnosis not present

## 2019-10-30 DIAGNOSIS — I10 Essential (primary) hypertension: Secondary | ICD-10-CM | POA: Diagnosis not present

## 2019-10-31 DIAGNOSIS — D509 Iron deficiency anemia, unspecified: Secondary | ICD-10-CM | POA: Diagnosis not present

## 2019-11-06 DIAGNOSIS — D509 Iron deficiency anemia, unspecified: Secondary | ICD-10-CM | POA: Diagnosis not present

## 2019-11-06 DIAGNOSIS — M0589 Other rheumatoid arthritis with rheumatoid factor of multiple sites: Secondary | ICD-10-CM | POA: Diagnosis not present

## 2019-11-06 DIAGNOSIS — D649 Anemia, unspecified: Secondary | ICD-10-CM | POA: Diagnosis not present

## 2019-11-06 DIAGNOSIS — L4059 Other psoriatic arthropathy: Secondary | ICD-10-CM | POA: Diagnosis not present

## 2019-11-08 DIAGNOSIS — R1013 Epigastric pain: Secondary | ICD-10-CM | POA: Diagnosis not present

## 2019-11-08 DIAGNOSIS — F329 Major depressive disorder, single episode, unspecified: Secondary | ICD-10-CM | POA: Diagnosis not present

## 2019-11-08 DIAGNOSIS — I10 Essential (primary) hypertension: Secondary | ICD-10-CM | POA: Diagnosis not present

## 2019-11-08 DIAGNOSIS — M069 Rheumatoid arthritis, unspecified: Secondary | ICD-10-CM | POA: Diagnosis not present

## 2019-11-08 DIAGNOSIS — M545 Low back pain: Secondary | ICD-10-CM | POA: Diagnosis not present

## 2019-11-08 DIAGNOSIS — E039 Hypothyroidism, unspecified: Secondary | ICD-10-CM | POA: Diagnosis not present

## 2019-11-08 DIAGNOSIS — G8929 Other chronic pain: Secondary | ICD-10-CM | POA: Diagnosis not present

## 2019-11-08 DIAGNOSIS — M5441 Lumbago with sciatica, right side: Secondary | ICD-10-CM | POA: Diagnosis not present

## 2019-11-08 DIAGNOSIS — D649 Anemia, unspecified: Secondary | ICD-10-CM | POA: Diagnosis not present

## 2019-11-08 DIAGNOSIS — M85852 Other specified disorders of bone density and structure, left thigh: Secondary | ICD-10-CM | POA: Diagnosis not present

## 2019-11-08 DIAGNOSIS — F900 Attention-deficit hyperactivity disorder, predominantly inattentive type: Secondary | ICD-10-CM | POA: Diagnosis not present

## 2019-11-13 DIAGNOSIS — Z96643 Presence of artificial hip joint, bilateral: Secondary | ICD-10-CM | POA: Diagnosis not present

## 2019-11-13 DIAGNOSIS — T84021D Dislocation of internal left hip prosthesis, subsequent encounter: Secondary | ICD-10-CM | POA: Diagnosis not present

## 2019-11-13 DIAGNOSIS — Z96649 Presence of unspecified artificial hip joint: Secondary | ICD-10-CM | POA: Diagnosis not present

## 2019-11-13 DIAGNOSIS — M47816 Spondylosis without myelopathy or radiculopathy, lumbar region: Secondary | ICD-10-CM | POA: Diagnosis not present

## 2019-11-13 DIAGNOSIS — S32472A Displaced fracture of medial wall of left acetabulum, initial encounter for closed fracture: Secondary | ICD-10-CM | POA: Diagnosis not present

## 2019-11-13 DIAGNOSIS — X500XXA Overexertion from strenuous movement or load, initial encounter: Secondary | ICD-10-CM | POA: Diagnosis not present

## 2019-11-13 DIAGNOSIS — S32472D Displaced fracture of medial wall of left acetabulum, subsequent encounter for fracture with routine healing: Secondary | ICD-10-CM | POA: Diagnosis not present

## 2019-11-13 DIAGNOSIS — M1612 Unilateral primary osteoarthritis, left hip: Secondary | ICD-10-CM | POA: Diagnosis not present

## 2019-11-13 DIAGNOSIS — M47817 Spondylosis without myelopathy or radiculopathy, lumbosacral region: Secondary | ICD-10-CM | POA: Diagnosis not present

## 2019-11-13 DIAGNOSIS — Z87891 Personal history of nicotine dependence: Secondary | ICD-10-CM | POA: Diagnosis not present

## 2019-11-13 DIAGNOSIS — Z96642 Presence of left artificial hip joint: Secondary | ICD-10-CM | POA: Diagnosis not present

## 2019-11-14 DIAGNOSIS — R937 Abnormal findings on diagnostic imaging of other parts of musculoskeletal system: Secondary | ICD-10-CM | POA: Diagnosis not present

## 2019-11-14 DIAGNOSIS — M85852 Other specified disorders of bone density and structure, left thigh: Secondary | ICD-10-CM | POA: Diagnosis not present

## 2019-11-14 DIAGNOSIS — S72009A Fracture of unspecified part of neck of unspecified femur, initial encounter for closed fracture: Secondary | ICD-10-CM | POA: Diagnosis not present

## 2019-11-14 DIAGNOSIS — S32409A Unspecified fracture of unspecified acetabulum, initial encounter for closed fracture: Secondary | ICD-10-CM | POA: Diagnosis not present

## 2019-11-14 DIAGNOSIS — M25452 Effusion, left hip: Secondary | ICD-10-CM | POA: Diagnosis not present

## 2019-11-14 DIAGNOSIS — Z96642 Presence of left artificial hip joint: Secondary | ICD-10-CM | POA: Diagnosis not present

## 2019-11-14 DIAGNOSIS — M24852 Other specific joint derangements of left hip, not elsewhere classified: Secondary | ICD-10-CM | POA: Diagnosis not present

## 2019-11-19 ENCOUNTER — Emergency Department (HOSPITAL_COMMUNITY): Payer: Medicare Other

## 2019-11-19 ENCOUNTER — Encounter (HOSPITAL_COMMUNITY): Payer: Self-pay | Admitting: Emergency Medicine

## 2019-11-19 ENCOUNTER — Emergency Department (HOSPITAL_COMMUNITY)
Admission: EM | Admit: 2019-11-19 | Discharge: 2019-11-19 | Disposition: A | Discharge status: Home / Self Care | Payer: Medicare Other | Attending: Emergency Medicine | Admitting: Emergency Medicine

## 2019-11-19 DIAGNOSIS — E039 Hypothyroidism, unspecified: Secondary | ICD-10-CM | POA: Insufficient documentation

## 2019-11-19 DIAGNOSIS — S73005A Unspecified dislocation of left hip, initial encounter: Secondary | ICD-10-CM

## 2019-11-19 DIAGNOSIS — Y92018 Other place in single-family (private) house as the place of occurrence of the external cause: Secondary | ICD-10-CM | POA: Diagnosis not present

## 2019-11-19 DIAGNOSIS — Y998 Other external cause status: Secondary | ICD-10-CM | POA: Diagnosis not present

## 2019-11-19 DIAGNOSIS — Z87891 Personal history of nicotine dependence: Secondary | ICD-10-CM | POA: Insufficient documentation

## 2019-11-19 DIAGNOSIS — R52 Pain, unspecified: Secondary | ICD-10-CM | POA: Diagnosis not present

## 2019-11-19 DIAGNOSIS — I10 Essential (primary) hypertension: Secondary | ICD-10-CM | POA: Diagnosis not present

## 2019-11-19 DIAGNOSIS — F909 Attention-deficit hyperactivity disorder, unspecified type: Secondary | ICD-10-CM | POA: Insufficient documentation

## 2019-11-19 DIAGNOSIS — Z79899 Other long term (current) drug therapy: Secondary | ICD-10-CM | POA: Insufficient documentation

## 2019-11-19 DIAGNOSIS — Y9301 Activity, walking, marching and hiking: Secondary | ICD-10-CM | POA: Insufficient documentation

## 2019-11-19 DIAGNOSIS — T84021A Dislocation of internal left hip prosthesis, initial encounter: Secondary | ICD-10-CM | POA: Diagnosis not present

## 2019-11-19 DIAGNOSIS — S79912A Unspecified injury of left hip, initial encounter: Secondary | ICD-10-CM | POA: Diagnosis present

## 2019-11-19 DIAGNOSIS — S73005D Unspecified dislocation of left hip, subsequent encounter: Secondary | ICD-10-CM | POA: Diagnosis not present

## 2019-11-19 DIAGNOSIS — X509XXA Other and unspecified overexertion or strenuous movements or postures, initial encounter: Secondary | ICD-10-CM | POA: Diagnosis not present

## 2019-11-19 MED ORDER — PROPOFOL 10 MG/ML IV BOLUS
INTRAVENOUS | Status: AC | PRN
  Administered 2019-11-19 (×3): 20 mg via INTRAVENOUS
  Administered 2019-11-19: 40 mg via INTRAVENOUS
  Administered 2019-11-19: 20 mg via INTRAVENOUS

## 2019-11-19 MED ORDER — PROPOFOL 10 MG/ML IV BOLUS
1.0000 mg/kg | Freq: Once | INTRAVENOUS | Status: DC
  Filled 2019-11-19: qty 20

## 2019-11-19 NOTE — ED Provider Notes (Signed)
Medical screening examination/treatment/procedure(s) were conducted as a shared visit with non-physician practitioner(s) and myself.  I personally evaluated the patient during the encounter.    .Sedation  Date/Time: 11/19/2019 2:06 PM Performed by: Charlesetta Shanks, MD Authorized by: Charlesetta Shanks, MD   Consent:    Consent obtained:  Verbal   Consent given by:  Patient   Risks discussed:  Allergic reaction, dysrhythmia, inadequate sedation, nausea, prolonged hypoxia resulting in organ damage, prolonged sedation necessitating reversal, respiratory compromise necessitating ventilatory assistance and intubation and vomiting   Alternatives discussed:  Analgesia without sedation, anxiolysis and regional anesthesia Universal protocol:    Procedure explained and questions answered to patient or proxy's satisfaction: yes     Relevant documents present and verified: yes     Test results available and properly labeled: yes     Imaging studies available: yes     Required blood products, implants, devices, and special equipment available: yes     Site/side marked: yes     Immediately prior to procedure a time out was called: yes     Patient identity confirmation method:  Verbally with patient Pre-sedation assessment:    Time since last food or drink:  20   ASA classification: class 2 - patient with mild systemic disease     Neck mobility: normal     Mouth opening:  3 or more finger widths   Thyromental distance:  4 finger widths   Mallampati score:  I - soft palate, uvula, fauces, pillars visible   Pre-sedation assessments completed and reviewed: airway patency, cardiovascular function, hydration status, mental status, nausea/vomiting, pain level, respiratory function and temperature     Pre-sedation assessment completed:  11/19/2019 1:00 PM Immediate pre-procedure details:    Reassessment: Patient reassessed immediately prior to procedure     Reviewed: vital signs, relevant labs/tests and NPO  status     Verified: bag valve mask available, emergency equipment available, intubation equipment available, IV patency confirmed, oxygen available and suction available   Procedure details (see MAR for exact dosages):    Preoxygenation:  Nasal cannula   Sedation:  Propofol   Intended level of sedation: deep   Intra-procedure monitoring:  Blood pressure monitoring, cardiac monitor, continuous pulse oximetry, frequent LOC assessments, frequent vital sign checks and continuous capnometry   Intra-procedure events: none     Total Provider sedation time (minutes):  20 Post-procedure details:    Attendance: Constant attendance by certified staff until patient recovered     Recovery: Patient returned to pre-procedure baseline     Post-sedation assessments completed and reviewed: airway patency, cardiovascular function, hydration status, mental status, nausea/vomiting, pain level, respiratory function and temperature     Patient is stable for discharge or admission: yes     Patient tolerance:  Tolerated well, no immediate complications  Patient had a spontaneous hip dislocation with twisting motion.  Reduction documentation by PA-C Moss Mc, MD 11/19/19 1409

## 2019-11-19 NOTE — Discharge Instructions (Signed)
You were seen in the ER today for a hip dislocation. This was reduced in the ER.   Please do not bear weight your only place about 25% of your weight on your left lower extremity.  Utilize your walker at home.  Do not bend over.  Avoid quick movements.  Try to keep your knee straight at all times.  Please follow-up with your orthopedic doctor within the next 2 days.  Return to the ER for new or worsening symptoms including but not limited to increased pain, recurrent dislocation, fever, numbness, weakness, or any other concerns.

## 2019-11-19 NOTE — ED Notes (Signed)
Patient verbalizes understanding of discharge instructions. Opportunity for questioning and answers were provided. Armband removed by staff, pt discharged from ED.  

## 2019-11-19 NOTE — ED Triage Notes (Signed)
Pt here from home with c/o left hip dislocation / chronic , pt has had several surgeries on both hips , pt was just twisting at the house no trauma

## 2019-11-19 NOTE — ED Provider Notes (Signed)
[facility] EMERGENCY DEPARTMENT Provider Note   CSN: [id number] Arrival date & time: 11/19/19  1021     History Chief Complaint  Patient presents with  . Hip Pain    Virginia Gordon is a 69 y.o. female with a history of prior left hip total arthroplasty with recurrent hip dislocations, rheumatoid arthritis, and hypothyroidism who presents to the emergency department with complaints of left hip pain which began shortly prior to arrival.  Patient states that she was twisting to go back into the house when she felt an abnormal sensation in the left hip with acute onset of pain.  She was able to bear weight and hobble back into her home.  States she is having pain mostly to the hip a little bit into the upper leg.  It is worse with movement and attempts to bear weight.  Alleviated some when she sits still.  This feels similar to prior dislocations.  Orthopedic surgeon is at [facility].  She states frequently she receives sedation and this is able to be reduced, has not required OR reduction in the past.  Denies numbness, weakness, wounds, fever, chills, or other areas of injury.   HPI     Past Medical History:  Diagnosis Date  . Rheumatoid arthritis [facility])     Patient Active Problem List   Diagnosis Date Noted  . ADD (attention deficit disorder) 11/13/2015  . Connective tissue disease overlap syndrome ([place]) 11/13/2015  . Menopausal and perimenopausal disorder 11/13/2015  . Acne erythematosa 11/13/2015  . Disordered sleep 11/13/2015  . History of operative procedure on hip 07/03/2015  . Left knee pain 05/20/2014  . Right foot pain 05/14/2014  . Bilateral shoulder pain 05/14/2014  . Hypothyroidism 03/08/2014  . Degenerative arthritis of hip 03/07/2012    Past Surgical History:  Procedure Laterality Date  . BREAST BIOPSY Left    pt not sure of date.  Marland Kitchen HIP SURGERY    . SHOULDER SURGERY       OB History    Gravida  1   Para      Term      Preterm      AB      Living        SAB      TAB      Ectopic      Multiple      Live Births              History reviewed. No pertinent family history.  Social History   Tobacco Use  . Smoking status: Former Research scientist (life sciences)  . Smokeless tobacco: Never Used  Substance Use Topics  . Alcohol use: Yes    Alcohol/week: 7.0 standard drinks    Types: 7 Standard drinks or equivalent per week  . Drug use: No    Home Medications Prior to Admission medications   Medication Sig Start Date End Date Taking? Authorizing Provider  amphetamine-dextroamphetamine (ADDERALL) 20 MG tablet  10/27/15   [provider]  Ascorbic Acid (VITAMIN C) 500 MG CHEW Chew by mouth.    [provider]  B Complex Vitamins (VITAMIN-B COMPLEX) TABS Take by mouth.    [provider]  calcium carbonate 200 MG capsule Take 250 mg by mouth daily.    [provider]  Cholecalciferol (VITAMIN D-1000 MAX ST) 1000 UNITS tablet Take by mouth.    [provider]  diazepam (VALIUM) 5 MG tablet TAKE 1 TO 2 TABLETS BY MOUTH ONCE DAILY AS NEEDED  08/11/18   [provider]  estradiol (ESTRACE) 1 MG tablet  08/13/14   [provider]  fexofenadine (ALLEGRA) 180 MG tablet Take by mouth.    [provider]  fluticasone Asencion Islam) 50 MCG/ACT nasal spray  10/15/13   [provider]  folic acid (FOLVITE) 1 MG tablet  10/12/13   [provider]  gabapentin (NEURONTIN) 600 MG tablet TAKE 1 2 TO 2 TABLETS BY MOUTH NIGHTLY 08/31/18   [provider]  hydroxychloroquine (PLAQUENIL) 200 MG tablet  08/03/14   [provider]  leflunomide (ARAVA) 20 MG tablet Take 20 mg by mouth daily. 08/20/18   [provider]  levothyroxine (SYNTHROID, LEVOTHROID) 75 MCG tablet Take 1 tablet (75 mcg total) by mouth daily before breakfast. **PT NEEDS FOLLOW UP APPT** 07/24/15   Philemon Kingdom, MD  losartan (COZAAR) 50 MG tablet  09/02/15   [provider]    Multiple Vitamin (MULTIVITAMIN) capsule Take by mouth.    [provider]  NIFEdipine (PROCARDIA-XL/ADALAT-CC/NIFEDICAL-XL) 30 MG 24 hr tablet  11/04/15   [provider]  nitroGLYCERIN (NITRODUR - DOSED IN MG/24 HR) 0.2 mg/hr patch Apply 1/4th patch to affected shoulder, change daily 02/07/19   Hudnall, Sharyn Lull, MD  ondansetron (ZOFRAN ODT) 4 MG disintegrating tablet Take 1 tablet (4 mg total) by mouth every 8 (eight) hours as needed for nausea or vomiting. 05/08/19   Corena Herter, PA-C  traMADol (ULTRAM) 50 MG tablet Take 50 mg by mouth every 6 (six) hours as needed. for pain 01/05/19   [provider]  tretinoin (RETIN-A) 0.1 % cream Apply topically. 02/09/12   [provider]  zolpidem (AMBIEN) 10 MG tablet Take by mouth. 10/10/13   [provider]    Allergies    Vancomycin  Review of Systems   Review of Systems  Constitutional: Negative for chills and fever.  Respiratory: Negative for shortness of breath.   Cardiovascular: Negative for chest pain.  Gastrointestinal: Negative for abdominal pain and vomiting.  Musculoskeletal: Positive for arthralgias.  Skin: Negative for color change, rash and wound.  Neurological: Negative for weakness and numbness.  All other systems reviewed and are negative.   Physical Exam Updated Vital Signs BP (!) 148/91 (BP Location: Right Arm)   Pulse 79   Temp 98.9 F (37.2 C) (Oral)   Resp 17   SpO2 98%   Physical Exam Vitals and nursing note reviewed.  Constitutional:      General: She is not in acute distress.    Appearance: She is well-developed. She is not ill-appearing or toxic-appearing.  HENT:     Head: Normocephalic and atraumatic.  Eyes:     General:        Right eye: No discharge.        Left eye: No discharge.     Conjunctiva/sclera: Conjunctivae normal.  Cardiovascular:     Rate and Rhythm: Normal rate and regular rhythm.     Pulses:          Dorsalis pedis pulses are 2+ on the right  side and 2+ on the left side.       Posterior tibial pulses are 2+ on the right side and 2+ on the left side.  Pulmonary:     Effort: Pulmonary effort is normal. No respiratory distress.     Breath sounds: Normal breath sounds. No wheezing, rhonchi or rales.  Abdominal:     General: There is no distension.     Palpations:  Abdomen is soft.     Tenderness: There is no abdominal tenderness.  Musculoskeletal:     Cervical back: Neck supple.     Comments: Lower extremities: LLE is shortened and mildly externally rotated. No overlying erythema/warmth to the joint. Patient has intact AROM throughout the RLE and to the L ankle, L hip/knee limited secondary to hip pain. Tender to palpation to the L hip diffusely as well as to the proximal 2/3rds of the femur.   Skin:    General: Skin is warm and dry.     Capillary Refill: Capillary refill takes less than 2 seconds.     Findings: No rash.  Neurological:     Mental Status: She is alert.     Comments: Alert. Clear speech. Sensation grossly intact to bilateral lower extremities. 5/5 strength with plantar/dorsiflexion bilaterally.  Psychiatric:        Mood and Affect: Mood normal.        Behavior: Behavior normal.     ED Results / Procedures / Treatments   Labs (all labs ordered are listed, but only abnormal results are displayed) Labs Reviewed - No data to display  EKG None  Radiology DG Pelvis 1-2 Views  Result Date: 11/19/2019 CLINICAL DATA:  Left hip pain. Clinical concern for dislocation of total hip arthroplasty. Patient reports recently being informed of a fracture at an outside institution. EXAM: PELVIS - 1-2 VIEW; LEFT FEMUR 2 VIEWS COMPARISON:  Left hip radiographs 02/08/2019. FINDINGS: Status post bilateral total hip arthroplasty. No changes are seen on the right. On the left, there is progressive acetabular protrusio with a resulting fracture of the medial wall of the left acetabulum. The fracture margins are well corticated, and  this fracture may not be acute although it is new from 02/08/2019. There is dislocation of the left total hip arthroplasty. No hardware loosening or femur fracture. Stable chronic heterotopic ossification lateral to the left hip. There is advanced lower lumbar spondylosis. IMPRESSION: 1. Dislocation of the left total hip arthroplasty. 2. New fracture of the medial wall of the left acetabulum since July 2020, possibly subacute in age. 3. No definite hardware loosening. Electronically Signed   By: Richardean Sale M.D.   On: 11/19/2019 11:21   DG Femur Min 2 Views Left  Result Date: 11/19/2019 CLINICAL DATA:  Left hip pain. Clinical concern for dislocation of total hip arthroplasty. Patient reports recently being informed of a fracture at an outside institution. EXAM: PELVIS - 1-2 VIEW; LEFT FEMUR 2 VIEWS COMPARISON:  Left hip radiographs 02/08/2019. FINDINGS: Status post bilateral total hip arthroplasty. No changes are seen on the right. On the left, there is progressive acetabular protrusio with a resulting fracture of the medial wall of the left acetabulum. The fracture margins are well corticated, and this fracture may not be acute although it is new from 02/08/2019. There is dislocation of the left total hip arthroplasty. No hardware loosening or femur fracture. Stable chronic heterotopic ossification lateral to the left hip. There is advanced lower lumbar spondylosis. IMPRESSION: 1. Dislocation of the left total hip arthroplasty. 2. New fracture of the medial wall of the left acetabulum since July 2020, possibly subacute in age. 3. No definite hardware loosening. Electronically Signed   By: Richardean Sale M.D.   On: 11/19/2019 11:21    Procedures Reduction of dislocation  Date/Time: 11/19/2019 2:00 PM Performed by: Amaryllis Dyke, PA-C Authorized by: Amaryllis Dyke, PA-C  Consent: Verbal consent obtained. Written consent obtained. Risks and benefits: risks,  benefits and alternatives  were discussed Consent given by: patient Patient understanding: patient states understanding of the procedure being performed Patient consent: the patient's understanding of the procedure matches consent given Procedure consent: procedure consent matches procedure scheduled Relevant documents: relevant documents present and verified Test results: test results available and properly labeled Site marked: the operative site was marked Imaging studies: imaging studies available Required items: required blood products, implants, devices, and special equipment available Patient identity confirmed: verbally with patient Time out: Immediately prior to procedure a "time out" was called to verify the correct patient, procedure, equipment, support staff and site/side marked as required. Local anesthesia used: no  Anesthesia: Local anesthesia used: no  Sedation: Patient sedated: yes Sedatives: propofol Vitals: Vital signs were monitored during sedation.  Patient tolerance: patient tolerated the procedure well with no immediate complications Comments: Reduction performed with supervising physician Dr. Johnney Killian- please see her note for sedation procedure.     (including critical care time)  Medications Ordered in ED Medications  propofol (DIPRIVAN) 10 mg/mL bolus/IV push 56 mg (has no administration in time range)  propofol (DIPRIVAN) 10 mg/mL bolus/IV push (20 mg Intravenous Given 11/19/19 1347)    ED Course  I have reviewed the triage vital signs and the nursing notes.  Pertinent labs & imaging results that were available during my care of the patient were reviewed by me and considered in my medical decision making (see chart for details).   CT hip left without contrast (11/14/2019 3:39 PM EDT) CT hip left without contrast (11/14/2019 3:39 PM EDT)  Specimen     CT hip left without contrast (11/14/2019 3:39 PM EDT)  Narrative Performed At  Examination: CT left hip without contrast.     INDICATION: Medial wall acetabular fracture. Closed, displaced fracture of  acetabulum. History of left hip arthroplasty.    COMPARISON: Lumbar spine x-ray from January 2020. Pelvis x-ray from  yesterday.    TECHNIQUE: Serial axial images of the left hip without intravenous  contrast. Sagittal and coronal reformatted images are obtained. Dose  reduction was obtained with Automatic Exposure Control (AEC) or, if AEC  could not be utilized, by manual adjustment of the mA and/or kV according  to patient size.    FINDINGS:    There is artifact from the left hip prosthesis. Correlating with findings  from the comparison radiograph, there is extensive fragmentation, lucency  and cortical break involving the left acetabulum extending towards the  superior pubic ramus, as well as with potential erosive changes extending  down along the posterior aspect of the femoral shaft and periprosthetic  femur, for example images 37 through 48 of series 5. In addition to the  osseous fragmentation, there is a large fluid collection interdigitating  along the posterior aspect of the left hip measuring 10.1 x 6.1 x 12.4 cm  and extending into the hip joint. There is soft tissue extending along the  left pelvic sidewall protruding through the expected location of the  acetabulum.    IMPRESSION:    There is extensive fragmentation and bony destruction of the left  acetabulum in this patient with status post hip arthroplasty. In addition,  there is a large, complex multiloculated fluid collection along the  posterior margin of the hip extending to the hip joint. There are  periprosthetic lucencies of the proximal left femur with cortical erosion.  The constellation of findings is suspicious for chronic hardware  loosening/failure with reactive fluid collection. An infectious process is  also possible and  sampling of the fluid collection is recommended.      Electronically Signed by: Elmore Guise, MD, [facility]  Electronically Signed on: 11/14/2019 3:54 PM       MDM Rules/Calculators/A&P                     Patient presents to the emergency department with complaints of left hip pain is suspect dislocation.  She is nontoxic, resting comfortably, vitals WNL.  She is declining analgesics on initial assessment.  She is in obvious deformity to her left lower extremity, tender over the left hip the proximal two thirds of the femur, neurovascularly intact distally.  Differential diagnosis includes hip fracture, hip dislocation, muscle strain.  While she is pending arthrocentesis to rule out infectious process her external hip does not appear erythematous, warm, or have any open wounds, she is afebrile.  Additional history obtained:  Additional history obtained from previous records- obtained and reviewed.  CT scan report as above.  Pending arthrocentesis of the L hip scheduled 11/21/19  Imaging Studies ordered:  I ordered imaging studies which included pelvis & L femur xrays, I independently visualized and interpreted imaging which showed 1. Dislocation of the left total hip arthroplasty. 2. New fracture of the medial wall of the left acetabulum since July 2020, possibly subacute in age. 3. No definite hardware loosening. ---> in terms of left acetabulum fracture- seen on recent CT by primary orthopedics team.   Consultations Obtained: 11:45: Consult placed to patient's orthopedist at [facility] 12:15: Re-paged.  13:05: CONSULT: Discussed with Iona Hansen PA-C with patient's orthopedics team- okay to reduce in the ED, acetabulum fracture thought to be due to insufficiency- seen on prior images, return to prior precautions- Touch down, 25% weightbear, hip precautions- no bending. Appreciate consultation.   Medicines ordered:  I ordered medication propofol for procedural sedation.    Reevaluation: After the interventions stated above, I reevaluated  the patient and found her to feel much better, states her hip feels back to baseline. Remains NVI distally. Repeat xray with successful reduction. Patient alert & oriented tolerating PO s/p sedation. Will discharge home, minimal weightbearing- 25%, hip precautions discussed as well. Discussed option of knee immobilizer, patient feels these are typically not very helpful for her, discussed with Dr. Johnney Killian- ok with no immobilizer. Follow up with her orthopedist. I discussed results, treatment plan, need for follow-up, and return precautions with the patient & her husband at bedside. Provided opportunity for questions, patient & her husband confirmed understanding and are in agreement with plan.   This is a shared visit with supervising physician Dr. Johnney Killian who has independently evaluated patient & provided guidance in evaluation/management/disposition, in agreement with care   Portions of this note were generated with Dragon dictation software. Dictation errors may occur despite best attempts at proofreading.   Final Clinical Impression(s) / ED Diagnoses Final diagnoses:  Closed dislocation of left hip, initial encounter [facility])    Rx / DC Orders ED Discharge Orders    None       Amaryllis Dyke, PA-C 11/19/19 [place], MD 11/26/19 2302

## 2019-11-19 NOTE — ED Provider Notes (Signed)
Medical screening examination/treatment/procedure(s) were conducted as a shared visit with non-physician practitioner(s) and myself.  I personally evaluated the patient during the encounter.    Patient has had several recurrent surgical repairs of the left hip.  She has had recurrent dislocations over the last 1 has not been recent.  She is working with her orthopod at [facility] because there is apparently a fracture somewhere in the prosthesis.  Patient reports the hip dislocated with just a minor movement.  It was no fall or trauma.  Patient denies it is significantly painful.  She reports it is uncomfortable but not severe.  She denies unusual numbness or tingling to her foot.  Patient is alert and well in appearance.  No respiratory distress.  Airway widely patent.  Dentition normal and intact.  Motion of jaw normal.  Heart regular.  Lungs are clear.  Patient has external rotation of the left lower extremity.  Peripheral edema.  Bilateral pulses are 2+ and strong.  Both feet are warm and dry.  Patient would like [place] to consult with her orthopod at [facility] before proceeding with reduction.  She reports they expressed concern for possible damage to the prosthesis during the process of reduction.  Will try to contact her provider before proceeding.  Patient is neurovascularly intact and denies severe pain at this time.   Charlesetta Shanks, MD 11/19/19 1124

## 2019-11-21 DIAGNOSIS — Z96642 Presence of left artificial hip joint: Secondary | ICD-10-CM | POA: Diagnosis not present

## 2019-11-27 DIAGNOSIS — Z20822 Contact with and (suspected) exposure to covid-19: Secondary | ICD-10-CM | POA: Diagnosis not present

## 2019-11-27 DIAGNOSIS — Z01818 Encounter for other preprocedural examination: Secondary | ICD-10-CM | POA: Diagnosis not present

## 2019-11-29 DIAGNOSIS — Z20822 Contact with and (suspected) exposure to covid-19: Secondary | ICD-10-CM | POA: Diagnosis present

## 2019-11-29 DIAGNOSIS — M069 Rheumatoid arthritis, unspecified: Secondary | ICD-10-CM | POA: Diagnosis present

## 2019-11-29 DIAGNOSIS — M978XXA Periprosthetic fracture around other internal prosthetic joint, initial encounter: Secondary | ICD-10-CM | POA: Diagnosis present

## 2019-11-29 DIAGNOSIS — T84091A Other mechanical complication of internal left hip prosthesis, initial encounter: Secondary | ICD-10-CM | POA: Diagnosis not present

## 2019-11-29 DIAGNOSIS — M65862 Other synovitis and tenosynovitis, left lower leg: Secondary | ICD-10-CM | POA: Diagnosis not present

## 2019-11-29 DIAGNOSIS — M351 Other overlap syndromes: Secondary | ICD-10-CM | POA: Diagnosis present

## 2019-11-29 DIAGNOSIS — I7300 Raynaud's syndrome without gangrene: Secondary | ICD-10-CM | POA: Diagnosis present

## 2019-11-29 DIAGNOSIS — F909 Attention-deficit hyperactivity disorder, unspecified type: Secondary | ICD-10-CM | POA: Diagnosis present

## 2019-11-29 DIAGNOSIS — Z96642 Presence of left artificial hip joint: Secondary | ICD-10-CM | POA: Diagnosis not present

## 2019-11-29 DIAGNOSIS — Z96641 Presence of right artificial hip joint: Secondary | ICD-10-CM | POA: Diagnosis present

## 2019-11-29 DIAGNOSIS — K219 Gastro-esophageal reflux disease without esophagitis: Secondary | ICD-10-CM | POA: Diagnosis present

## 2019-11-29 DIAGNOSIS — Z87891 Personal history of nicotine dependence: Secondary | ICD-10-CM | POA: Diagnosis not present

## 2019-11-29 DIAGNOSIS — T8484XA Pain due to internal orthopedic prosthetic devices, implants and grafts, initial encounter: Secondary | ICD-10-CM | POA: Diagnosis present

## 2019-11-29 DIAGNOSIS — T84021A Dislocation of internal left hip prosthesis, initial encounter: Secondary | ICD-10-CM | POA: Diagnosis not present

## 2019-11-29 DIAGNOSIS — S32472A Displaced fracture of medial wall of left acetabulum, initial encounter for closed fracture: Secondary | ICD-10-CM | POA: Diagnosis not present

## 2019-11-29 DIAGNOSIS — Z79899 Other long term (current) drug therapy: Secondary | ICD-10-CM | POA: Diagnosis not present

## 2019-11-29 DIAGNOSIS — T8452XA Infection and inflammatory reaction due to internal left hip prosthesis, initial encounter: Secondary | ICD-10-CM | POA: Diagnosis present

## 2019-11-29 DIAGNOSIS — E039 Hypothyroidism, unspecified: Secondary | ICD-10-CM | POA: Diagnosis present

## 2019-11-29 DIAGNOSIS — L719 Rosacea, unspecified: Secondary | ICD-10-CM | POA: Diagnosis present

## 2019-11-29 DIAGNOSIS — L4050 Arthropathic psoriasis, unspecified: Secondary | ICD-10-CM | POA: Diagnosis present

## 2019-11-29 DIAGNOSIS — G4700 Insomnia, unspecified: Secondary | ICD-10-CM | POA: Diagnosis present

## 2019-11-29 DIAGNOSIS — G8918 Other acute postprocedural pain: Secondary | ICD-10-CM | POA: Diagnosis not present

## 2019-11-29 DIAGNOSIS — M24452 Recurrent dislocation, left hip: Secondary | ICD-10-CM | POA: Diagnosis not present

## 2019-11-29 DIAGNOSIS — B954 Other streptococcus as the cause of diseases classified elsewhere: Secondary | ICD-10-CM | POA: Diagnosis present

## 2019-11-29 DIAGNOSIS — Z881 Allergy status to other antibiotic agents status: Secondary | ICD-10-CM | POA: Diagnosis not present

## 2019-12-06 DIAGNOSIS — M41126 Adolescent idiopathic scoliosis, lumbar region: Secondary | ICD-10-CM | POA: Diagnosis not present

## 2019-12-06 DIAGNOSIS — Z792 Long term (current) use of antibiotics: Secondary | ICD-10-CM | POA: Diagnosis not present

## 2019-12-06 DIAGNOSIS — A5216 Charcot's arthropathy (tabetic): Secondary | ICD-10-CM | POA: Diagnosis not present

## 2019-12-06 DIAGNOSIS — T8489XD Other specified complication of internal orthopedic prosthetic devices, implants and grafts, subsequent encounter: Secondary | ICD-10-CM | POA: Diagnosis not present

## 2019-12-06 DIAGNOSIS — Z452 Encounter for adjustment and management of vascular access device: Secondary | ICD-10-CM | POA: Diagnosis not present

## 2019-12-06 DIAGNOSIS — Z87891 Personal history of nicotine dependence: Secondary | ICD-10-CM | POA: Diagnosis not present

## 2019-12-06 DIAGNOSIS — T8452XA Infection and inflammatory reaction due to internal left hip prosthesis, initial encounter: Secondary | ICD-10-CM | POA: Diagnosis not present

## 2019-12-06 DIAGNOSIS — G479 Sleep disorder, unspecified: Secondary | ICD-10-CM | POA: Diagnosis not present

## 2019-12-06 DIAGNOSIS — Z5181 Encounter for therapeutic drug level monitoring: Secondary | ICD-10-CM | POA: Diagnosis not present

## 2019-12-06 DIAGNOSIS — M5116 Intervertebral disc disorders with radiculopathy, lumbar region: Secondary | ICD-10-CM | POA: Diagnosis not present

## 2019-12-06 DIAGNOSIS — F909 Attention-deficit hyperactivity disorder, unspecified type: Secondary | ICD-10-CM | POA: Diagnosis not present

## 2019-12-06 DIAGNOSIS — M069 Rheumatoid arthritis, unspecified: Secondary | ICD-10-CM | POA: Diagnosis not present

## 2019-12-06 DIAGNOSIS — T84021D Dislocation of internal left hip prosthesis, subsequent encounter: Secondary | ICD-10-CM | POA: Diagnosis not present

## 2019-12-06 DIAGNOSIS — K219 Gastro-esophageal reflux disease without esophagitis: Secondary | ICD-10-CM | POA: Diagnosis not present

## 2019-12-06 DIAGNOSIS — M75101 Unspecified rotator cuff tear or rupture of right shoulder, not specified as traumatic: Secondary | ICD-10-CM | POA: Diagnosis not present

## 2019-12-06 DIAGNOSIS — I7300 Raynaud's syndrome without gangrene: Secondary | ICD-10-CM | POA: Diagnosis not present

## 2019-12-06 DIAGNOSIS — E039 Hypothyroidism, unspecified: Secondary | ICD-10-CM | POA: Diagnosis not present

## 2019-12-06 DIAGNOSIS — M48062 Spinal stenosis, lumbar region with neurogenic claudication: Secondary | ICD-10-CM | POA: Diagnosis not present

## 2019-12-06 DIAGNOSIS — B954 Other streptococcus as the cause of diseases classified elsewhere: Secondary | ICD-10-CM | POA: Diagnosis not present

## 2019-12-06 DIAGNOSIS — M351 Other overlap syndromes: Secondary | ICD-10-CM | POA: Diagnosis not present

## 2019-12-06 DIAGNOSIS — G8929 Other chronic pain: Secondary | ICD-10-CM | POA: Diagnosis not present

## 2019-12-10 DIAGNOSIS — I7300 Raynaud's syndrome without gangrene: Secondary | ICD-10-CM | POA: Diagnosis not present

## 2019-12-10 DIAGNOSIS — M069 Rheumatoid arthritis, unspecified: Secondary | ICD-10-CM | POA: Diagnosis not present

## 2019-12-10 DIAGNOSIS — T84021D Dislocation of internal left hip prosthesis, subsequent encounter: Secondary | ICD-10-CM | POA: Diagnosis not present

## 2019-12-10 DIAGNOSIS — T8452XA Infection and inflammatory reaction due to internal left hip prosthesis, initial encounter: Secondary | ICD-10-CM | POA: Diagnosis not present

## 2019-12-10 DIAGNOSIS — T8489XD Other specified complication of internal orthopedic prosthetic devices, implants and grafts, subsequent encounter: Secondary | ICD-10-CM | POA: Diagnosis not present

## 2019-12-10 DIAGNOSIS — B954 Other streptococcus as the cause of diseases classified elsewhere: Secondary | ICD-10-CM | POA: Diagnosis not present

## 2019-12-11 DIAGNOSIS — T8489XD Other specified complication of internal orthopedic prosthetic devices, implants and grafts, subsequent encounter: Secondary | ICD-10-CM | POA: Diagnosis not present

## 2019-12-11 DIAGNOSIS — B954 Other streptococcus as the cause of diseases classified elsewhere: Secondary | ICD-10-CM | POA: Diagnosis not present

## 2019-12-11 DIAGNOSIS — T84021D Dislocation of internal left hip prosthesis, subsequent encounter: Secondary | ICD-10-CM | POA: Diagnosis not present

## 2019-12-11 DIAGNOSIS — I7300 Raynaud's syndrome without gangrene: Secondary | ICD-10-CM | POA: Diagnosis not present

## 2019-12-11 DIAGNOSIS — T8452XA Infection and inflammatory reaction due to internal left hip prosthesis, initial encounter: Secondary | ICD-10-CM | POA: Diagnosis not present

## 2019-12-11 DIAGNOSIS — M069 Rheumatoid arthritis, unspecified: Secondary | ICD-10-CM | POA: Diagnosis not present

## 2019-12-13 DIAGNOSIS — B954 Other streptococcus as the cause of diseases classified elsewhere: Secondary | ICD-10-CM | POA: Diagnosis not present

## 2019-12-13 DIAGNOSIS — M069 Rheumatoid arthritis, unspecified: Secondary | ICD-10-CM | POA: Diagnosis not present

## 2019-12-13 DIAGNOSIS — T8489XD Other specified complication of internal orthopedic prosthetic devices, implants and grafts, subsequent encounter: Secondary | ICD-10-CM | POA: Diagnosis not present

## 2019-12-13 DIAGNOSIS — T8452XA Infection and inflammatory reaction due to internal left hip prosthesis, initial encounter: Secondary | ICD-10-CM | POA: Diagnosis not present

## 2019-12-13 DIAGNOSIS — I7300 Raynaud's syndrome without gangrene: Secondary | ICD-10-CM | POA: Diagnosis not present

## 2019-12-13 DIAGNOSIS — T84021D Dislocation of internal left hip prosthesis, subsequent encounter: Secondary | ICD-10-CM | POA: Diagnosis not present

## 2019-12-20 DIAGNOSIS — M069 Rheumatoid arthritis, unspecified: Secondary | ICD-10-CM | POA: Diagnosis not present

## 2019-12-20 DIAGNOSIS — I7300 Raynaud's syndrome without gangrene: Secondary | ICD-10-CM | POA: Diagnosis not present

## 2019-12-20 DIAGNOSIS — T8489XD Other specified complication of internal orthopedic prosthetic devices, implants and grafts, subsequent encounter: Secondary | ICD-10-CM | POA: Diagnosis not present

## 2019-12-20 DIAGNOSIS — T84021D Dislocation of internal left hip prosthesis, subsequent encounter: Secondary | ICD-10-CM | POA: Diagnosis not present

## 2019-12-20 DIAGNOSIS — B954 Other streptococcus as the cause of diseases classified elsewhere: Secondary | ICD-10-CM | POA: Diagnosis not present

## 2019-12-20 DIAGNOSIS — T8452XA Infection and inflammatory reaction due to internal left hip prosthesis, initial encounter: Secondary | ICD-10-CM | POA: Diagnosis not present

## 2019-12-27 DIAGNOSIS — T8489XD Other specified complication of internal orthopedic prosthetic devices, implants and grafts, subsequent encounter: Secondary | ICD-10-CM | POA: Diagnosis not present

## 2019-12-27 DIAGNOSIS — I7300 Raynaud's syndrome without gangrene: Secondary | ICD-10-CM | POA: Diagnosis not present

## 2019-12-27 DIAGNOSIS — T84021D Dislocation of internal left hip prosthesis, subsequent encounter: Secondary | ICD-10-CM | POA: Diagnosis not present

## 2019-12-27 DIAGNOSIS — B954 Other streptococcus as the cause of diseases classified elsewhere: Secondary | ICD-10-CM | POA: Diagnosis not present

## 2019-12-27 DIAGNOSIS — T8452XA Infection and inflammatory reaction due to internal left hip prosthesis, initial encounter: Secondary | ICD-10-CM | POA: Diagnosis not present

## 2019-12-27 DIAGNOSIS — M069 Rheumatoid arthritis, unspecified: Secondary | ICD-10-CM | POA: Diagnosis not present

## 2019-12-31 DIAGNOSIS — F329 Major depressive disorder, single episode, unspecified: Secondary | ICD-10-CM | POA: Diagnosis not present

## 2019-12-31 DIAGNOSIS — D509 Iron deficiency anemia, unspecified: Secondary | ICD-10-CM | POA: Diagnosis not present

## 2019-12-31 DIAGNOSIS — I10 Essential (primary) hypertension: Secondary | ICD-10-CM | POA: Diagnosis not present

## 2019-12-31 DIAGNOSIS — M069 Rheumatoid arthritis, unspecified: Secondary | ICD-10-CM | POA: Diagnosis not present

## 2019-12-31 DIAGNOSIS — E039 Hypothyroidism, unspecified: Secondary | ICD-10-CM | POA: Diagnosis not present

## 2020-01-02 DIAGNOSIS — Z9889 Other specified postprocedural states: Secondary | ICD-10-CM | POA: Diagnosis not present

## 2020-01-02 DIAGNOSIS — Z8619 Personal history of other infectious and parasitic diseases: Secondary | ICD-10-CM | POA: Diagnosis not present

## 2020-01-03 DIAGNOSIS — B954 Other streptococcus as the cause of diseases classified elsewhere: Secondary | ICD-10-CM | POA: Diagnosis not present

## 2020-01-03 DIAGNOSIS — I7300 Raynaud's syndrome without gangrene: Secondary | ICD-10-CM | POA: Diagnosis not present

## 2020-01-03 DIAGNOSIS — M069 Rheumatoid arthritis, unspecified: Secondary | ICD-10-CM | POA: Diagnosis not present

## 2020-01-03 DIAGNOSIS — T8489XD Other specified complication of internal orthopedic prosthetic devices, implants and grafts, subsequent encounter: Secondary | ICD-10-CM | POA: Diagnosis not present

## 2020-01-03 DIAGNOSIS — T8452XA Infection and inflammatory reaction due to internal left hip prosthesis, initial encounter: Secondary | ICD-10-CM | POA: Diagnosis not present

## 2020-01-03 DIAGNOSIS — T84021D Dislocation of internal left hip prosthesis, subsequent encounter: Secondary | ICD-10-CM | POA: Diagnosis not present

## 2020-01-05 DIAGNOSIS — T8489XD Other specified complication of internal orthopedic prosthetic devices, implants and grafts, subsequent encounter: Secondary | ICD-10-CM | POA: Diagnosis not present

## 2020-01-05 DIAGNOSIS — Z792 Long term (current) use of antibiotics: Secondary | ICD-10-CM | POA: Diagnosis not present

## 2020-01-05 DIAGNOSIS — Z5181 Encounter for therapeutic drug level monitoring: Secondary | ICD-10-CM | POA: Diagnosis not present

## 2020-01-05 DIAGNOSIS — M48062 Spinal stenosis, lumbar region with neurogenic claudication: Secondary | ICD-10-CM | POA: Diagnosis not present

## 2020-01-05 DIAGNOSIS — M5116 Intervertebral disc disorders with radiculopathy, lumbar region: Secondary | ICD-10-CM | POA: Diagnosis not present

## 2020-01-05 DIAGNOSIS — T84021D Dislocation of internal left hip prosthesis, subsequent encounter: Secondary | ICD-10-CM | POA: Diagnosis not present

## 2020-01-05 DIAGNOSIS — E039 Hypothyroidism, unspecified: Secondary | ICD-10-CM | POA: Diagnosis not present

## 2020-01-05 DIAGNOSIS — A5216 Charcot's arthropathy (tabetic): Secondary | ICD-10-CM | POA: Diagnosis not present

## 2020-01-05 DIAGNOSIS — M75101 Unspecified rotator cuff tear or rupture of right shoulder, not specified as traumatic: Secondary | ICD-10-CM | POA: Diagnosis not present

## 2020-01-05 DIAGNOSIS — M351 Other overlap syndromes: Secondary | ICD-10-CM | POA: Diagnosis not present

## 2020-01-05 DIAGNOSIS — M41126 Adolescent idiopathic scoliosis, lumbar region: Secondary | ICD-10-CM | POA: Diagnosis not present

## 2020-01-05 DIAGNOSIS — Z87891 Personal history of nicotine dependence: Secondary | ICD-10-CM | POA: Diagnosis not present

## 2020-01-05 DIAGNOSIS — Z452 Encounter for adjustment and management of vascular access device: Secondary | ICD-10-CM | POA: Diagnosis not present

## 2020-01-05 DIAGNOSIS — G479 Sleep disorder, unspecified: Secondary | ICD-10-CM | POA: Diagnosis not present

## 2020-01-05 DIAGNOSIS — I7300 Raynaud's syndrome without gangrene: Secondary | ICD-10-CM | POA: Diagnosis not present

## 2020-01-05 DIAGNOSIS — M069 Rheumatoid arthritis, unspecified: Secondary | ICD-10-CM | POA: Diagnosis not present

## 2020-01-05 DIAGNOSIS — B954 Other streptococcus as the cause of diseases classified elsewhere: Secondary | ICD-10-CM | POA: Diagnosis not present

## 2020-01-05 DIAGNOSIS — T8452XA Infection and inflammatory reaction due to internal left hip prosthesis, initial encounter: Secondary | ICD-10-CM | POA: Diagnosis not present

## 2020-01-05 DIAGNOSIS — G8929 Other chronic pain: Secondary | ICD-10-CM | POA: Diagnosis not present

## 2020-01-05 DIAGNOSIS — F909 Attention-deficit hyperactivity disorder, unspecified type: Secondary | ICD-10-CM | POA: Diagnosis not present

## 2020-01-05 DIAGNOSIS — K219 Gastro-esophageal reflux disease without esophagitis: Secondary | ICD-10-CM | POA: Diagnosis not present

## 2020-01-10 DIAGNOSIS — M069 Rheumatoid arthritis, unspecified: Secondary | ICD-10-CM | POA: Diagnosis not present

## 2020-01-10 DIAGNOSIS — T8452XA Infection and inflammatory reaction due to internal left hip prosthesis, initial encounter: Secondary | ICD-10-CM | POA: Diagnosis not present

## 2020-01-10 DIAGNOSIS — I7300 Raynaud's syndrome without gangrene: Secondary | ICD-10-CM | POA: Diagnosis not present

## 2020-01-10 DIAGNOSIS — B954 Other streptococcus as the cause of diseases classified elsewhere: Secondary | ICD-10-CM | POA: Diagnosis not present

## 2020-01-10 DIAGNOSIS — T8489XD Other specified complication of internal orthopedic prosthetic devices, implants and grafts, subsequent encounter: Secondary | ICD-10-CM | POA: Diagnosis not present

## 2020-01-10 DIAGNOSIS — T84021D Dislocation of internal left hip prosthesis, subsequent encounter: Secondary | ICD-10-CM | POA: Diagnosis not present

## 2020-01-16 DIAGNOSIS — Z9889 Other specified postprocedural states: Secondary | ICD-10-CM | POA: Diagnosis not present

## 2020-01-16 DIAGNOSIS — Z8619 Personal history of other infectious and parasitic diseases: Secondary | ICD-10-CM | POA: Diagnosis not present

## 2020-01-16 DIAGNOSIS — T8452XD Infection and inflammatory reaction due to internal left hip prosthesis, subsequent encounter: Secondary | ICD-10-CM | POA: Diagnosis not present

## 2020-01-17 DIAGNOSIS — T8452XD Infection and inflammatory reaction due to internal left hip prosthesis, subsequent encounter: Secondary | ICD-10-CM | POA: Diagnosis not present

## 2020-03-21 DIAGNOSIS — M069 Rheumatoid arthritis, unspecified: Secondary | ICD-10-CM | POA: Diagnosis not present

## 2020-03-21 DIAGNOSIS — E039 Hypothyroidism, unspecified: Secondary | ICD-10-CM | POA: Diagnosis not present

## 2020-03-21 DIAGNOSIS — F329 Major depressive disorder, single episode, unspecified: Secondary | ICD-10-CM | POA: Diagnosis not present

## 2020-03-21 DIAGNOSIS — D509 Iron deficiency anemia, unspecified: Secondary | ICD-10-CM | POA: Diagnosis not present

## 2020-03-21 DIAGNOSIS — I10 Essential (primary) hypertension: Secondary | ICD-10-CM | POA: Diagnosis not present

## 2020-03-27 DIAGNOSIS — M5416 Radiculopathy, lumbar region: Secondary | ICD-10-CM | POA: Diagnosis not present

## 2020-03-27 DIAGNOSIS — Z96649 Presence of unspecified artificial hip joint: Secondary | ICD-10-CM | POA: Diagnosis not present

## 2020-03-27 DIAGNOSIS — T8452XS Infection and inflammatory reaction due to internal left hip prosthesis, sequela: Secondary | ICD-10-CM | POA: Diagnosis not present

## 2020-03-27 DIAGNOSIS — M169 Osteoarthritis of hip, unspecified: Secondary | ICD-10-CM | POA: Diagnosis not present

## 2020-03-27 DIAGNOSIS — M160 Bilateral primary osteoarthritis of hip: Secondary | ICD-10-CM | POA: Diagnosis not present

## 2020-03-27 DIAGNOSIS — Z87891 Personal history of nicotine dependence: Secondary | ICD-10-CM | POA: Diagnosis not present

## 2020-03-27 DIAGNOSIS — T8452XD Infection and inflammatory reaction due to internal left hip prosthesis, subsequent encounter: Secondary | ICD-10-CM | POA: Diagnosis not present

## 2020-03-27 DIAGNOSIS — T8452XA Infection and inflammatory reaction due to internal left hip prosthesis, initial encounter: Secondary | ICD-10-CM | POA: Diagnosis not present

## 2020-03-27 DIAGNOSIS — M48061 Spinal stenosis, lumbar region without neurogenic claudication: Secondary | ICD-10-CM | POA: Diagnosis not present

## 2020-03-27 DIAGNOSIS — E039 Hypothyroidism, unspecified: Secondary | ICD-10-CM | POA: Diagnosis not present

## 2020-03-27 DIAGNOSIS — M24452 Recurrent dislocation, left hip: Secondary | ICD-10-CM | POA: Diagnosis not present

## 2020-03-27 DIAGNOSIS — Z01818 Encounter for other preprocedural examination: Secondary | ICD-10-CM | POA: Diagnosis not present

## 2020-03-27 DIAGNOSIS — Z96642 Presence of left artificial hip joint: Secondary | ICD-10-CM | POA: Diagnosis not present

## 2020-03-27 DIAGNOSIS — M41126 Adolescent idiopathic scoliosis, lumbar region: Secondary | ICD-10-CM | POA: Diagnosis not present

## 2020-03-27 DIAGNOSIS — F909 Attention-deficit hyperactivity disorder, unspecified type: Secondary | ICD-10-CM | POA: Diagnosis not present

## 2020-04-19 DIAGNOSIS — Z20822 Contact with and (suspected) exposure to covid-19: Secondary | ICD-10-CM | POA: Diagnosis not present

## 2020-04-21 DIAGNOSIS — Z8619 Personal history of other infectious and parasitic diseases: Secondary | ICD-10-CM | POA: Diagnosis not present

## 2020-04-21 DIAGNOSIS — Z96643 Presence of artificial hip joint, bilateral: Secondary | ICD-10-CM | POA: Diagnosis not present

## 2020-04-21 DIAGNOSIS — D649 Anemia, unspecified: Secondary | ICD-10-CM | POA: Diagnosis present

## 2020-04-21 DIAGNOSIS — G8929 Other chronic pain: Secondary | ICD-10-CM | POA: Diagnosis present

## 2020-04-21 DIAGNOSIS — Z96642 Presence of left artificial hip joint: Secondary | ICD-10-CM | POA: Diagnosis not present

## 2020-04-21 DIAGNOSIS — Z4732 Aftercare following explantation of hip joint prosthesis: Secondary | ICD-10-CM | POA: Diagnosis not present

## 2020-04-21 DIAGNOSIS — M21852 Other specified acquired deformities of left thigh: Secondary | ICD-10-CM | POA: Diagnosis present

## 2020-04-21 DIAGNOSIS — G629 Polyneuropathy, unspecified: Secondary | ICD-10-CM | POA: Diagnosis present

## 2020-04-21 DIAGNOSIS — I10 Essential (primary) hypertension: Secondary | ICD-10-CM | POA: Diagnosis not present

## 2020-04-21 DIAGNOSIS — F909 Attention-deficit hyperactivity disorder, unspecified type: Secondary | ICD-10-CM | POA: Diagnosis present

## 2020-04-21 DIAGNOSIS — Z87891 Personal history of nicotine dependence: Secondary | ICD-10-CM | POA: Diagnosis not present

## 2020-04-21 DIAGNOSIS — M351 Other overlap syndromes: Secondary | ICD-10-CM | POA: Diagnosis present

## 2020-04-21 DIAGNOSIS — Z20822 Contact with and (suspected) exposure to covid-19: Secondary | ICD-10-CM | POA: Diagnosis present

## 2020-04-21 DIAGNOSIS — R54 Age-related physical debility: Secondary | ICD-10-CM | POA: Diagnosis present

## 2020-04-21 DIAGNOSIS — M47816 Spondylosis without myelopathy or radiculopathy, lumbar region: Secondary | ICD-10-CM | POA: Diagnosis not present

## 2020-04-21 DIAGNOSIS — G8918 Other acute postprocedural pain: Secondary | ICD-10-CM | POA: Diagnosis not present

## 2020-04-21 DIAGNOSIS — D509 Iron deficiency anemia, unspecified: Secondary | ICD-10-CM | POA: Diagnosis not present

## 2020-04-21 DIAGNOSIS — N951 Menopausal and female climacteric states: Secondary | ICD-10-CM | POA: Diagnosis present

## 2020-04-21 DIAGNOSIS — M069 Rheumatoid arthritis, unspecified: Secondary | ICD-10-CM | POA: Diagnosis not present

## 2020-04-21 DIAGNOSIS — T8452XA Infection and inflammatory reaction due to internal left hip prosthesis, initial encounter: Secondary | ICD-10-CM | POA: Diagnosis not present

## 2020-04-21 DIAGNOSIS — F329 Major depressive disorder, single episode, unspecified: Secondary | ICD-10-CM | POA: Diagnosis not present

## 2020-04-21 DIAGNOSIS — E039 Hypothyroidism, unspecified: Secondary | ICD-10-CM | POA: Diagnosis not present

## 2020-04-21 DIAGNOSIS — Z90710 Acquired absence of both cervix and uterus: Secondary | ICD-10-CM | POA: Diagnosis not present

## 2020-04-21 DIAGNOSIS — T8450XA Infection and inflammatory reaction due to unspecified internal joint prosthesis, initial encounter: Secondary | ICD-10-CM | POA: Diagnosis not present

## 2020-04-23 DIAGNOSIS — M069 Rheumatoid arthritis, unspecified: Secondary | ICD-10-CM | POA: Diagnosis not present

## 2020-04-23 DIAGNOSIS — E039 Hypothyroidism, unspecified: Secondary | ICD-10-CM | POA: Diagnosis not present

## 2020-04-23 DIAGNOSIS — F329 Major depressive disorder, single episode, unspecified: Secondary | ICD-10-CM | POA: Diagnosis not present

## 2020-04-23 DIAGNOSIS — I10 Essential (primary) hypertension: Secondary | ICD-10-CM | POA: Diagnosis not present

## 2020-04-23 DIAGNOSIS — D509 Iron deficiency anemia, unspecified: Secondary | ICD-10-CM | POA: Diagnosis not present

## 2020-05-13 DIAGNOSIS — T8452XD Infection and inflammatory reaction due to internal left hip prosthesis, subsequent encounter: Secondary | ICD-10-CM | POA: Diagnosis not present

## 2020-05-13 DIAGNOSIS — Z96642 Presence of left artificial hip joint: Secondary | ICD-10-CM | POA: Diagnosis not present

## 2020-05-16 DIAGNOSIS — E039 Hypothyroidism, unspecified: Secondary | ICD-10-CM | POA: Diagnosis not present

## 2020-05-16 DIAGNOSIS — F329 Major depressive disorder, single episode, unspecified: Secondary | ICD-10-CM | POA: Diagnosis not present

## 2020-05-16 DIAGNOSIS — D509 Iron deficiency anemia, unspecified: Secondary | ICD-10-CM | POA: Diagnosis not present

## 2020-05-16 DIAGNOSIS — M069 Rheumatoid arthritis, unspecified: Secondary | ICD-10-CM | POA: Diagnosis not present

## 2020-05-16 DIAGNOSIS — I10 Essential (primary) hypertension: Secondary | ICD-10-CM | POA: Diagnosis not present

## 2020-05-19 DIAGNOSIS — Z23 Encounter for immunization: Secondary | ICD-10-CM | POA: Diagnosis not present

## 2020-05-26 ENCOUNTER — Emergency Department (HOSPITAL_COMMUNITY): Payer: Medicare Other

## 2020-05-26 ENCOUNTER — Other Ambulatory Visit: Payer: Self-pay

## 2020-05-26 ENCOUNTER — Emergency Department (HOSPITAL_COMMUNITY)
Admission: EM | Admit: 2020-05-26 | Discharge: 2020-05-26 | Disposition: A | Discharge status: Home / Self Care | Payer: Medicare Other | Attending: Emergency Medicine | Admitting: Emergency Medicine

## 2020-05-26 DIAGNOSIS — E039 Hypothyroidism, unspecified: Secondary | ICD-10-CM | POA: Insufficient documentation

## 2020-05-26 DIAGNOSIS — Z79890 Hormone replacement therapy (postmenopausal): Secondary | ICD-10-CM | POA: Diagnosis not present

## 2020-05-26 DIAGNOSIS — S73005D Unspecified dislocation of left hip, subsequent encounter: Secondary | ICD-10-CM

## 2020-05-26 DIAGNOSIS — X501XXA Overexertion from prolonged static or awkward postures, initial encounter: Secondary | ICD-10-CM | POA: Insufficient documentation

## 2020-05-26 DIAGNOSIS — Z87891 Personal history of nicotine dependence: Secondary | ICD-10-CM | POA: Diagnosis not present

## 2020-05-26 DIAGNOSIS — S73006A Unspecified dislocation of unspecified hip, initial encounter: Secondary | ICD-10-CM

## 2020-05-26 DIAGNOSIS — Z20822 Contact with and (suspected) exposure to covid-19: Secondary | ICD-10-CM | POA: Diagnosis not present

## 2020-05-26 DIAGNOSIS — Z96642 Presence of left artificial hip joint: Secondary | ICD-10-CM | POA: Diagnosis not present

## 2020-05-26 DIAGNOSIS — Z471 Aftercare following joint replacement surgery: Secondary | ICD-10-CM | POA: Diagnosis not present

## 2020-05-26 DIAGNOSIS — T84021A Dislocation of internal left hip prosthesis, initial encounter: Secondary | ICD-10-CM | POA: Diagnosis not present

## 2020-05-26 DIAGNOSIS — S79912D Unspecified injury of left hip, subsequent encounter: Secondary | ICD-10-CM | POA: Diagnosis present

## 2020-05-26 DIAGNOSIS — Y92003 Bedroom of unspecified non-institutional (private) residence as the place of occurrence of the external cause: Secondary | ICD-10-CM | POA: Diagnosis not present

## 2020-05-26 LAB — BASIC METABOLIC PANEL
Anion gap: 7 (ref 5–15)
BUN: 16 mg/dL (ref 8–23)
CO2: 25 mmol/L (ref 22–32)
Calcium: 8.9 mg/dL (ref 8.9–10.3)
Chloride: 102 mmol/L (ref 98–111)
Creatinine, Ser: 0.64 mg/dL (ref 0.44–1.00)
GFR, Estimated: >60 mL/min (ref >60)
Glucose, Bld: 86 mg/dL (ref 70–99)
Potassium: 4.5 mmol/L (ref 3.5–5.1)
Sodium: 134 mmol/L — ABNORMAL LOW (ref 135–145)

## 2020-05-26 LAB — CBC
HCT: 39.8 % (ref 36.0–46.0)
Hemoglobin: 12.8 g/dL (ref 12.0–15.0)
MCH: 31.8 pg (ref 26.0–34.0)
MCHC: 32.2 g/dL (ref 30.0–36.0)
MCV: 99 fL (ref 80.0–100.0)
Platelets: 359 10*3/uL (ref 150–400)
RBC: 4.02 MIL/uL (ref 3.87–5.11)
RDW: 13.5 % (ref 11.5–15.5)
WBC: 5.5 10*3/uL (ref 4.0–10.5)
nRBC: 0 % (ref 0.0–0.2)

## 2020-05-26 LAB — RESPIRATORY PANEL BY RT PCR (FLU A&B, COVID)
Influenza A by PCR: NEGATIVE
Influenza B by PCR: NEGATIVE
SARS Coronavirus 2 by RT PCR: NEGATIVE

## 2020-05-26 MED ORDER — PROPOFOL 10 MG/ML IV BOLUS
0.5000 mg/kg | Freq: Once | INTRAVENOUS | Status: AC
  Administered 2020-05-26: 70 mg via INTRAVENOUS
  Filled 2020-05-26: qty 20

## 2020-05-26 NOTE — ED Triage Notes (Signed)
Pt here with c/o left hip dislocated pt has hx of same , pt turned to the side and it came back out ,

## 2020-05-26 NOTE — ED Provider Notes (Signed)
[facility] EMERGENCY DEPARTMENT Provider Note   CSN: [id number] Arrival date & time: 05/26/20  1441     History No chief complaint on file.   Virginia Gordon is a 69 y.o. female.  With a past medical history of total left hip arthroplasty she has had over 11 dislocations.  In April of this year the patient ended up with  Infection in the prosthesis requiring complete removal of her hardware and extensive treatment with antibiotics.  She had it replaced in September with [facility].  Patient states that she was sitting on her bed today with her feet dangling off.  She reached behind her on the left side by twisting her torso to use her right hand to grab her pen And felt her left hip dislocate.  She took 2 of her oxycodone which she had leftover from surgery states that her pain is currently a 5 out of 10.  She denies numbness or tingling in the foot. HPI     Past Medical History:  Diagnosis Date  . Rheumatoid arthritis [facility])     Patient Active Problem List   Diagnosis Date Noted  . ADD (attention deficit disorder) 11/13/2015  . Connective tissue disease overlap syndrome ([place]) 11/13/2015  . Menopausal and perimenopausal disorder 11/13/2015  . Acne erythematosa 11/13/2015  . Disordered sleep 11/13/2015  . History of operative procedure on hip 07/03/2015  . Left knee pain 05/20/2014  . Right foot pain 05/14/2014  . Bilateral shoulder pain 05/14/2014  . Hypothyroidism 03/08/2014  . Degenerative arthritis of hip 03/07/2012    Past Surgical History:  Procedure Laterality Date  . BREAST BIOPSY Left    pt not sure of date.  Marland Kitchen HIP SURGERY    . SHOULDER SURGERY       OB History    Gravida  1   Para      Term      Preterm      AB      Living        SAB      TAB      Ectopic      Multiple      Live Births              No family history on file.  Social History   Tobacco Use  . Smoking status: Former Research scientist (life sciences)  . Smokeless tobacco:  Never Used  Substance Use Topics  . Alcohol use: Yes    Alcohol/week: 7.0 standard drinks    Types: 7 Standard drinks or equivalent per week  . Drug use: No    Home Medications Prior to Admission medications   Medication Sig Start Date End Date Taking? Authorizing Provider  amphetamine-dextroamphetamine (ADDERALL) 20 MG tablet  10/27/15   [provider]  Ascorbic Acid (VITAMIN C) 500 MG CHEW Chew by mouth.    [provider]  B Complex Vitamins (VITAMIN-B COMPLEX) TABS Take by mouth.    [provider]  calcium carbonate 200 MG capsule Take 250 mg by mouth daily.    [provider]  Cholecalciferol (VITAMIN D-1000 MAX ST) 1000 UNITS tablet Take by mouth.    [provider]  diazepam (VALIUM) 5 MG tablet TAKE 1 TO 2 TABLETS BY MOUTH ONCE DAILY AS NEEDED 08/11/18   [provider]  estradiol (ESTRACE) 1 MG tablet  08/13/14   [provider]  fexofenadine (ALLEGRA) 180 MG tablet Take by mouth.    [provider]  fluticasone (FLONASE) 50 MCG/ACT nasal spray  10/15/13   [provider]  folic acid (FOLVITE) 1 MG tablet  10/12/13   [provider]  gabapentin (NEURONTIN) 600 MG tablet TAKE 1 2 TO 2 TABLETS BY MOUTH NIGHTLY 08/31/18   [provider]  hydroxychloroquine (PLAQUENIL) 200 MG tablet  08/03/14   [provider]  leflunomide (ARAVA) 20 MG tablet Take 20 mg by mouth daily. 08/20/18   [provider]  levothyroxine (SYNTHROID, LEVOTHROID) 75 MCG tablet Take 1 tablet (75 mcg total) by mouth daily before breakfast. **PT NEEDS FOLLOW UP APPT** 07/24/15   Philemon Kingdom, MD  losartan (COZAAR) 50 MG tablet  09/02/15   [provider]  Multiple Vitamin (MULTIVITAMIN) capsule Take by mouth.    [provider]  NIFEdipine (PROCARDIA-XL/ADALAT-CC/NIFEDICAL-XL) 30 MG 24 hr tablet  11/04/15   [provider]  nitroGLYCERIN (NITRODUR - DOSED IN MG/24 HR) 0.2 mg/hr  patch Apply 1/4th patch to affected shoulder, change daily 02/07/19   Hudnall, Sharyn Lull, MD  ondansetron (ZOFRAN ODT) 4 MG disintegrating tablet Take 1 tablet (4 mg total) by mouth every 8 (eight) hours as needed for nausea or vomiting. 05/08/19   Corena Herter, PA-C  traMADol (ULTRAM) 50 MG tablet Take 50 mg by mouth every 6 (six) hours as needed. for pain 01/05/19   [provider]  tretinoin (RETIN-A) 0.1 % cream Apply topically. 02/09/12   [provider]  zolpidem (AMBIEN) 10 MG tablet Take by mouth. 10/10/13   [provider]    Allergies    Vancomycin  Review of Systems   Review of Systems Ten systems reviewed and are negative for acute change, except as noted in the HPI.   Physical Exam Updated Vital Signs BP (!) 142/99 (BP Location: Right Arm)   Pulse 78   Temp 98.5 F (36.9 C) (Oral)   Resp 14   SpO2 100%   Physical Exam Vitals and nursing note reviewed.  Constitutional:      General: She is not in acute distress.    Appearance: She is well-developed. She is not diaphoretic.  HENT:     Head: Normocephalic and atraumatic.  Eyes:     General: No scleral icterus.    Conjunctiva/sclera: Conjunctivae normal.  Cardiovascular:     Rate and Rhythm: Normal rate and regular rhythm.     Heart sounds: Normal heart sounds. No murmur heard.  No friction rub. No gallop.   Pulmonary:     Effort: Pulmonary effort is normal. No respiratory distress.     Breath sounds: Normal breath sounds.  Abdominal:     General: Bowel sounds are normal. There is no distension.     Palpations: Abdomen is soft. There is no mass.     Tenderness: There is no abdominal tenderness. There is no guarding.  Musculoskeletal:     Cervical back: Normal range of motion.     Comments: Left foot is shortened by about 3 inches with internal rotation, DP and PT pulse palpable and 2+ with normal sensation   Skin:    General: Skin is warm and dry.  Neurological:     Mental Status: She  is alert and oriented to person, place, and time.  Psychiatric:        Behavior: Behavior normal.     ED Results / Procedures / Treatments   Labs (all labs ordered are listed, but only abnormal results are displayed) Labs Reviewed - No data to display  EKG None  Radiology No results found.  Procedures Procedures (including critical care time)  Medications Ordered in ED Medications - No data to display  ED Course  I have reviewed the triage vital signs and the nursing notes.  Pertinent labs & imaging results that were available during my care of the patient were reviewed by me and considered in my medical decision making (see chart for details).    MDM Rules/Calculators/A&P                          Patient here with left posterior hip dislocation. I ordered and reviewed images of the left hip which shows a superolateral dislocation.  There are also fractures noted which seem to be consistent with the previous x-ray done on the 13th according to radiologic interpretation.  Minor unable to see the images.  I was able to power push our images to [facility] and discussed the case with Dr. Thurmond Butts.  He advises that we should attempt reduction here.  I have given signout to Dr. Billy Fischer.      Final Clinical Impression(s) / ED Diagnoses Final diagnoses:  Dislocated hip, left, subsequent encounter    Rx / DC Orders ED Discharge Orders    None       Margarita Mail, PA-C 05/26/20 2114    Gareth Morgan, MD 05/29/20 1515

## 2020-05-26 NOTE — Progress Notes (Signed)
Orthopedic Tech Progress Note Patient Details:  Virginia Gordon January 01, 1951 [id number] MD/PA did a reduction of the HIP and afterwards I applied an KNEE IMMOBILIZER  Ortho Devices Type of Ortho Device: Knee Immobilizer Ortho Device/Splint Location: LLE Ortho Device/Splint Interventions: Ordered, Application, Adjustment   Post Interventions Patient Tolerated: Well Instructions Provided: Care of [place] 05/26/2020, 7:12 PM

## 2020-05-26 NOTE — ED Provider Notes (Signed)
Physical Exam  BP 116/79   Pulse 69   Temp 97.7 F (36.5 C) (Oral)   Resp 14   Ht 5\' 4"  (1.626 m)   Wt 56 kg   SpO2 100%   BMI 21.19 kg/m   Physical Exam  ED Course/Procedures     .Sedation  Date/Time: 05/27/2020 4:43 PM Performed by: Gareth Morgan, MD Authorized by: Gareth Morgan, MD   Consent:    Consent obtained:  Verbal   Consent given by:  Patient   Risks discussed:  Allergic reaction, dysrhythmia, inadequate sedation, nausea, prolonged hypoxia resulting in organ damage, prolonged sedation necessitating reversal, respiratory compromise necessitating ventilatory assistance and intubation and vomiting   Alternatives discussed:  Analgesia without sedation, anxiolysis and regional anesthesia Universal protocol:    Procedure explained and questions answered to patient or proxy's satisfaction: yes     Relevant documents present and verified: yes     Test results available and properly labeled: yes     Imaging studies available: yes     Required blood products, implants, devices, and special equipment available: yes     Site/side marked: yes     Immediately prior to procedure a time out was called: yes     Patient identity confirmation method:  Verbally with patient Indications:    Procedure performed:  Dislocation reduction   Procedure necessitating sedation performed by:  Physician performing sedation Pre-sedation assessment:    Time since last food or drink:  4   ASA classification: class 1 - normal, healthy patient     Neck mobility: normal     Mouth opening:  3 or more finger widths   Thyromental distance:  4 finger widths   Mallampati score:  II - soft palate, uvula, fauces visible   Pre-sedation assessments completed and reviewed: airway patency, cardiovascular function, hydration status, mental status, nausea/vomiting, pain level, respiratory function and temperature   Immediate pre-procedure details:    Reassessment: Patient reassessed immediately prior  to procedure     Reviewed: vital signs, relevant labs/tests and NPO status     Verified: bag valve mask available, emergency equipment available, intubation equipment available, IV patency confirmed, oxygen available and suction available   Procedure details (see MAR for exact dosages):    Preoxygenation:  Nasal cannula   Sedation:  Propofol   Intended level of sedation: deep   Intra-procedure monitoring:  Blood pressure monitoring, cardiac monitor, continuous pulse oximetry, frequent LOC assessments, frequent vital sign checks and continuous capnometry   Intra-procedure events: none     Total Provider sedation time (minutes):  20 Post-procedure details:    Attendance: Constant attendance by certified staff until patient recovered     Recovery: Patient returned to pre-procedure baseline     Post-sedation assessments completed and reviewed: airway patency, cardiovascular function, hydration status, mental status, nausea/vomiting, pain level, respiratory function and temperature     Patient is stable for discharge or admission: yes     Patient tolerance:  Tolerated well, no immediate complications Reduction of dislocation  Date/Time: 05/27/2020 4:46 PM Performed by: Gareth Morgan, MD Authorized by: Gareth Morgan, MD  Preparation: Patient was prepped and draped in the usual sterile fashion. Local anesthesia used: no  Anesthesia: Local anesthesia used: no  Sedation: Patient sedated: yes Sedation type: moderate (conscious) sedation Sedatives: propofol  Patient tolerance: patient tolerated the procedure well with no immediate complications   70mg  propofol given over a few minutes  MDM  See Margarita Mail PA-C note for care. Briefly, 69yo female  with complicated orthopedic history presents with left hip dislocation.  NV intact.  Discussed with [place] physician who recommends reduction.  Reduction performed under propofol sedation without difficulty.  Returned to  baseline, NV intact. Placed in knee immobilizer.    Called [facility] physician Dr. Thurmond Butts who reviewed post-reduction films. One view XR however clinical exam without concerns and clinical appearance of reduction.  Noted to have previous fx. Dr. Thurmond Butts recommends continue toe touch weightbearing and close follow up with her Orthopedic physician.      Gareth Morgan, MD 05/28/20 1137

## 2020-05-26 NOTE — ED Notes (Signed)
Pt and husband given updated plan of care.

## 2020-05-26 NOTE — ED Notes (Signed)
Pt discharged via wheelchair with husband. All questions and concerns addressed. No complaints at this time.

## 2020-05-26 NOTE — Discharge Instructions (Addendum)
It was a pleasure caring for you today! You may remain with toe touch weightbearing on your left leg and follow up closely with your Orthopedic team at [facility].

## 2020-05-29 ENCOUNTER — Other Ambulatory Visit: Payer: Self-pay

## 2020-05-29 ENCOUNTER — Emergency Department (HOSPITAL_COMMUNITY): Payer: Medicare Other

## 2020-05-29 ENCOUNTER — Emergency Department (HOSPITAL_COMMUNITY)
Admission: EM | Admit: 2020-05-29 | Discharge: 2020-05-29 | Disposition: A | Discharge status: Home / Self Care | Payer: Medicare Other | Attending: Emergency Medicine | Admitting: Emergency Medicine

## 2020-05-29 ENCOUNTER — Encounter (HOSPITAL_COMMUNITY): Payer: Self-pay | Admitting: Emergency Medicine

## 2020-05-29 DIAGNOSIS — X501XXA Overexertion from prolonged static or awkward postures, initial encounter: Secondary | ICD-10-CM | POA: Diagnosis not present

## 2020-05-29 DIAGNOSIS — Y9389 Activity, other specified: Secondary | ICD-10-CM | POA: Insufficient documentation

## 2020-05-29 DIAGNOSIS — Y92000 Kitchen of unspecified non-institutional (private) residence as  the place of occurrence of the external cause: Secondary | ICD-10-CM | POA: Insufficient documentation

## 2020-05-29 DIAGNOSIS — Z87891 Personal history of nicotine dependence: Secondary | ICD-10-CM | POA: Insufficient documentation

## 2020-05-29 DIAGNOSIS — T84021A Dislocation of internal left hip prosthesis, initial encounter: Secondary | ICD-10-CM | POA: Diagnosis not present

## 2020-05-29 DIAGNOSIS — S73005A Unspecified dislocation of left hip, initial encounter: Secondary | ICD-10-CM | POA: Insufficient documentation

## 2020-05-29 DIAGNOSIS — E039 Hypothyroidism, unspecified: Secondary | ICD-10-CM | POA: Insufficient documentation

## 2020-05-29 DIAGNOSIS — Z8776 Personal history of (corrected) congenital malformations of integument, limbs and musculoskeletal system: Secondary | ICD-10-CM

## 2020-05-29 DIAGNOSIS — R52 Pain, unspecified: Secondary | ICD-10-CM

## 2020-05-29 DIAGNOSIS — Z87768 Personal history of other specified (corrected) congenital malformations of integument, limbs and musculoskeletal system: Secondary | ICD-10-CM

## 2020-05-29 MED ORDER — PROPOFOL 10 MG/ML IV BOLUS
INTRAVENOUS | Status: AC | PRN
  Administered 2020-05-29: 2504 ug via INTRAVENOUS
  Administered 2020-05-29 (×2): 1252 ug via INTRAVENOUS

## 2020-05-29 MED ORDER — PROPOFOL 10 MG/ML IV BOLUS
40.0000 mg | Freq: Once | INTRAVENOUS | Status: DC
  Filled 2020-05-29: qty 20

## 2020-05-29 MED ORDER — SODIUM CHLORIDE 0.9 % IV SOLN
INTRAVENOUS | Status: AC | PRN
  Administered 2020-05-29: 10 mL/h via INTRAVENOUS

## 2020-05-29 MED ORDER — PROPOFOL 10 MG/ML IV BOLUS
INTRAVENOUS | Status: AC
  Filled 2020-05-29: qty 20

## 2020-05-29 NOTE — ED Triage Notes (Signed)
Pt states she has a left hip dislocation while she was sitting on her kitchen. Pt seen prior this week for same.

## 2020-05-29 NOTE — Discharge Instructions (Addendum)
You were seen in the emergency department today with left hip dislocation.  We have fitted you with a different kind of splint to try and keep this from coming out again.  Please call your team at [facility] to make them aware.  Return to the emergency department any new or suddenly worsening symptoms.

## 2020-05-29 NOTE — Progress Notes (Signed)
Orthopedic Tech Progress Note Patient Details:  Virginia Gordon Feb 15, 1951 [id number] Applied knee immobilizer after provider reduced hip Ortho Devices Type of Ortho Device: Knee Immobilizer Ortho Device/Splint Location: LLE Ortho Device/Splint Interventions: Ordered, Application, Adjustment   Post Interventions Patient Tolerated: Well Instructions Provided: Care of device, Poper ambulation with device, Adjustment of device   [name] 05/29/2020, 3:59 PM

## 2020-05-29 NOTE — ED Provider Notes (Signed)
Emergency Department Provider Note   I have reviewed the triage vital signs and the nursing notes.   HISTORY  Chief Complaint Hip Pain   HPI Virginia Gordon is a 69 y.o. female with past medical history reviewed below including multiple recent ED visits for left hip dislocation.  This hip was recently revised after it became infected.  The surgery was performed at [facility] in April with the orthopedic service.  She remains on amoxicillin 3 times daily but has completed her IV antibiotic course.  She is not had fevers.  Last night, she was sitting on a chair with her leg extended out in front of her.  She does not recall moving or shifting but states she must have and she felt her leg pop out of joint on the left.  She did not have severe pain and ultimately decided not to present to the emergency department immediately.    Past Medical History:  Diagnosis Date  . Rheumatoid arthritis [facility])     Patient Active Problem List   Diagnosis Date Noted  . ADD (attention deficit disorder) 11/13/2015  . Connective tissue disease overlap syndrome ([place]) 11/13/2015  . Menopausal and perimenopausal disorder 11/13/2015  . Acne erythematosa 11/13/2015  . Disordered sleep 11/13/2015  . History of operative procedure on hip 07/03/2015  . Left knee pain 05/20/2014  . Right foot pain 05/14/2014  . Bilateral shoulder pain 05/14/2014  . Hypothyroidism 03/08/2014  . Degenerative arthritis of hip 03/07/2012    Past Surgical History:  Procedure Laterality Date  . BREAST BIOPSY Left    pt not sure of date.  Marland Kitchen HIP SURGERY    . SHOULDER SURGERY      Allergies Vancomycin  No family history on file.  Social History Social History   Tobacco Use  . Smoking status: Former Research scientist (life sciences)  . Smokeless tobacco: Never Used  Substance Use Topics  . Alcohol use: Yes    Alcohol/week: 7.0 standard drinks    Types: 7 Standard drinks or equivalent per week  . Drug use: No    Review of Systems  Constitutional:  No fever/chills Eyes: No visual changes. ENT: No sore throat. Cardiovascular: Denies chest pain. Respiratory: Denies shortness of breath. Gastrointestinal: No abdominal pain.  No nausea, no vomiting.  No diarrhea.  No constipation. Genitourinary: Negative for dysuria. Musculoskeletal: Positive left hip pain.  Skin: Negative for rash. Neurological: Negative for headaches, focal weakness or numbness.  10-point ROS otherwise negative.  ____________________________________________   PHYSICAL EXAM:  VITAL SIGNS: ED Triage Vitals  Enc Vitals Group     BP 05/29/20 1205 133/63     Pulse Rate 05/29/20 1205 71     Resp 05/29/20 1205 14     Temp 05/29/20 1205 97.6 F (36.4 C)     Temp Source 05/29/20 1205 Oral     SpO2 05/29/20 1205 100 %     Weight 05/29/20 1205 138 lb (62.6 kg)     Height 05/29/20 1205 5\' 4"  (1.626 m)   Constitutional: Alert and oriented. Well appearing and in no acute distress. Eyes: Conjunctivae are normal. Head: Atraumatic. Nose: No congestion/rhinnorhea. Mouth/Throat: Mucous membranes are moist.   Neck: No stridor.   Cardiovascular: Good peripheral circulation.  Respiratory: Normal respiratory effort.  Gastrointestinal: No distention.  Musculoskeletal: LLE shortening. Pain with any attempted ROM.  Neurologic:  Normal speech and language. No gross focal neurologic deficits are appreciated.  Skin:  Skin is warm, dry and intact. No rash noted.  ____________________________________________  RADIOLOGY  DG HIP UNILAT WITH PELVIS 1V LEFT  Result Date: 05/29/2020 CLINICAL DATA:  Left hip dislocation, post reduction EXAM: DG HIP (WITH OR WITHOUT PELVIS) 1V*L* COMPARISON:  Radiograph 05/29/2020 FINDINGS: Successful interval relocation of the articulating left femoral component into the screw fixed acetabular cup. Extensive changes from prior resection and revision of the left hip arthroplasty are again noted in comparison to more remote imaging from April of 2021  including partial resection of the pubic root and ilium on the left with marked degree of acetabular protrusio as well as additional lucency and resection along the left femoral stem. A right hip arthroplasty is unchanged in appearance from prior. Additional degenerative changes in the lumbar spine and SI joints and symphysis pubis. IMPRESSION: 1. Successful interval relocation of the articulating left femoral component into the screw fixed acetabular cup. 2. Extensive changes from prior resection and revision of the left hip arthroplasty with acetabular protrusio. 3. Right hip arthroplasty is unchanged priors. 4. Additional degenerative changes in the lumbar spine and pelvis. Electronically Signed   By: Lovena Le M.D.   On: 05/29/2020 16:02   DG HIP UNILAT WITH PELVIS 2-3 VIEWS LEFT  Result Date: 05/29/2020 CLINICAL DATA:  Left hip dislocation EXAM: DG HIP (WITH OR WITHOUT PELVIS) 2-3V LEFT COMPARISON:  05/26/2020 FINDINGS: Recurrent dislocation of the femoral component of the left hip arthroplasty. The component is lateral and superior to the revised acetabular cup. Similar left acetabular protrusio and prior acetabular fracture. IMPRESSION: Recurrent dislocation of the femoral component of the left hip arthroplasty. Electronically Signed   By: Macy Mis M.D.   On: 05/29/2020 12:28    ____________________________________________   PROCEDURES  Procedure(s) performed:   .Sedation  Date/Time: 05/30/2020 9:26 AM Performed by: Margette Fast, MD Authorized by: Margette Fast, MD   Consent:    Consent obtained:  Verbal   Consent given by:  Patient   Risks discussed:  Allergic reaction, dysrhythmia, inadequate sedation, nausea, prolonged hypoxia resulting in organ damage, prolonged sedation necessitating reversal, respiratory compromise necessitating ventilatory assistance and intubation and vomiting   Alternatives discussed:  Analgesia without sedation, anxiolysis and regional  anesthesia Universal protocol:    Procedure explained and questions answered to patient or proxy's satisfaction: yes     Relevant documents present and verified: yes     Test results available and properly labeled: yes     Imaging studies available: yes     Required blood products, implants, devices, and special equipment available: yes     Site/side marked: yes     Immediately prior to procedure a time out was called: yes     Patient identity confirmation method:  Verbally with patient Indications:    Procedure necessitating sedation performed by:  Physician performing sedation Pre-sedation assessment:    Time since last food or drink:  5 hours   ASA classification: class 2 - patient with mild systemic disease     Neck mobility: normal     Mouth opening:  3 or more finger widths   Thyromental distance:  4 finger widths   Mallampati score:  I - soft palate, uvula, fauces, pillars visible   Pre-sedation assessments completed and reviewed: airway patency, cardiovascular function, hydration status, mental status, nausea/vomiting, pain level, respiratory function and temperature   Immediate pre-procedure details:    Reassessment: Patient reassessed immediately prior to procedure     Reviewed: vital signs, relevant labs/tests and NPO status     Verified: bag valve mask available,  emergency equipment available, intubation equipment available, IV patency confirmed, oxygen available and suction available   Procedure details (see MAR for exact dosages):    Preoxygenation:  Nasal cannula   Sedation:  Propofol   Intended level of sedation: deep   Intra-procedure monitoring:  Blood pressure monitoring, cardiac monitor, continuous pulse oximetry, frequent LOC assessments, frequent vital sign checks and continuous capnometry   Intra-procedure events: none     Total Provider sedation time (minutes):  25 Post-procedure details:    Attendance: Constant attendance by certified staff until patient  recovered     Recovery: Patient returned to pre-procedure baseline     Post-sedation assessments completed and reviewed: airway patency, cardiovascular function, hydration status, mental status, nausea/vomiting, pain level, respiratory function and temperature     Patient is stable for discharge or admission: yes     Patient tolerance:  Tolerated well, no immediate complications Comments:     80 mg total propofol over 2 min  Reduction of dislocation  Date/Time: 05/30/2020 9:26 AM Performed by: Margette Fast, MD Authorized by: Margette Fast, MD  Preparation: Patient was prepped and draped in the usual sterile fashion. Local anesthesia used: no  Anesthesia: Local anesthesia used: no  Sedation: Patient sedated: yes Sedation type: (Deep) Sedatives: propofol Vitals: Vital signs were monitored during sedation.  Patient tolerance: patient tolerated the procedure well with no immediate complications Comments: Knee flexed with steady traction applied longitudinally.  After adequate sedation the hip was felt to palpably reduce and length restored and ROM normal.       ____________________________________________   INITIAL IMPRESSION / ASSESSMENT AND PLAN / ED COURSE  Pertinent labs & imaging results that were available during my care of the patient were reviewed by me and considered in my medical decision making (see chart for details).   Patient presents emergency department with recurrent hip dislocation on the left.  She is followed by orthopedics at [facility] and has an appointment next week with them to discuss these recurrent dislocations.  Plain film from triage confirms superior dislocation of the left hip.  After obtaining informed consent the patient was sedated in the hip was reduced.  I spoke with our outside orthotics vendor to have the patient fitted with a hip abductor brace rather than knee immobilizer.  She is in agreement with trying this brace to prevent continued  dislocations.  She has made her orthopedics team at [facility] aware of her recurrent dislocation and will follow up ASAP. Can d/c after brace applied.    ____________________________________________  FINAL CLINICAL IMPRESSION(S) / ED DIAGNOSES  Final diagnoses:  Hip dislocation - left  Dislocation of left hip, initial encounter ([place])    MEDICATIONS GIVEN DURING THIS VISIT:  Medications  propofol (DIPRIVAN) 10 mg/mL bolus/IV push (1,252 mcg Intravenous Given 05/29/20 1447)  0.9 %  sodium chloride infusion ( Intravenous Stopped 05/29/20 1553)    Note:  This document was prepared using Dragon voice recognition software and may include unintentional dictation errors.  Nanda Quinton, MD, [facility] Emergency Medicine    Randen Kauth, [facility], MD 05/30/20 [phone]

## 2020-05-30 DIAGNOSIS — S73005A Unspecified dislocation of left hip, initial encounter: Secondary | ICD-10-CM | POA: Diagnosis not present

## 2020-06-06 DIAGNOSIS — T8452XD Infection and inflammatory reaction due to internal left hip prosthesis, subsequent encounter: Secondary | ICD-10-CM | POA: Diagnosis not present

## 2020-06-06 DIAGNOSIS — Z96642 Presence of left artificial hip joint: Secondary | ICD-10-CM | POA: Diagnosis not present

## 2020-06-09 ENCOUNTER — Emergency Department (HOSPITAL_COMMUNITY): Payer: Medicare Other

## 2020-06-09 ENCOUNTER — Other Ambulatory Visit: Payer: Self-pay

## 2020-06-09 ENCOUNTER — Encounter (HOSPITAL_COMMUNITY): Payer: Self-pay | Admitting: Emergency Medicine

## 2020-06-09 ENCOUNTER — Emergency Department (HOSPITAL_COMMUNITY)
Admission: EM | Admit: 2020-06-09 | Discharge: 2020-06-09 | Disposition: A | Discharge status: Home / Self Care | Payer: Medicare Other | Attending: Emergency Medicine | Admitting: Emergency Medicine

## 2020-06-09 DIAGNOSIS — E039 Hypothyroidism, unspecified: Secondary | ICD-10-CM | POA: Diagnosis not present

## 2020-06-09 DIAGNOSIS — T148XXA Other injury of unspecified body region, initial encounter: Secondary | ICD-10-CM

## 2020-06-09 DIAGNOSIS — S73005A Unspecified dislocation of left hip, initial encounter: Secondary | ICD-10-CM

## 2020-06-09 DIAGNOSIS — M069 Rheumatoid arthritis, unspecified: Secondary | ICD-10-CM | POA: Diagnosis not present

## 2020-06-09 DIAGNOSIS — Y69 Unspecified misadventure during surgical and medical care: Secondary | ICD-10-CM | POA: Insufficient documentation

## 2020-06-09 DIAGNOSIS — T84028A Dislocation of other internal joint prosthesis, initial encounter: Secondary | ICD-10-CM | POA: Diagnosis not present

## 2020-06-09 DIAGNOSIS — Z79899 Other long term (current) drug therapy: Secondary | ICD-10-CM | POA: Insufficient documentation

## 2020-06-09 DIAGNOSIS — T84021A Dislocation of internal left hip prosthesis, initial encounter: Secondary | ICD-10-CM | POA: Diagnosis not present

## 2020-06-09 MED ORDER — PROPOFOL 10 MG/ML IV BOLUS
0.5000 mg/kg | Freq: Once | INTRAVENOUS | Status: DC
  Filled 2020-06-09: qty 20

## 2020-06-09 MED ORDER — PROPOFOL 10 MG/ML IV BOLUS
INTRAVENOUS | Status: AC | PRN
  Administered 2020-06-09: 60 mg via INTRAVENOUS

## 2020-06-09 MED ORDER — PROPOFOL 10 MG/ML IV BOLUS
INTRAVENOUS | Status: AC | PRN
  Administered 2020-06-09 (×2): 40 mg via INTRAVENOUS

## 2020-06-09 MED ORDER — OXYCODONE-ACETAMINOPHEN 5-325 MG PO TABS
1.0000 | ORAL_TABLET | Freq: Once | ORAL | Status: AC
  Administered 2020-06-09: 1 via ORAL
  Filled 2020-06-09: qty 1

## 2020-06-09 NOTE — ED Notes (Signed)
Patient states her left hip started hurting Sat. Night while laying in the bed. Denies injury. States she was seen by her Ortho in [place] on fri . Patient states she is able to ambulate. Left leg is shortened and slightly rotated outward.

## 2020-06-09 NOTE — ED Notes (Signed)
IV hurting on right wrist , restarted IV

## 2020-06-09 NOTE — ED Provider Notes (Signed)
[facility] EMERGENCY DEPARTMENT Provider Note   CSN: [id number] Arrival date & time: 06/09/20  1006     History No chief complaint on file.   Virginia Gordon is a 69 y.o. female.  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  Patient has had dislocation x2 both seen and reduced here in the ED.  She is followed up with her orthopedist at [facility].  She had not dislocated after abduction orthosis was ordered by her orthopedist.  However, Saturday night dislocated and has waited to come in to ED.  She states she thinks it only partially dislocated initially but felt like hip dislocated during day despite abduction orthosis.  Patient contacted her orthopedist yesterday and presents for dislocation today. Denies significant pain, numbness, decreased rom c.w. dislocation.  covid vaccine and booster No current immunosuppression [facility]  Past Medical History:  Diagnosis Date  . Rheumatoid arthritis [facility])     Patient Active Problem List   Diagnosis Date Noted  . ADD (attention deficit disorder) 11/13/2015  . Connective tissue disease overlap syndrome ([place]) 11/13/2015  . Menopausal and perimenopausal disorder 11/13/2015  . Acne erythematosa 11/13/2015  . Disordered sleep 11/13/2015  . History of operative procedure on hip 07/03/2015  . Left knee pain 05/20/2014  . Right foot pain 05/14/2014  . Bilateral shoulder pain 05/14/2014  . Hypothyroidism 03/08/2014  . Degenerative arthritis of hip 03/07/2012    Past Surgical History:  Procedure Laterality Date  . BREAST BIOPSY Left    pt not sure of date.  Marland Kitchen HIP SURGERY    . SHOULDER SURGERY       OB History    Gravida  1   Para      Term      Preterm      AB      Living        SAB      TAB      Ectopic      Multiple      Live Births              No family history on  file.  Social History   Tobacco Use  . Smoking status: Former Research scientist (life sciences)  . Smokeless tobacco: Never Used  Substance Use Topics  . Alcohol use: Yes    Alcohol/week: 7.0 standard drinks    Types: 7 Standard drinks or equivalent per week  . Drug use: No    Home Medications Prior to Admission medications   Medication Sig Start Date End Date Taking? Authorizing Provider  amphetamine-dextroamphetamine (ADDERALL) 20 MG tablet  10/27/15   [provider]  Ascorbic Acid (VITAMIN C) 500 MG CHEW Chew by mouth.    [provider]  B Complex Vitamins (VITAMIN-B COMPLEX) TABS Take by mouth.    [provider]  calcium carbonate 200 MG capsule Take 250 mg by mouth daily.    [provider]  Cholecalciferol (VITAMIN D-1000 MAX ST) 1000 UNITS tablet Take by mouth.    [provider]  diazepam (VALIUM) 5 MG tablet TAKE 1 TO 2 TABLETS BY MOUTH ONCE DAILY AS NEEDED 08/11/18   [provider]  estradiol (ESTRACE) 1 MG tablet  08/13/14   [provider]  fexofenadine (ALLEGRA) 180 MG tablet Take by mouth.    [provider]  fluticasone Asencion Islam) 50 MCG/ACT nasal spray  10/15/13  [provider]  folic acid (FOLVITE) 1 MG tablet  10/12/13   [provider]  gabapentin (NEURONTIN) 600 MG tablet TAKE 1 2 TO 2 TABLETS BY MOUTH NIGHTLY 08/31/18   [provider]  hydroxychloroquine (PLAQUENIL) 200 MG tablet  08/03/14   [provider]  leflunomide (ARAVA) 20 MG tablet Take 20 mg by mouth daily. 08/20/18   [provider]  levothyroxine (SYNTHROID, LEVOTHROID) 75 MCG tablet Take 1 tablet (75 mcg total) by mouth daily before breakfast. **PT NEEDS FOLLOW UP APPT** 07/24/15   Philemon Kingdom, MD  losartan (COZAAR) 50 MG tablet  09/02/15   [provider]  Multiple Vitamin (MULTIVITAMIN) capsule Take by mouth.    [provider]  NIFEdipine (PROCARDIA-XL/ADALAT-CC/NIFEDICAL-XL) 30 MG 24 hr  tablet  11/04/15   [provider]  nitroGLYCERIN (NITRODUR - DOSED IN MG/24 HR) 0.2 mg/hr patch Apply 1/4th patch to affected shoulder, change daily 02/07/19   Hudnall, Sharyn Lull, MD  ondansetron (ZOFRAN ODT) 4 MG disintegrating tablet Take 1 tablet (4 mg total) by mouth every 8 (eight) hours as needed for nausea or vomiting. 05/08/19   Corena Herter, PA-C  traMADol (ULTRAM) 50 MG tablet Take 50 mg by mouth every 6 (six) hours as needed. for pain 01/05/19   [provider]  tretinoin (RETIN-A) 0.1 % cream Apply topically. 02/09/12   [provider]  zolpidem (AMBIEN) 10 MG tablet Take by mouth. 10/10/13   [provider]    Allergies    Vancomycin  Review of Systems   Review of Systems  All other systems reviewed and are negative.   Physical Exam Updated Vital Signs BP 115/80   Pulse 75   Temp 97.8 F (36.6 C) (Oral)   Resp 15   SpO2 98%   Physical Exam Vitals and nursing note reviewed.  Constitutional:      Appearance: She is normal weight.  HENT:     Head: Normocephalic.     Right Ear: External ear normal.     Left Ear: External ear normal.     Nose: Nose normal.     Mouth/Throat:     Mouth: Mucous membranes are moist.  Eyes:     Extraocular Movements: Extraocular movements intact.     Pupils: Pupils are equal, round, and reactive to light.  Cardiovascular:     Rate and Rhythm: Normal rate and regular rhythm.     Pulses: Normal pulses.  Pulmonary:     Effort: Pulmonary effort is normal.     Breath sounds: Normal breath sounds.  Abdominal:     General: Abdomen is flat.     Palpations: Abdomen is soft.  Musculoskeletal:     Cervical back: Normal range of motion.     Comments: Left hip deformity Pulses intact Sensation intact No redness or warmth  Skin:    General: Skin is warm.     Capillary Refill: Capillary refill takes less than 2 seconds.  Neurological:     General: No focal deficit present.     Mental Status: She is alert.   Psychiatric:        Mood and Affect: Mood normal.     ED Results / Procedures / Treatments   Labs (all labs ordered are listed, but only abnormal results are displayed) Labs Reviewed - No data to display  EKG None  Radiology DG Hip [place] W or [place] Pelvis 1 View Left  Result Date: 06/09/2020 CLINICAL DATA:  Left hip  arthroplasty dislocation status post reduction EXAM: DG HIP (WITH OR WITHOUT PELVIS) 1V PORT LEFT COMPARISON:  Same day radiographs FINDINGS: Significantly limited exam. The left hip was almost entirely excluded from the field of view on AP image. Lateral view is also limited as portion of the hip prosthesis was excluded. Femoral and acetabular components appear to be aligned on the lateral view, poorly evaluated. IMPRESSION: Significantly limited exam, as described. Recommend repeat frontal view of the pelvis and left hip. Electronically Signed   By: Davina Poke D.O.   On: 06/09/2020 13:29   DG HIP UNILAT WITH PELVIS 2-3 VIEWS LEFT  Result Date: 06/09/2020 CLINICAL DATA:  [place]. Left hip dislocation. EXAM: DG HIP (WITH OR WITHOUT PELVIS) 2-3V LEFT COMPARISON:  05/29/2020 FINDINGS: Recurrent dislocation of the left femoral prosthesis superiorly and posteriorly. Stable erosive changes around the acetabular and femoral components. No acute fracture. IMPRESSION: Recurrent dislocation of the left femoral prosthesis. Electronically Signed   By: Marijo Sanes M.D.   On: 06/09/2020 10:52    Procedures .Sedation  Date/Time: 06/09/2020 11:47 AM Performed by: Pattricia Boss, MD Authorized by: Pattricia Boss, MD   Consent:    Consent obtained:  Written   Consent given by:  Patient   Risks discussed:  Allergic reaction, dysrhythmia, inadequate sedation, nausea, prolonged hypoxia resulting in organ damage, prolonged sedation necessitating reversal, respiratory compromise necessitating ventilatory assistance and intubation and vomiting   Alternatives discussed:  Analgesia  without sedation, anxiolysis and regional anesthesia Universal protocol:    Procedure explained and questions answered to patient or proxy's satisfaction: yes     Relevant documents present and verified: yes     Test results available and properly labeled: yes     Imaging studies available: yes     Required blood products, implants, devices, and special equipment available: yes     Site/side marked: yes     Immediately prior to procedure a time out was called: yes     Patient identity confirmation method:  Verbally with patient Indications:    Procedure performed:  Dislocation reduction   Procedure necessitating sedation performed by:  Physician performing sedation Pre-sedation assessment:    Time since last food or drink:  4   ASA classification: class 2 - patient with mild systemic disease     Neck mobility: normal     Mouth opening:  2 finger widths   Thyromental distance:  4 finger widths   Mallampati score:  II - soft palate, uvula, fauces visible   Pre-sedation assessments completed and reviewed: airway patency, cardiovascular function, hydration status, mental status, nausea/vomiting, pain level, respiratory function and temperature   Immediate pre-procedure details:    Reassessment: Patient reassessed immediately prior to procedure     Reviewed: vital signs, relevant labs/tests and NPO status     Verified: bag valve mask available, emergency equipment available, intubation equipment available, IV patency confirmed, oxygen available and suction available   Procedure details (see MAR for exact dosages):    Preoxygenation:  Nasal cannula   Sedation:  Propofol   Intended level of sedation: deep   Intra-procedure monitoring:  Blood pressure monitoring, cardiac monitor, continuous pulse oximetry, frequent LOC assessments, frequent vital sign checks and continuous capnometry   Intra-procedure events: none     Total Provider sedation time (minutes):  32 Post-procedure details:     Attendance: Constant attendance by certified staff until patient recovered     Recovery: Patient returned to pre-procedure baseline     Post-sedation assessments completed and  reviewed: airway patency, cardiovascular function, hydration status, mental status, nausea/vomiting, pain level, respiratory function and temperature     Patient is stable for discharge or admission: yes     Patient tolerance:  Tolerated well, no immediate complications  Reduction of dislocation  Date/Time: 06/09/2020 12:25 PM Performed by: Pattricia Boss, MD Authorized by: Pattricia Boss, MD  Local anesthesia used: no  Anesthesia: Local anesthesia used: no  Sedation: Patient sedated: yes Sedation type: (Deep) Sedatives: propofol Sedation start date/time: 06/09/2020 12:25 PM Sedation end date/time: 06/09/2020 12:57 PM  Patient tolerance: patient tolerated the procedure well with no immediate complications    (including critical care time)  Medications Ordered in ED Medications  propofol (DIPRIVAN) 10 mg/mL bolus/IV push 31.3 mg (has no administration in time range)    ED Course  I have reviewed the triage vital signs and the nursing notes.  Pertinent labs & imaging results that were available during my care of the patient were reviewed by me and considered in my medical decision making (see chart for details).    MDM Rules/Calculators/A&P                          Patient with pain right arm when propofol infused Arm without evidence of infiltration IV restarted left arm and propofol 60 mg given iv with good sedation Patient with some left hip pain after reduction.  Percocet ordered Hip orthotic tech at bedside and adjusting hip abductor  Awaiting results of post reduction x-Rossana Molchan-hip reduced with x-Jeremian Whitby c.w. prior x-rays  Final Clinical Impression(s) / ED Diagnoses Final diagnoses:  Dislocation closed    Rx / DC Orders ED Discharge Orders    None       Pattricia Boss, MD 06/11/20 1321

## 2020-06-09 NOTE — Progress Notes (Signed)
RT remained bedside for a conscious sedation procedure until patient became more awake.  Suction was set-up and all airway equipment was at bedside if needed.

## 2020-06-09 NOTE — Discharge Instructions (Signed)
Please use hip abduction immobilizer at all times Call your orthopedist for follow up asap Return to the ED if any problems- recurrent dislocation, pain, weakness.

## 2020-06-09 NOTE — ED Triage Notes (Signed)
Pt here with a chronic left hip dislocation , 3rd dislocation in about 3 weeks

## 2020-06-09 NOTE — Progress Notes (Signed)
Orthopedic Tech Progress Note Patient Details:  Virginia Gordon [birth year] [id number] Was called to assist with REDUCTION of hip.Marland Kitchen applied HIP BRACE back on Patient ID: Virginia Gordon, female   DOB: 1951-03-05, 69 y.o.   MRN: [id number]   Virginia Gordon 06/09/2020, 1:13 PM

## 2020-06-13 DIAGNOSIS — F902 Attention-deficit hyperactivity disorder, combined type: Secondary | ICD-10-CM | POA: Diagnosis not present

## 2020-06-26 ENCOUNTER — Emergency Department (HOSPITAL_COMMUNITY): Payer: Medicare Other

## 2020-06-26 ENCOUNTER — Other Ambulatory Visit: Payer: Self-pay

## 2020-06-26 ENCOUNTER — Encounter (HOSPITAL_COMMUNITY): Payer: Self-pay | Admitting: *Deleted

## 2020-06-26 ENCOUNTER — Emergency Department (HOSPITAL_COMMUNITY)
Admission: EM | Admit: 2020-06-26 | Discharge: 2020-06-26 | Disposition: A | Discharge status: Home / Self Care | Payer: Medicare Other | Attending: Emergency Medicine | Admitting: Emergency Medicine

## 2020-06-26 DIAGNOSIS — E039 Hypothyroidism, unspecified: Secondary | ICD-10-CM | POA: Diagnosis not present

## 2020-06-26 DIAGNOSIS — S73015A Posterior dislocation of left hip, initial encounter: Secondary | ICD-10-CM | POA: Insufficient documentation

## 2020-06-26 DIAGNOSIS — T84021A Dislocation of internal left hip prosthesis, initial encounter: Secondary | ICD-10-CM | POA: Diagnosis not present

## 2020-06-26 DIAGNOSIS — Z87891 Personal history of nicotine dependence: Secondary | ICD-10-CM | POA: Insufficient documentation

## 2020-06-26 DIAGNOSIS — Z79899 Other long term (current) drug therapy: Secondary | ICD-10-CM | POA: Diagnosis not present

## 2020-06-26 DIAGNOSIS — X58XXXA Exposure to other specified factors, initial encounter: Secondary | ICD-10-CM | POA: Insufficient documentation

## 2020-06-26 DIAGNOSIS — S79912A Unspecified injury of left hip, initial encounter: Secondary | ICD-10-CM | POA: Diagnosis present

## 2020-06-26 DIAGNOSIS — S73005A Unspecified dislocation of left hip, initial encounter: Secondary | ICD-10-CM

## 2020-06-26 DIAGNOSIS — T84028A Dislocation of other internal joint prosthesis, initial encounter: Secondary | ICD-10-CM | POA: Diagnosis not present

## 2020-06-26 MED ORDER — PROPOFOL 10 MG/ML IV BOLUS
40.0000 mg | Freq: Once | INTRAVENOUS | Status: AC
  Administered 2020-06-26: 115 mg via INTRAVENOUS
  Filled 2020-06-26: qty 20

## 2020-06-26 MED ORDER — KETAMINE HCL 50 MG/5ML IJ SOSY
50.0000 mg | PREFILLED_SYRINGE | Freq: Once | INTRAMUSCULAR | Status: DC
  Filled 2020-06-26: qty 5

## 2020-06-26 MED ORDER — PROPOFOL 10 MG/ML IV BOLUS
INTRAVENOUS | Status: AC | PRN
  Administered 2020-06-26: 75 mg via INTRAVENOUS
  Administered 2020-06-26: 40 mg via INTRAVENOUS

## 2020-06-26 NOTE — Progress Notes (Signed)
Orthopedic Tech Progress Note Patient Details:  Virginia Gordon 1951/03/11 [id number] Assisted MD with hip reduction  Patient ID: Virginia Gordon, female   DOB: [birth year], 69 y.o.   MRN: [id number]   Virginia Gordon 06/26/2020, 12:46 PM

## 2020-06-26 NOTE — Discharge Instructions (Signed)
Your hip was reduced in the ER.  Please read the instructions provided on conscious sedation.  Try to take it easy today to prevent falls or dislocation. Please inform your orthopedist about the ER visit today so that they can further help you with your hip apparatus.

## 2020-06-26 NOTE — ED Triage Notes (Signed)
Pt here with a chronic hip dislocation.  Seen here on 11/8 for same.

## 2020-06-26 NOTE — ED Provider Notes (Signed)
[facility] EMERGENCY DEPARTMENT Provider Note   CSN: [id number] Arrival date & time: 06/26/20  1033     History Chief Complaint  Patient presents with  . Hip Injury    Virginia Gordon is a 69 y.o. female.  HPI     69 year old female with history of RA, recurrent hip dislocation on the left side comes in with acute hip dislocation.  Patient reports that she was putting on her prosthesis when the hip suddenly dislocated.  She has had several episodes of hip dislocation.  Her primary orthopedist is at [facility].  Her last visit to the ER was earlier this month.  Patient did have some beverage to drink while in the ER.  She has not had any large meal today.  No known cardiac or lung disease.  Past Medical History:  Diagnosis Date  . Rheumatoid arthritis [facility])     Patient Active Problem List   Diagnosis Date Noted  . ADD (attention deficit disorder) 11/13/2015  . Connective tissue disease overlap syndrome ([place]) 11/13/2015  . Menopausal and perimenopausal disorder 11/13/2015  . Acne erythematosa 11/13/2015  . Disordered sleep 11/13/2015  . History of operative procedure on hip 07/03/2015  . Left knee pain 05/20/2014  . Right foot pain 05/14/2014  . Bilateral shoulder pain 05/14/2014  . Hypothyroidism 03/08/2014  . Degenerative arthritis of hip 03/07/2012    Past Surgical History:  Procedure Laterality Date  . BREAST BIOPSY Left    pt not sure of date.  Marland Kitchen HIP SURGERY    . SHOULDER SURGERY       OB History    Gravida  1   Para      Term      Preterm      AB      Living        SAB      TAB      Ectopic      Multiple      Live Births              No family history on file.  Social History   Tobacco Use  . Smoking status: Former Research scientist (life sciences)  . Smokeless tobacco: Never Used  Substance Use Topics  . Alcohol use: Yes    Alcohol/week: 7.0 standard drinks    Types: 7 Standard drinks or equivalent per week  . Drug use: No    Home  Medications Prior to Admission medications   Medication Sig Start Date End Date Taking? Authorizing Provider  amoxicillin (AMOXIL) 500 MG capsule Take 500 mg by mouth 3 (three) times daily. 04/24/20  Yes [provider]  amphetamine-dextroamphetamine (ADDERALL) 20 MG tablet Take 20 mg by mouth See admin instructions. Take one tablet (20 mg) by mouth twice daily - breakfast and lunch 10/27/15  Yes [provider]  DULoxetine (CYMBALTA) 60 MG capsule Take 60 mg by mouth daily. 06/17/20  Yes [provider]  estradiol (ESTRACE) 1 MG tablet Take 1 mg by mouth daily.  08/13/14  Yes [provider]  gabapentin (NEURONTIN) 600 MG tablet Take 1,200 mg by mouth 3 (three) times daily.  08/31/18  Yes [provider]  levothyroxine (SYNTHROID, LEVOTHROID) 75 MCG tablet Take 1 tablet (75 mcg total) by mouth daily before breakfast. **PT NEEDS FOLLOW UP APPT** Patient taking differently: Take 75 mcg by mouth daily before breakfast.  07/24/15  Yes Philemon Kingdom, MD  LIDOCAINE EX Apply 1 application topically 2 (two) times daily as needed (shoulder  pain).   Yes [provider]  meloxicam (MOBIC) 7.5 MG tablet Take 7.5 mg by mouth daily. 06/02/20  Yes [provider]  methocarbamol (ROBAXIN) 750 MG tablet Take 1,500 mg by mouth 2 (two) times daily as needed (pain).  06/23/20  Yes [provider]  Multiple Vitamin (MULTIVITAMIN WITH MINERALS) TABS tablet Take 1 tablet by mouth daily.   Yes [provider]  NIFEdipine (PROCARDIA-XL/ADALAT-CC/NIFEDICAL-XL) 30 MG 24 hr tablet Take 30 mg by mouth daily.  11/04/15  Yes [provider]  polyvinyl alcohol (ARTIFICIAL TEARS) 1.4 % ophthalmic solution Place 1 drop into both eyes daily.   Yes [provider]  traMADol (ULTRAM) 50 MG tablet Take 50 mg by mouth 2 (two) times daily as needed (pain). for pain 01/05/19  Yes [provider]    Allergies    Patient has no known  allergies.  Review of Systems   Review of Systems  Constitutional: Positive for activity change.  Respiratory: Negative for shortness of breath.   Cardiovascular: Negative for chest pain.  Musculoskeletal: Negative for arthralgias.  Neurological: Negative for numbness.  Hematological: Does not bruise/bleed easily.    Physical Exam Updated Vital Signs BP 111/69   Pulse 71   Temp 98 F (36.7 C) (Oral)   Resp (!) 22   Ht 5\' 4"  (1.626 m)   Wt 62.6 kg   SpO2 100%   BMI 23.69 kg/m   Physical Exam Vitals and nursing note reviewed.  Constitutional:      Appearance: She is well-developed.  HENT:     Head: Normocephalic and atraumatic.  Cardiovascular:     Rate and Rhythm: Normal rate.  Pulmonary:     Effort: Pulmonary effort is normal.  Abdominal:     General: Bowel sounds are normal.  Musculoskeletal:     Cervical back: Normal range of motion and neck supple.     Comments: Shortened left leg with mild deformity over the left hip  Skin:    General: Skin is warm and dry.  Neurological:     Mental Status: She is alert and oriented to person, place, and time.     Sensory: No sensory deficit.     ED Results / Procedures / Treatments   Labs (all labs ordered are listed, but only abnormal results are displayed) Labs Reviewed - No data to display  EKG None  Radiology DG Hip [place] W or [place] Pelvis 1 View Left  Result Date: 06/26/2020 CLINICAL DATA:  Status post reduction of left hip EXAM: DG HIP (WITH OR WITHOUT PELVIS) 1V PORT LEFT COMPARISON:  Earlier today FINDINGS: There is been interval reduction of the left femoral prosthesis. Again seen are extensive erosive changes around the femoral prosthesis as well as the left pubic ramus. No acute fractures identified. The right hip prosthesis is normally located. IMPRESSION: Interval reduction of left femoral prosthesis. Electronically Signed   By: Kerby Moors M.D.   On: 06/26/2020 12:59   DG Hip Unilat With Pelvis  2-3 Views Left  Result Date: 06/26/2020 CLINICAL DATA:  Left hip dislocations. EXAM: DG HIP (WITH OR WITHOUT PELVIS) 2-3V LEFT COMPARISON:  06/09/2020 FINDINGS: The left femoral prosthesis is dislocated superiorly and posteriorly. Stable extensive erosive changes around the femoral prosthesis and also involving the left pubic ramus. No acute fracture. The right hip prosthesis is normally located. IMPRESSION: Superior and posterior dislocation of the left femoral prosthesis. Electronically Signed   By: Marijo Sanes M.D.   On: 06/26/2020  11:43    Procedures Reduction of dislocation  Date/Time: 06/26/2020 12:50 PM Performed by: Varney Biles, MD Authorized by: Varney Biles, MD  Consent: Verbal consent obtained. Written consent obtained. Risks and benefits: risks, benefits and alternatives were discussed Consent given by: patient Patient understanding: patient states understanding of the procedure being performed Patient consent: the patient's understanding of the procedure matches consent given Procedure consent: procedure consent matches procedure scheduled Relevant documents: relevant documents present and verified Test results: test results available and properly labeled Site marked: the operative site was marked Imaging studies: imaging studies available Patient identity confirmed: arm band Time out: Immediately prior to procedure a "time out" was called to verify the correct patient, procedure, equipment, support staff and site/side marked as required.  Sedation: Patient sedated: yes  Patient tolerance: patient tolerated the procedure well with no immediate complications  .Sedation  Date/Time: 06/26/2020 12:40 PM Performed by: Varney Biles, MD Authorized by: Varney Biles, MD   Consent:    Consent obtained:  Written and verbal   Consent given by:  Patient   Risks discussed:  Allergic reaction, prolonged hypoxia resulting in organ damage, prolonged sedation  necessitating reversal, respiratory compromise necessitating ventilatory assistance and intubation, vomiting, inadequate sedation, dysrhythmia and nausea Universal protocol:    Procedure explained and questions answered to patient or proxy's satisfaction: yes     Relevant documents present and verified: yes     Test results available and properly labeled: yes     Imaging studies available: yes     Required blood products, implants, devices, and special equipment available: yes     Site/side marked: yes     Immediately prior to procedure a time out was called: yes     Patient identity confirmation method:  Arm band Indications:    Procedure performed:  Dislocation reduction   Procedure necessitating sedation performed by:  Physician performing sedation Pre-sedation assessment:    Time since last food or drink:  5 hours   ASA classification: class 3 - patient with severe systemic disease     Neck mobility: normal     Mouth opening:  2 finger widths   Mallampati score:  II - soft palate, uvula, fauces visible   Pre-sedation assessments completed and reviewed: airway patency, cardiovascular function, hydration status, mental status, nausea/vomiting and pain level     Pre-sedation assessment completed:  06/26/2020 12:30 PM Immediate pre-procedure details:    Reassessment: Patient reassessed immediately prior to procedure     Reviewed: vital signs     Verified: bag valve mask available, emergency equipment available, intubation equipment available, IV patency confirmed and oxygen available   Procedure details (see MAR for exact dosages):    Preoxygenation:  Room air   Sedation:  Propofol   Intended level of sedation: deep   Intra-procedure monitoring:  Blood pressure monitoring, continuous capnometry, continuous pulse oximetry and cardiac monitor   Intra-procedure events: none     Total Provider sedation time (minutes):  25 Post-procedure details:    Post-sedation assessment completed:   06/26/2020 1:09 PM   Attendance: Constant attendance by certified staff until patient recovered     Recovery: Patient returned to pre-procedure baseline     Post-sedation assessments completed and reviewed: airway patency, cardiovascular function, mental status, nausea/vomiting and pain level     Patient is stable for discharge or admission: yes     Patient tolerance:  Tolerated well, no immediate complications   (including critical care time)  Medications Ordered in ED Medications  propofol (DIPRIVAN) 10 mg/mL bolus/IV push 40 mg (115 mg Intravenous Given 06/26/20 1233)  propofol (DIPRIVAN) 10 mg/mL bolus/IV push (75 mg Intravenous Given 06/26/20 1223)    ED Course  I have reviewed the triage vital signs and the nursing notes.  Pertinent labs & imaging results that were available during my care of the patient were reviewed by me and considered in my medical decision making (see chart for details).    MDM Rules/Calculators/A&P                          Patient with a history of recurrent left hip dislocation comes in a chief complaint of acute left hip dislocation.  She is neurovascularly intact.  Her hip was reduced without significant complication. Patient was reassessed at 1:09 PM and she has returned back to baseline from alertness perspective.  Will monitor for few more minutes before she is discharged.  Final Clinical Impression(s) / ED Diagnoses Final diagnoses:  Dislocation of left hip, initial encounter [facility])    Rx / DC Orders ED Discharge Orders    None       Varney Biles, MD 06/26/20 1310

## 2020-06-28 ENCOUNTER — Emergency Department (HOSPITAL_COMMUNITY): Payer: Medicare Other

## 2020-06-28 ENCOUNTER — Emergency Department (HOSPITAL_COMMUNITY)
Admission: EM | Admit: 2020-06-28 | Discharge: 2020-06-28 | Disposition: A | Discharge status: Home / Self Care | Payer: Medicare Other | Attending: Emergency Medicine | Admitting: Emergency Medicine

## 2020-06-28 ENCOUNTER — Encounter (HOSPITAL_COMMUNITY): Payer: Self-pay | Admitting: Emergency Medicine

## 2020-06-28 ENCOUNTER — Other Ambulatory Visit: Payer: Self-pay

## 2020-06-28 DIAGNOSIS — M069 Rheumatoid arthritis, unspecified: Secondary | ICD-10-CM | POA: Insufficient documentation

## 2020-06-28 DIAGNOSIS — E039 Hypothyroidism, unspecified: Secondary | ICD-10-CM | POA: Diagnosis not present

## 2020-06-28 DIAGNOSIS — X509XXA Other and unspecified overexertion or strenuous movements or postures, initial encounter: Secondary | ICD-10-CM | POA: Insufficient documentation

## 2020-06-28 DIAGNOSIS — Z79899 Other long term (current) drug therapy: Secondary | ICD-10-CM | POA: Insufficient documentation

## 2020-06-28 DIAGNOSIS — T84028A Dislocation of other internal joint prosthesis, initial encounter: Secondary | ICD-10-CM | POA: Diagnosis not present

## 2020-06-28 DIAGNOSIS — S73005A Unspecified dislocation of left hip, initial encounter: Secondary | ICD-10-CM | POA: Diagnosis not present

## 2020-06-28 DIAGNOSIS — S79912A Unspecified injury of left hip, initial encounter: Secondary | ICD-10-CM | POA: Diagnosis present

## 2020-06-28 DIAGNOSIS — M25552 Pain in left hip: Secondary | ICD-10-CM

## 2020-06-28 DIAGNOSIS — T84021A Dislocation of internal left hip prosthesis, initial encounter: Secondary | ICD-10-CM | POA: Diagnosis not present

## 2020-06-28 MED ORDER — KETAMINE HCL 50 MG/5ML IJ SOSY
PREFILLED_SYRINGE | INTRAMUSCULAR | Status: AC
  Filled 2020-06-28: qty 5

## 2020-06-28 MED ORDER — PROPOFOL 10 MG/ML IV BOLUS
INTRAVENOUS | Status: AC | PRN
  Administered 2020-06-28: 50 ug via INTRAVENOUS

## 2020-06-28 MED ORDER — KETAMINE HCL 10 MG/ML IJ SOLN
INTRAMUSCULAR | Status: AC | PRN
  Administered 2020-06-28: 12.5 mg via INTRAVENOUS

## 2020-06-28 MED ORDER — PROPOFOL 10 MG/ML IV BOLUS
0.5000 mg/kg | Freq: Once | INTRAVENOUS | Status: DC
  Filled 2020-06-28: qty 20

## 2020-06-28 MED ORDER — KETAMINE HCL 10 MG/ML IJ SOLN
INTRAMUSCULAR | Status: AC | PRN
  Administered 2020-06-28 (×5): 12.5 mg via INTRAVENOUS

## 2020-06-28 MED ORDER — PROPOFOL 10 MG/ML IV BOLUS
INTRAVENOUS | Status: AC
  Filled 2020-06-28: qty 20

## 2020-06-28 MED ORDER — KETAMINE HCL 50 MG/5ML IJ SOSY
0.5000 mg/kg | PREFILLED_SYRINGE | Freq: Once | INTRAMUSCULAR | Status: DC
  Filled 2020-06-28: qty 5

## 2020-06-28 MED ORDER — PROPOFOL 10 MG/ML IV BOLUS: INTRAVENOUS | Status: AC | PRN

## 2020-06-28 MED ORDER — PROPOFOL 10 MG/ML IV BOLUS
INTRAVENOUS | Status: AC | PRN
  Administered 2020-06-28 (×5): 12.5 mg via INTRAVENOUS

## 2020-06-28 MED ORDER — PROPOFOL 10 MG/ML IV BOLUS
INTRAVENOUS | Status: AC | PRN
  Administered 2020-06-28: 12.5 mg via INTRAVENOUS

## 2020-06-28 MED ORDER — PROPOFOL 1000 MG/100ML IV EMUL: 5.0000 ug/kg/min | INTRAVENOUS | Status: DC

## 2020-06-28 MED ORDER — OXYCODONE HCL 5 MG PO CAPS: 5.0000 mg | ORAL_CAPSULE | Freq: Four times a day (QID) | ORAL | 0 refills | Status: AC | PRN

## 2020-06-28 MED ORDER — KETAMINE HCL 10 MG/ML IJ SOLN
INTRAMUSCULAR | Status: AC | PRN
  Administered 2020-06-28: 50 mg via INTRAVENOUS

## 2020-06-28 NOTE — ED Notes (Signed)
Husband is at bedside & EDP Alroy Bailiff, [place], [place]) has updated him.

## 2020-06-28 NOTE — Sedation Documentation (Signed)
Pt is screaming and yelling at staff. Pt states she is hallucinating. Pt reports she never wants ketamine again. Pt states she wants to go home.

## 2020-06-28 NOTE — ED Provider Notes (Signed)
  Face-to-face evaluation   History: Patient injured her left hip, while lying in bed wearing her brace, yesterday.  She presents for concerns of dislocation of her hip which is recurrent  Physical exam: Elderly female who is alert and cooperative.  She does not appear uncomfortable.  She is doing a puzzle on her phone as I entered the room.  Heart regular rate and rhythm.  Lungs clear anteriorly.  Oropharynx is moist.  Dentition appears normal.  Good view of posterior pharynx, with request.   .Sedation  Date/Time: 06/28/2020 12:21 PM Performed by: Daleen Bo, MD Authorized by: Daleen Bo, MD   Consent:    Consent obtained:  Written   Consent given by:  Patient   Alternatives discussed:  Analgesia without sedation Universal protocol:    Procedure explained and questions answered to patient or proxy's satisfaction: yes     Imaging studies available: yes     Site/side marked: no     Immediately prior to procedure a time out was called: yes     Patient identity confirmation method:  Verbally with patient Indications:    Procedure performed:  Dislocation reduction   Procedure necessitating sedation performed by:  Physician performing sedation Pre-sedation assessment:    Time since last food or drink:  4 hours   ASA classification: class 2 - patient with mild systemic disease     Mallampati score:  II - soft palate, uvula, fauces visible   Pre-sedation assessments completed and reviewed: airway patency, cardiovascular function, hydration status and mental status     Pre-sedation assessments completed and reviewed: pre-procedure nausea and vomiting status not reviewed and pre-procedure pain level not reviewed     Pre-sedation assessment completed:  06/28/2020 11:50 AM Immediate pre-procedure details:    Reassessment: Patient reassessed immediately prior to procedure     Reviewed: vital signs     Verified: bag valve mask available, emergency equipment available, intubation  equipment available, IV patency confirmed and oxygen available   Procedure details (see MAR for exact dosages):    Preoxygenation:  Nasal cannula   Sedation:  Propofol and ketamine   Intended level of sedation: deep   Intra-procedure monitoring:  Blood pressure monitoring, cardiac monitor, continuous capnometry and continuous pulse oximetry   Intra-procedure events: none     Total Provider sedation time (minutes):  30 Post-procedure details:    Post-sedation assessment completed:  06/28/2020 12:23 PM   Attendance: Constant attendance by certified staff until patient recovered     Post-sedation assessments completed and reviewed: airway patency, cardiovascular function and hydration status     Post-sedation assessments completed and reviewed: mental status not reviewed (Hallucinogenic reaction to ketamine), nausea/vomiting not reviewed and pain level not reviewed     Patient is stable for discharge or admission: no   Comments:     Post reduction imaging shows persistent dislocation  12:20 PM-patient will need to have another procedure to reduce the hip, by a different physician.   Medical screening examination/treatment/procedure(s) were conducted as a shared visit with non-physician practitioner(s) and myself.  I personally evaluated the patient during the encounter    Daleen Bo, MD 06/29/20 1001

## 2020-06-28 NOTE — Sedation Documentation (Signed)
20 mg of Propofol given via right forearm IV

## 2020-06-28 NOTE — ED Provider Notes (Signed)
[facility]PART[facility] Provider Note   CSN: [id number] Arrival date & time: 06/28/20  0847     History Chief Complaint  Patient presents with   Hip Pain    Virginia Gordon is a 69 y.o. female with PMHx RA, connective tissue disease, and hx of L total hip replacement who presents to the ED today with complaint of dislocated hip that occurred yesterday morning. Pt states that she was spending time with her grandchildren when she rolled over in bed and felt her hip pop out of place. Pt states that is has been out since then. She did not want to come to the ED yesterday as her children were leaving today after the holidays and she wanted to spend more time with them. She reports this is her 5th visit in the past month for dislocation of her left hip. She has not followed up with her surgeon regarding this recently - seen in the ED 2 days ago for same requiring sedation. Pt has no other complaints at this time.   The history is provided by the patient and medical records.       Past Medical History:  Diagnosis Date   Rheumatoid arthritis [facility])     Patient Active Problem List   Diagnosis Date Noted   ADD (attention deficit disorder) 11/13/2015   Connective tissue disease overlap syndrome ([place]) 11/13/2015   Menopausal and perimenopausal disorder 11/13/2015   Acne erythematosa 11/13/2015   Disordered sleep 11/13/2015   History of operative procedure on hip 07/03/2015   Left knee pain 05/20/2014   Right foot pain 05/14/2014   Bilateral shoulder pain 05/14/2014   Hypothyroidism 03/08/2014   Degenerative arthritis of hip 03/07/2012    Past Surgical History:  Procedure Laterality Date   BREAST BIOPSY Left    pt not sure of date.   HIP SURGERY     SHOULDER SURGERY       OB History    Gravida  1   Para      Term      Preterm      AB      Living        SAB      TAB      Ectopic      Multiple      Live Births               No family history on file.  Social History   Tobacco Use   Smoking status: Former Smoker   Smokeless tobacco: Never Used  Substance Use Topics   Alcohol use: Yes    Alcohol/week: 7.0 standard drinks    Types: 7 Standard drinks or equivalent per week   Drug use: No    Home Medications Prior to Admission medications   Medication Sig Start Date End Date Taking? Authorizing Provider  amoxicillin (AMOXIL) 500 MG capsule Take 500 mg by mouth 3 (three) times daily. 04/24/20   [provider]  amphetamine-dextroamphetamine (ADDERALL) 20 MG tablet Take 20 mg by mouth See admin instructions. Take one tablet (20 mg) by mouth twice daily - breakfast and lunch 10/27/15   [provider]  DULoxetine (CYMBALTA) 60 MG capsule Take 60 mg by mouth daily. 06/17/20   [provider]  estradiol (ESTRACE) 1 MG tablet Take 1 mg by mouth daily.  08/13/14   [provider]  gabapentin (NEURONTIN) 600 MG tablet Take 1,200 mg by mouth 3 (three) times daily.  08/31/18  [provider]  levothyroxine (SYNTHROID, LEVOTHROID) 75 MCG tablet Take 1 tablet (75 mcg total) by mouth daily before breakfast. **PT NEEDS FOLLOW UP APPT** Patient taking differently: Take 75 mcg by mouth daily before breakfast.  07/24/15   Philemon Kingdom, MD  LIDOCAINE EX Apply 1 application topically 2 (two) times daily as needed (shoulder pain).    [provider]  meloxicam (MOBIC) 7.5 MG tablet Take 7.5 mg by mouth daily. 06/02/20   [provider]  methocarbamol (ROBAXIN) 750 MG tablet Take 1,500 mg by mouth 2 (two) times daily as needed (pain).  06/23/20   [provider]  Multiple Vitamin (MULTIVITAMIN WITH MINERALS) TABS tablet Take 1 tablet by mouth daily.    [provider]  NIFEdipine (PROCARDIA-XL/ADALAT-CC/NIFEDICAL-XL) 30 MG 24 hr tablet Take 30 mg by mouth daily.  11/04/15   [provider]  oxycodone (OXY-IR) 5 MG capsule Take 1  capsule (5 mg total) by mouth every 6 (six) hours as needed. 06/28/20   Alroy Bailiff, Syleena Mchan, PA-C  polyvinyl alcohol (ARTIFICIAL TEARS) 1.4 % ophthalmic solution Place 1 drop into both eyes daily.    [provider]  traMADol (ULTRAM) 50 MG tablet Take 50 mg by mouth 2 (two) times daily as needed (pain). for pain 01/05/19   [provider]    Allergies    Ketamine  Review of Systems   Review of Systems  Constitutional: Negative for chills and fever.  Musculoskeletal: Positive for arthralgias.  Neurological: Negative for weakness and numbness.  All other systems reviewed and are negative.   Physical Exam Updated Vital Signs BP 131/60 (BP Location: Left Arm)    Pulse 74    Temp 97.6 F (36.4 C) (Oral)    Resp 18    SpO2 100%   Physical Exam Vitals and nursing note reviewed.  Constitutional:      Appearance: She is not ill-appearing or diaphoretic.  HENT:     Head: Normocephalic and atraumatic.  Eyes:     Conjunctiva/sclera: Conjunctivae normal.  Cardiovascular:     Rate and Rhythm: Normal rate and regular rhythm.     Pulses: Normal pulses.  Pulmonary:     Effort: Pulmonary effort is normal.     Breath sounds: Normal breath sounds. No wheezing, rhonchi or rales.  Abdominal:     Palpations: Abdomen is soft.     Tenderness: There is no abdominal tenderness. There is no guarding or rebound.  Musculoskeletal:     Cervical back: Neck supple.     Comments: Left leg shortened and externally rotated with deformity noted to the posteriolateral aspect consistent with likely dislocation. No obvious TTP. 2+ DP pulse. Remainder of leg warm with good perfusion.   Skin:    General: Skin is warm and dry.  Neurological:     Mental Status: She is alert.     ED Results / Procedures / Treatments   Labs (all labs ordered are listed, but only abnormal results are displayed) Labs Reviewed - No data to display  EKG None  Radiology DG Hip [place] W or [place] Pelvis 1 View  Left  Result Date: 06/28/2020 CLINICAL DATA:  Chronic left hip dislocation, post reduction. EXAM: DG HIP (WITH OR WITHOUT PELVIS) 1V PORT LEFT COMPARISON:  06/28/2020 at 9:15 a.m. FINDINGS: The femoral component of the left hip prosthesis continues to be dislocated laterally and superiorly with respect to the acetabular component. Continued bony lucency favoring erosion of the left inferior acetabulum below the shell component.  Continued protrusio of the shell component. IMPRESSION: 1. Continued lateral and superior dislocation of the femoral component of the left hip prosthesis. Electronically Signed   By: Van Clines M.D.   On: 06/28/2020 12:32   DG Hip Unilat With Pelvis 2-3 Views Left  Result Date: 06/28/2020 CLINICAL DATA:  Hip dislocation. EXAM: DG HIP (WITH OR WITHOUT PELVIS) 2-3V LEFT COMPARISON:  06/26/2020 FINDINGS: The left femoral prosthesis is dislocated superiorly and posteriorly. Extensive erosive changes around the femoral prosthesis and also involving the left pubic rami as noted previously. No acute fracture identified. Right hip prosthesis is normally located. Signs of chronic insufficiency fracture within the right sacral wing are similar to previous exams. IMPRESSION: Superior and posterior dislocation of the left femoral prosthesis. Electronically Signed   By: Kerby Moors M.D.   On: 06/28/2020 09:43    Procedures .Sedation  Date/Time: 06/28/2020 2:49 PM Performed by: Pattricia Boss, MD Authorized by: Pattricia Boss, MD   Consent:    Consent obtained:  Verbal   Consent given by:  Patient and spouse Universal protocol:    Immediately prior to procedure a time out was called: yes   Indications:    Procedure performed:  Dislocation reduction   Procedure necessitating sedation performed by:  Physician performing sedation Pre-sedation assessment:    Time since last food or drink:  4 hours   ASA classification: class 2 - patient with mild systemic disease      Mallampati score:  II - soft palate, uvula, fauces visible   Pre-sedation assessments completed and reviewed: airway patency, mental status and respiratory function   Immediate pre-procedure details:    Reassessment: Patient reassessed immediately prior to procedure     Reviewed: vital signs     Verified: bag valve mask available, IV patency confirmed, oxygen available and suction available   Procedure details (see MAR for exact dosages):    Preoxygenation:  Nasal cannula   Sedation:  Propofol   Intended level of sedation: deep   Intra-procedure monitoring:  Blood pressure monitoring, continuous capnometry, cardiac monitor and continuous pulse oximetry   Intra-procedure events: none     Total Provider sedation time (minutes):  25 Post-procedure details:    Attendance: Constant attendance by certified staff until patient recovered     Recovery: Patient returned to pre-procedure baseline     Patient tolerance:  Tolerated well, no immediate complications Comments:     Unable to successfully reduce hip a second time   (including critical care time)  Medications Ordered in ED Medications  propofol (DIPRIVAN) 10 mg/mL bolus/IV push 31.3 mg (has no administration in time range)  ketamine 50 mg in normal saline 5 mL (10 mg/mL) syringe (has no administration in time range)  ketamine HCl 50 MG/5ML SOSY (has no administration in time range)  propofol (DIPRIVAN) 10 mg/mL bolus/IV push (has no administration in time range)  propofol (DIPRIVAN) 10 mg/mL bolus/IV push (50 mcg Intravenous Given 06/28/20 1154)  ketamine (KETALAR) injection (50 mg Intravenous Given 06/28/20 1155)  propofol (DIPRIVAN) 10 mg/mL bolus/IV push ( Intravenous Canceled Entry 06/28/20 1232)  ketamine (KETALAR) injection (12.5 mg Intravenous Given 06/28/20 1201)  ketamine (KETALAR) injection (12.5 mg Intravenous Given 06/28/20 1203)  propofol (DIPRIVAN) 10 mg/mL bolus/IV push (12.5 mg Intravenous Given 06/28/20 1203)    propofol (DIPRIVAN) 10 mg/mL bolus/IV push (20 mg Intravenous Canceled Entry 06/28/20 1348)    ED Course  I have reviewed the triage vital signs and the nursing notes.  Pertinent labs & imaging results  that were available during my care of the patient were reviewed by me and considered in my medical decision making (see chart for details).    MDM Rules/Calculators/A&P                          69 year old female with a history of left hip replacement with recurrent dislocations in the past month presents to the ED today with complaint of dislocated left hip that popped out of place at 10 AM yesterday morning.  She decided not to come to the ED until today as her children were leaving for the holidays and she wanted more time with them.  This is her fifth visit in the past month for dislocation of the same hip, all 4 times in the past she is needed sedation.  On arrival to the ED patient is afebrile, nontachycardic and nontachypneic and appears to be in no acute distress.  On exam she has obvious leg shortening and rotation externally of the left leg.  She is neurovascularly intact throughout.  We will plan for x-ray and plan for sedation.  Attending physician Dr. Jeanell Sparrow made aware.   Care signed over to Dr. Eulis Foster when he came in for his shift given Dr. Jeanell Sparrow was busy with a level I trauma.   Conscious sedation performed by Dr. Eulis Foster with propofol and ketamine (see procedure note). Unfortunately the reduction was not successful despite 30 minutes of attempt. Pt was monitored and when she awoke it appeared she was having a reaction to the ketamine of  hallucinations. Added to allergy list. Pt given option of another ED physician performing the reduction for a second attempt which she was comfortable with. Attempted again  By Dr. Jeanell Sparrow with Propofol and again unsuccessful reduction. Virginia Gordon would like [place] to speak with [facility] at this time as this is where patient had her hip replaced.   Spoke with the transfer  center at [facility] who was able to speak with the orthopedist on call. They would like our orthopedist to consult and if they do not feel comfortable attempting reduction then they can formally consult.   2:52 PM Have updated family and patient on plan to speak with our orthopedist on call which they decline. Pt has decided to leave [place] despite lengthy discussion regarding risks. She is planning to go to [place] on Monday for a hip reduction.  I have provided 6 tablets of additional 5 mg oxycodone to get patient through this weekend.  This note was prepared using Dragon voice recognition software and may include unintentional dictation errors due to the inherent limitations of voice recognition software.  Final Clinical Impression(s) / ED Diagnoses Final diagnoses:  Left hip pain  Dislocation of left hip, initial encounter [facility])    Rx / DC Orders ED Discharge Orders         Ordered    oxycodone (OXY-IR) 5 MG capsule  Every 6 hours PRN        06/28/20 1448           Eustaquio Maize, PA-C 06/28/20 1457    Daleen Bo, MD 06/29/20 1001

## 2020-06-28 NOTE — Sedation Documentation (Signed)
Pt demanded bp cuff be removed

## 2020-06-28 NOTE — Progress Notes (Signed)
Orthopedic Tech Progress Note Patient Details:  Virginia Gordon 1950/09/20 [id number] Hip Reduction Patient ID: Virginia Gordon, female   DOB: 04-23-1951, 69 y.o.   MRN: [id number]   Virginia Gordon 06/28/2020, 12:29 PM

## 2020-06-28 NOTE — ED Triage Notes (Addendum)
Pt states she is here for chronic L hip dislocation. States it is the 5th occurrence.  Denies pain.  States it happened while lying in bed yesterday.

## 2020-06-28 NOTE — ED Notes (Signed)
For the second time this visit pt has took herself to the bathroom while utilizing her wheel chair to self transfer. EDP has been notified both occasions she took herself to the restroom & staff did standby for her safety when she did this at her demand.

## 2020-06-28 NOTE — Sedation Documentation (Signed)
60 mg of propofol given via right forearm.

## 2020-06-30 DIAGNOSIS — D509 Iron deficiency anemia, unspecified: Secondary | ICD-10-CM | POA: Diagnosis not present

## 2020-06-30 DIAGNOSIS — I10 Essential (primary) hypertension: Secondary | ICD-10-CM | POA: Diagnosis not present

## 2020-06-30 DIAGNOSIS — M069 Rheumatoid arthritis, unspecified: Secondary | ICD-10-CM | POA: Diagnosis not present

## 2020-06-30 DIAGNOSIS — G8929 Other chronic pain: Secondary | ICD-10-CM | POA: Diagnosis not present

## 2020-06-30 DIAGNOSIS — F329 Major depressive disorder, single episode, unspecified: Secondary | ICD-10-CM | POA: Diagnosis not present

## 2020-06-30 DIAGNOSIS — E039 Hypothyroidism, unspecified: Secondary | ICD-10-CM | POA: Diagnosis not present

## 2020-07-03 DIAGNOSIS — Z79890 Hormone replacement therapy (postmenopausal): Secondary | ICD-10-CM | POA: Diagnosis not present

## 2020-07-03 DIAGNOSIS — M24452 Recurrent dislocation, left hip: Secondary | ICD-10-CM | POA: Diagnosis not present

## 2020-07-03 DIAGNOSIS — Z79899 Other long term (current) drug therapy: Secondary | ICD-10-CM | POA: Diagnosis not present

## 2020-07-03 DIAGNOSIS — I10 Essential (primary) hypertension: Secondary | ICD-10-CM | POA: Diagnosis present

## 2020-07-03 DIAGNOSIS — Z01818 Encounter for other preprocedural examination: Secondary | ICD-10-CM | POA: Diagnosis not present

## 2020-07-03 DIAGNOSIS — Z471 Aftercare following joint replacement surgery: Secondary | ICD-10-CM | POA: Diagnosis not present

## 2020-07-03 DIAGNOSIS — M47816 Spondylosis without myelopathy or radiculopathy, lumbar region: Secondary | ICD-10-CM | POA: Diagnosis not present

## 2020-07-03 DIAGNOSIS — M069 Rheumatoid arthritis, unspecified: Secondary | ICD-10-CM | POA: Diagnosis present

## 2020-07-03 DIAGNOSIS — M1990 Unspecified osteoarthritis, unspecified site: Secondary | ICD-10-CM | POA: Diagnosis present

## 2020-07-03 DIAGNOSIS — M247 Protrusio acetabuli: Secondary | ICD-10-CM | POA: Diagnosis not present

## 2020-07-03 DIAGNOSIS — K219 Gastro-esophageal reflux disease without esophagitis: Secondary | ICD-10-CM | POA: Diagnosis present

## 2020-07-03 DIAGNOSIS — Z87891 Personal history of nicotine dependence: Secondary | ICD-10-CM | POA: Diagnosis not present

## 2020-07-03 DIAGNOSIS — E039 Hypothyroidism, unspecified: Secondary | ICD-10-CM | POA: Diagnosis not present

## 2020-07-03 DIAGNOSIS — G629 Polyneuropathy, unspecified: Secondary | ICD-10-CM | POA: Diagnosis present

## 2020-07-03 DIAGNOSIS — R339 Retention of urine, unspecified: Secondary | ICD-10-CM | POA: Diagnosis not present

## 2020-07-03 DIAGNOSIS — Z96642 Presence of left artificial hip joint: Secondary | ICD-10-CM | POA: Diagnosis not present

## 2020-07-03 DIAGNOSIS — G8929 Other chronic pain: Secondary | ICD-10-CM | POA: Diagnosis present

## 2020-07-03 DIAGNOSIS — Q675 Congenital deformity of spine: Secondary | ICD-10-CM | POA: Diagnosis not present

## 2020-07-03 DIAGNOSIS — F909 Attention-deficit hyperactivity disorder, unspecified type: Secondary | ICD-10-CM | POA: Diagnosis present

## 2020-07-03 DIAGNOSIS — Z96643 Presence of artificial hip joint, bilateral: Secondary | ICD-10-CM | POA: Diagnosis not present

## 2020-07-03 DIAGNOSIS — M351 Other overlap syndromes: Secondary | ICD-10-CM | POA: Diagnosis present

## 2020-07-03 DIAGNOSIS — M65852 Other synovitis and tenosynovitis, left thigh: Secondary | ICD-10-CM | POA: Diagnosis not present

## 2020-07-03 DIAGNOSIS — Z811 Family history of alcohol abuse and dependence: Secondary | ICD-10-CM | POA: Diagnosis not present

## 2020-07-03 DIAGNOSIS — N951 Menopausal and female climacteric states: Secondary | ICD-10-CM | POA: Diagnosis present

## 2020-07-03 DIAGNOSIS — Z8249 Family history of ischemic heart disease and other diseases of the circulatory system: Secondary | ICD-10-CM | POA: Diagnosis not present

## 2020-07-03 DIAGNOSIS — D649 Anemia, unspecified: Secondary | ICD-10-CM | POA: Diagnosis present

## 2020-07-03 DIAGNOSIS — Z20822 Contact with and (suspected) exposure to covid-19: Secondary | ICD-10-CM | POA: Diagnosis present

## 2020-07-03 DIAGNOSIS — T84021A Dislocation of internal left hip prosthesis, initial encounter: Secondary | ICD-10-CM | POA: Diagnosis not present

## 2020-07-03 DIAGNOSIS — G8918 Other acute postprocedural pain: Secondary | ICD-10-CM | POA: Diagnosis not present

## 2020-07-03 DIAGNOSIS — Z823 Family history of stroke: Secondary | ICD-10-CM | POA: Diagnosis not present

## 2020-07-03 DIAGNOSIS — Z83438 Family history of other disorder of lipoprotein metabolism and other lipidemia: Secondary | ICD-10-CM | POA: Diagnosis not present

## 2020-07-03 DIAGNOSIS — Z791 Long term (current) use of non-steroidal anti-inflammatories (NSAID): Secondary | ICD-10-CM | POA: Diagnosis not present

## 2020-07-18 DIAGNOSIS — E871 Hypo-osmolality and hyponatremia: Secondary | ICD-10-CM | POA: Diagnosis not present

## 2020-07-18 DIAGNOSIS — R1013 Epigastric pain: Secondary | ICD-10-CM | POA: Diagnosis not present

## 2020-07-18 DIAGNOSIS — F3342 Major depressive disorder, recurrent, in full remission: Secondary | ICD-10-CM | POA: Diagnosis not present

## 2020-07-18 DIAGNOSIS — F900 Attention-deficit hyperactivity disorder, predominantly inattentive type: Secondary | ICD-10-CM | POA: Diagnosis not present

## 2020-07-18 DIAGNOSIS — Z79890 Hormone replacement therapy (postmenopausal): Secondary | ICD-10-CM | POA: Diagnosis not present

## 2020-07-18 DIAGNOSIS — R197 Diarrhea, unspecified: Secondary | ICD-10-CM | POA: Diagnosis not present

## 2020-07-18 DIAGNOSIS — I10 Essential (primary) hypertension: Secondary | ICD-10-CM | POA: Diagnosis not present

## 2020-07-18 DIAGNOSIS — T84021S Dislocation of internal left hip prosthesis, sequela: Secondary | ICD-10-CM | POA: Diagnosis not present

## 2020-07-18 DIAGNOSIS — E039 Hypothyroidism, unspecified: Secondary | ICD-10-CM | POA: Diagnosis not present

## 2020-07-18 DIAGNOSIS — D509 Iron deficiency anemia, unspecified: Secondary | ICD-10-CM | POA: Diagnosis not present

## 2020-07-18 DIAGNOSIS — M069 Rheumatoid arthritis, unspecified: Secondary | ICD-10-CM | POA: Diagnosis not present

## 2020-07-18 DIAGNOSIS — M85852 Other specified disorders of bone density and structure, left thigh: Secondary | ICD-10-CM | POA: Diagnosis not present

## 2020-07-21 DIAGNOSIS — R197 Diarrhea, unspecified: Secondary | ICD-10-CM | POA: Diagnosis not present

## 2020-07-22 DIAGNOSIS — M25352 Other instability, left hip: Secondary | ICD-10-CM | POA: Diagnosis not present

## 2020-07-22 DIAGNOSIS — Z96643 Presence of artificial hip joint, bilateral: Secondary | ICD-10-CM | POA: Diagnosis not present

## 2020-07-22 DIAGNOSIS — T8452XD Infection and inflammatory reaction due to internal left hip prosthesis, subsequent encounter: Secondary | ICD-10-CM | POA: Diagnosis not present

## 2020-07-22 DIAGNOSIS — M47816 Spondylosis without myelopathy or radiculopathy, lumbar region: Secondary | ICD-10-CM | POA: Diagnosis not present

## 2020-07-23 DIAGNOSIS — G8929 Other chronic pain: Secondary | ICD-10-CM | POA: Diagnosis not present

## 2020-07-23 DIAGNOSIS — F3342 Major depressive disorder, recurrent, in full remission: Secondary | ICD-10-CM | POA: Diagnosis not present

## 2020-07-23 DIAGNOSIS — E039 Hypothyroidism, unspecified: Secondary | ICD-10-CM | POA: Diagnosis not present

## 2020-07-23 DIAGNOSIS — I10 Essential (primary) hypertension: Secondary | ICD-10-CM | POA: Diagnosis not present

## 2020-07-23 DIAGNOSIS — F900 Attention-deficit hyperactivity disorder, predominantly inattentive type: Secondary | ICD-10-CM | POA: Diagnosis not present

## 2020-07-23 DIAGNOSIS — D509 Iron deficiency anemia, unspecified: Secondary | ICD-10-CM | POA: Diagnosis not present

## 2020-07-23 DIAGNOSIS — M85852 Other specified disorders of bone density and structure, left thigh: Secondary | ICD-10-CM | POA: Diagnosis not present

## 2020-07-23 DIAGNOSIS — Z79899 Other long term (current) drug therapy: Secondary | ICD-10-CM | POA: Diagnosis not present

## 2020-07-23 DIAGNOSIS — R1013 Epigastric pain: Secondary | ICD-10-CM | POA: Diagnosis not present

## 2020-07-23 DIAGNOSIS — F329 Major depressive disorder, single episode, unspecified: Secondary | ICD-10-CM | POA: Diagnosis not present

## 2020-07-23 DIAGNOSIS — R112 Nausea with vomiting, unspecified: Secondary | ICD-10-CM | POA: Diagnosis not present

## 2020-07-23 DIAGNOSIS — M069 Rheumatoid arthritis, unspecified: Secondary | ICD-10-CM | POA: Diagnosis not present

## 2020-07-23 DIAGNOSIS — E871 Hypo-osmolality and hyponatremia: Secondary | ICD-10-CM | POA: Diagnosis not present

## 2020-08-20 DIAGNOSIS — X58XXXA Exposure to other specified factors, initial encounter: Secondary | ICD-10-CM | POA: Diagnosis not present

## 2020-08-20 DIAGNOSIS — Z96641 Presence of right artificial hip joint: Secondary | ICD-10-CM | POA: Diagnosis not present

## 2020-08-20 DIAGNOSIS — S32592A Other specified fracture of left pubis, initial encounter for closed fracture: Secondary | ICD-10-CM | POA: Diagnosis not present

## 2020-09-03 DIAGNOSIS — M4316 Spondylolisthesis, lumbar region: Secondary | ICD-10-CM | POA: Diagnosis not present

## 2020-09-03 DIAGNOSIS — Z4789 Encounter for other orthopedic aftercare: Secondary | ICD-10-CM | POA: Diagnosis not present

## 2020-09-03 DIAGNOSIS — M545 Low back pain, unspecified: Secondary | ICD-10-CM | POA: Diagnosis not present

## 2020-09-03 DIAGNOSIS — S32591D Other specified fracture of right pubis, subsequent encounter for fracture with routine healing: Secondary | ICD-10-CM | POA: Diagnosis not present

## 2020-09-03 DIAGNOSIS — Z96643 Presence of artificial hip joint, bilateral: Secondary | ICD-10-CM | POA: Diagnosis not present

## 2020-09-03 DIAGNOSIS — M5416 Radiculopathy, lumbar region: Secondary | ICD-10-CM | POA: Diagnosis not present

## 2020-09-03 DIAGNOSIS — R1030 Lower abdominal pain, unspecified: Secondary | ICD-10-CM | POA: Diagnosis not present

## 2020-09-03 DIAGNOSIS — M47816 Spondylosis without myelopathy or radiculopathy, lumbar region: Secondary | ICD-10-CM | POA: Diagnosis not present

## 2020-09-03 DIAGNOSIS — M47817 Spondylosis without myelopathy or radiculopathy, lumbosacral region: Secondary | ICD-10-CM | POA: Diagnosis not present

## 2020-09-10 DIAGNOSIS — M25552 Pain in left hip: Secondary | ICD-10-CM | POA: Diagnosis not present

## 2020-09-11 DIAGNOSIS — F902 Attention-deficit hyperactivity disorder, combined type: Secondary | ICD-10-CM | POA: Diagnosis not present

## 2020-09-12 DIAGNOSIS — M810 Age-related osteoporosis without current pathological fracture: Secondary | ICD-10-CM | POA: Diagnosis not present

## 2020-09-17 DIAGNOSIS — M25552 Pain in left hip: Secondary | ICD-10-CM | POA: Diagnosis not present

## 2020-09-17 DIAGNOSIS — Z96642 Presence of left artificial hip joint: Secondary | ICD-10-CM | POA: Diagnosis not present

## 2020-09-22 DIAGNOSIS — M25552 Pain in left hip: Secondary | ICD-10-CM | POA: Diagnosis not present

## 2020-09-23 DIAGNOSIS — M461 Sacroiliitis, not elsewhere classified: Secondary | ICD-10-CM | POA: Diagnosis not present

## 2020-09-23 DIAGNOSIS — Z96642 Presence of left artificial hip joint: Secondary | ICD-10-CM | POA: Diagnosis not present

## 2020-09-23 DIAGNOSIS — M5416 Radiculopathy, lumbar region: Secondary | ICD-10-CM | POA: Diagnosis not present

## 2020-09-23 DIAGNOSIS — M4727 Other spondylosis with radiculopathy, lumbosacral region: Secondary | ICD-10-CM | POA: Diagnosis not present

## 2020-09-24 DIAGNOSIS — M25552 Pain in left hip: Secondary | ICD-10-CM | POA: Diagnosis not present

## 2020-09-29 DIAGNOSIS — M25552 Pain in left hip: Secondary | ICD-10-CM | POA: Diagnosis not present

## 2020-09-29 DIAGNOSIS — D509 Iron deficiency anemia, unspecified: Secondary | ICD-10-CM | POA: Diagnosis not present

## 2020-09-29 DIAGNOSIS — M810 Age-related osteoporosis without current pathological fracture: Secondary | ICD-10-CM | POA: Diagnosis not present

## 2020-10-03 DIAGNOSIS — M25552 Pain in left hip: Secondary | ICD-10-CM | POA: Diagnosis not present

## 2020-10-06 DIAGNOSIS — M25552 Pain in left hip: Secondary | ICD-10-CM | POA: Diagnosis not present

## 2020-10-08 DIAGNOSIS — M25552 Pain in left hip: Secondary | ICD-10-CM | POA: Diagnosis not present

## 2020-10-13 DIAGNOSIS — M25552 Pain in left hip: Secondary | ICD-10-CM | POA: Diagnosis not present

## 2020-10-14 ENCOUNTER — Ambulatory Visit (HOSPITAL_COMMUNITY)
Admission: RE | Admit: 2020-10-14 | Discharge: 2020-10-14 | Disposition: A | Discharge status: Home / Self Care | Payer: Medicare Other | Source: Ambulatory Visit | Attending: Family Medicine | Admitting: Family Medicine

## 2020-10-14 ENCOUNTER — Other Ambulatory Visit: Payer: Self-pay

## 2020-10-14 DIAGNOSIS — T84021S Dislocation of internal left hip prosthesis, sequela: Secondary | ICD-10-CM | POA: Diagnosis not present

## 2020-10-14 DIAGNOSIS — F329 Major depressive disorder, single episode, unspecified: Secondary | ICD-10-CM | POA: Diagnosis not present

## 2020-10-14 DIAGNOSIS — M810 Age-related osteoporosis without current pathological fracture: Secondary | ICD-10-CM | POA: Insufficient documentation

## 2020-10-14 DIAGNOSIS — E871 Hypo-osmolality and hyponatremia: Secondary | ICD-10-CM | POA: Diagnosis not present

## 2020-10-14 DIAGNOSIS — R1013 Epigastric pain: Secondary | ICD-10-CM | POA: Diagnosis not present

## 2020-10-14 DIAGNOSIS — G479 Sleep disorder, unspecified: Secondary | ICD-10-CM | POA: Diagnosis not present

## 2020-10-14 DIAGNOSIS — F900 Attention-deficit hyperactivity disorder, predominantly inattentive type: Secondary | ICD-10-CM | POA: Diagnosis not present

## 2020-10-14 DIAGNOSIS — E039 Hypothyroidism, unspecified: Secondary | ICD-10-CM | POA: Diagnosis not present

## 2020-10-14 DIAGNOSIS — F3342 Major depressive disorder, recurrent, in full remission: Secondary | ICD-10-CM | POA: Diagnosis not present

## 2020-10-14 DIAGNOSIS — I10 Essential (primary) hypertension: Secondary | ICD-10-CM | POA: Diagnosis not present

## 2020-10-14 DIAGNOSIS — G8929 Other chronic pain: Secondary | ICD-10-CM | POA: Diagnosis not present

## 2020-10-14 DIAGNOSIS — D2272 Melanocytic nevi of left lower limb, including hip: Secondary | ICD-10-CM | POA: Diagnosis not present

## 2020-10-14 MED ORDER — ZOLEDRONIC ACID 5 MG/100ML IV SOLN
INTRAVENOUS | Status: AC
  Administered 2020-10-14: 5 mg via INTRAVENOUS
  Filled 2020-10-14: qty 100

## 2020-10-14 MED ORDER — ZOLEDRONIC ACID 5 MG/100ML IV SOLN: 5.0000 mg | Freq: Once | INTRAVENOUS | Status: AC

## 2020-10-14 NOTE — Discharge Instructions (Signed)
Zoledronic Acid Injection (Paget's Disease, Osteoporosis) What is this medicine? ZOLEDRONIC ACID (ZOE le dron ik AS id) slows calcium loss from bones. It treats Paget's disease and osteoporosis. It may be used in other people at risk for bone loss. This medicine may be used for other purposes; ask your health care provider or pharmacist if you have questions. COMMON BRAND NAME(S): Reclast, Zometa What should I tell my health care provider before I take this medicine? They need to know if you have any of these conditions:  bleeding disorder  cancer  dental disease  kidney disease  low levels of calcium in the blood  low red blood cell counts  lung or breathing disease (asthma)  receiving steroids like dexamethasone or prednisone  an unusual or allergic reaction to zoledronic acid, other medicines, foods, dyes, or preservatives  pregnant or trying to get pregnant  breast-feeding How should I use this medicine? This drug is injected into a vein. It is given by a health care provider in a hospital or clinic setting. A special MedGuide will be given to you before each treatment. Be sure to read this information carefully each time. Talk to your health care provider about the use of this drug in children. Special care may be needed. Overdosage: If you think you have taken too much of this medicine contact a poison control center or emergency room at once. NOTE: This medicine is only for you. Do not share this medicine with others. What if I miss a dose? Keep appointments for follow-up doses. It is important not to miss your dose. Call your health care provider if you are unable to keep an appointment. What may interact with this medicine?  certain antibiotics given by injection  NSAIDs, medicines for pain and inflammation, like ibuprofen or naproxen  some diuretics like bumetanide, furosemide  teriparatide This list may not describe all possible interactions. Give your health  care provider a list of all the medicines, herbs, non-prescription drugs, or dietary supplements you use. Also tell them if you smoke, drink alcohol, or use illegal drugs. Some items may interact with your medicine. What should I watch for while using this medicine? Visit your health care provider for regular checks on your progress. It may be some time before you see the benefit from this drug. Some people who take this drug have severe bone, joint, or muscle pain. This drug may also increase your risk for jaw problems or a broken thigh bone. Tell your health care provider right away if you have severe pain in your jaw, bones, joints, or muscles. Tell you health care provider if you have any pain that does not go away or that gets worse. You should make sure you get enough calcium and vitamin D while you are taking this drug. Discuss the foods you eat and the vitamins you take with your health care provider. You may need blood work done while you are taking this drug. Tell your dentist and dental surgeon that you are taking this drug. You should not have major dental surgery while on this drug. See your dentist to have a dental exam and fix any dental problems before starting this drug. Take good care of your teeth while on this drug. Make sure you see your dentist for regular follow-up appointments. What side effects may I notice from receiving this medicine? Side effects that you should report to your doctor or health care provider as soon as possible:  allergic reactions (skin rash, itching or   hives; swelling of the face, lips, or tongue)  bone pain  infection (fever, chills, cough, sore throat, pain or trouble passing urine)  jaw pain, especially after dental work  joint pain  kidney injury (trouble passing urine or change in the amount of urine)  low calcium levels (fast heartbeat; muscle cramps or pain; pain, tingling, or numbness in the hands or feet; seizures)  low red blood cell  counts (trouble breathing; feeling faint; lightheaded, falls; unusually weak or tired)  muscle pain  palpitations  redness, blistering, peeling, or loosening of the skin, including inside the mouth Side effects that usually do not require medical attention (report to your doctor or health care provider if they continue or are bothersome):  diarrhea  eye irritation, itching, or pain  fever  general ill feeling or flu-like symptoms  headache  increase in blood pressure  nausea  pain, redness, or irritation at site where injected  stomach pain  upset stomach This list may not describe all possible side effects. Call your doctor for medical advice about side effects. You may report side effects to FDA at 1-800-FDA-1088. Where should I keep my medicine? This drug is given in a hospital or clinic. It will not be stored at home. NOTE: This sheet is a summary. It may not cover all possible information. If you have questions about this medicine, talk to your doctor, pharmacist, or health care provider.  2021 Elsevier/Gold Standard (2019-05-07 11:46:18)   

## 2020-10-15 DIAGNOSIS — Z471 Aftercare following joint replacement surgery: Secondary | ICD-10-CM | POA: Diagnosis not present

## 2020-10-15 DIAGNOSIS — Z96643 Presence of artificial hip joint, bilateral: Secondary | ICD-10-CM | POA: Diagnosis not present

## 2020-10-15 DIAGNOSIS — T8452XD Infection and inflammatory reaction due to internal left hip prosthesis, subsequent encounter: Secondary | ICD-10-CM | POA: Diagnosis not present

## 2020-10-15 DIAGNOSIS — M25352 Other instability, left hip: Secondary | ICD-10-CM | POA: Diagnosis not present

## 2020-10-15 DIAGNOSIS — Z96642 Presence of left artificial hip joint: Secondary | ICD-10-CM | POA: Diagnosis not present

## 2020-10-16 DIAGNOSIS — M25552 Pain in left hip: Secondary | ICD-10-CM | POA: Diagnosis not present

## 2020-10-23 DIAGNOSIS — M25552 Pain in left hip: Secondary | ICD-10-CM | POA: Diagnosis not present

## 2020-10-30 DIAGNOSIS — M25552 Pain in left hip: Secondary | ICD-10-CM | POA: Diagnosis not present

## 2020-11-03 DIAGNOSIS — M25552 Pain in left hip: Secondary | ICD-10-CM | POA: Diagnosis not present

## 2020-11-06 DIAGNOSIS — M25552 Pain in left hip: Secondary | ICD-10-CM | POA: Diagnosis not present

## 2020-11-10 DIAGNOSIS — M25552 Pain in left hip: Secondary | ICD-10-CM | POA: Diagnosis not present

## 2020-11-13 DIAGNOSIS — M25552 Pain in left hip: Secondary | ICD-10-CM | POA: Diagnosis not present

## 2020-11-17 DIAGNOSIS — M25552 Pain in left hip: Secondary | ICD-10-CM | POA: Diagnosis not present

## 2020-11-20 DIAGNOSIS — M25552 Pain in left hip: Secondary | ICD-10-CM | POA: Diagnosis not present

## 2020-11-24 DIAGNOSIS — M25552 Pain in left hip: Secondary | ICD-10-CM | POA: Diagnosis not present

## 2020-11-27 DIAGNOSIS — Z23 Encounter for immunization: Secondary | ICD-10-CM | POA: Diagnosis not present

## 2020-11-27 DIAGNOSIS — M25552 Pain in left hip: Secondary | ICD-10-CM | POA: Diagnosis not present

## 2020-12-02 DIAGNOSIS — M25552 Pain in left hip: Secondary | ICD-10-CM | POA: Diagnosis not present

## 2020-12-02 DIAGNOSIS — Z96642 Presence of left artificial hip joint: Secondary | ICD-10-CM | POA: Diagnosis not present

## 2020-12-02 DIAGNOSIS — Z96643 Presence of artificial hip joint, bilateral: Secondary | ICD-10-CM | POA: Diagnosis not present

## 2020-12-03 DIAGNOSIS — M25552 Pain in left hip: Secondary | ICD-10-CM | POA: Diagnosis not present

## 2020-12-05 DIAGNOSIS — M25552 Pain in left hip: Secondary | ICD-10-CM | POA: Diagnosis not present

## 2020-12-08 DIAGNOSIS — M25552 Pain in left hip: Secondary | ICD-10-CM | POA: Diagnosis not present

## 2020-12-10 DIAGNOSIS — M47816 Spondylosis without myelopathy or radiculopathy, lumbar region: Secondary | ICD-10-CM | POA: Diagnosis not present

## 2020-12-10 DIAGNOSIS — M5416 Radiculopathy, lumbar region: Secondary | ICD-10-CM | POA: Diagnosis not present

## 2020-12-10 DIAGNOSIS — M4316 Spondylolisthesis, lumbar region: Secondary | ICD-10-CM | POA: Diagnosis not present

## 2020-12-11 DIAGNOSIS — M25552 Pain in left hip: Secondary | ICD-10-CM | POA: Diagnosis not present

## 2020-12-15 DIAGNOSIS — M25552 Pain in left hip: Secondary | ICD-10-CM | POA: Diagnosis not present

## 2020-12-22 DIAGNOSIS — M25552 Pain in left hip: Secondary | ICD-10-CM | POA: Diagnosis not present

## 2020-12-25 DIAGNOSIS — M25552 Pain in left hip: Secondary | ICD-10-CM | POA: Diagnosis not present

## 2020-12-30 DIAGNOSIS — F902 Attention-deficit hyperactivity disorder, combined type: Secondary | ICD-10-CM | POA: Diagnosis not present

## 2021-01-05 DIAGNOSIS — M25552 Pain in left hip: Secondary | ICD-10-CM | POA: Diagnosis not present

## 2021-01-08 DIAGNOSIS — M25552 Pain in left hip: Secondary | ICD-10-CM | POA: Diagnosis not present

## 2021-01-14 DIAGNOSIS — U071 COVID-19: Secondary | ICD-10-CM | POA: Diagnosis not present

## 2021-01-24 DIAGNOSIS — U071 COVID-19: Secondary | ICD-10-CM | POA: Diagnosis not present

## 2021-03-02 ENCOUNTER — Other Ambulatory Visit: Payer: Self-pay

## 2021-03-02 ENCOUNTER — Encounter: Payer: Self-pay | Admitting: Family Medicine

## 2021-03-02 ENCOUNTER — Ambulatory Visit (INDEPENDENT_AMBULATORY_CARE_PROVIDER_SITE_OTHER): Payer: Medicare Other | Admitting: Family Medicine

## 2021-03-02 VITALS — BP 128/75 | Ht 64.5 in | Wt 132.0 lb

## 2021-03-02 DIAGNOSIS — M25512 Pain in left shoulder: Secondary | ICD-10-CM | POA: Diagnosis not present

## 2021-03-02 DIAGNOSIS — M25462 Effusion, left knee: Secondary | ICD-10-CM

## 2021-03-02 DIAGNOSIS — G8929 Other chronic pain: Secondary | ICD-10-CM | POA: Diagnosis not present

## 2021-03-02 MED ORDER — METHYLPREDNISOLONE ACETATE 40 MG/ML IJ SUSP
40.0000 mg | Freq: Once | INTRAMUSCULAR | Status: AC
  Administered 2021-03-02: 40 mg via INTRA_ARTICULAR

## 2021-03-02 NOTE — Progress Notes (Signed)
PCP: Cari Caraway, MD  Subjective:   HPI: Patient is a 70 y.o. female here for left shoulder pain.  Patient has had a difficult past year. She had right hip infection requiring removal of hardware, replacement of hip that then had to be revised due to repeat dislocation post-arthroplasty. She's been using a cane and very slowly improving over past 5-6 months. Still swimming but not as much. Has had worsening lateral left shoulder pain, difficulty sleeping on side with this, pain reaching overhead. She also mentioned left knee has been very swollen, tight, with some soreness - asking about possibly draining this.  Past Medical History:  Diagnosis Date   Rheumatoid arthritis ([place])     Current Outpatient Medications on File Prior to Visit  Medication Sig Dispense Refill   amoxicillin (AMOXIL) 500 MG capsule Take 500 mg by mouth 3 (three) times daily.     amphetamine-dextroamphetamine (ADDERALL) 20 MG tablet Take 20 mg by mouth See admin instructions. Take one tablet (20 mg) by mouth twice daily - breakfast and lunch     DULoxetine (CYMBALTA) 60 MG capsule Take 60 mg by mouth daily.     estradiol (ESTRACE) 1 MG tablet Take 1 mg by mouth daily.   0   gabapentin (NEURONTIN) 600 MG tablet Take 1,200 mg by mouth 3 (three) times daily.      levothyroxine (SYNTHROID, LEVOTHROID) 75 MCG tablet Take 1 tablet (75 mcg total) by mouth daily before breakfast. **PT NEEDS FOLLOW UP APPT** (Patient taking differently: Take 75 mcg by mouth daily before breakfast. ) 45 tablet 0   LIDOCAINE EX Apply 1 application topically 2 (two) times daily as needed (shoulder pain).     meloxicam (MOBIC) 7.5 MG tablet Take 7.5 mg by mouth daily.     methocarbamol (ROBAXIN) 750 MG tablet Take 1,500 mg by mouth 2 (two) times daily as needed (pain).      Multiple Vitamin (MULTIVITAMIN WITH MINERALS) TABS tablet Take 1 tablet by mouth daily.     NIFEdipine (PROCARDIA-XL/ADALAT-CC/NIFEDICAL-XL) 30 MG 24 hr tablet Take 30 mg  by mouth daily.      oxycodone (OXY-IR) 5 MG capsule Take 1 capsule (5 mg total) by mouth every 6 (six) hours as needed. 6 capsule 0   polyvinyl alcohol (ARTIFICIAL TEARS) 1.4 % ophthalmic solution Place 1 drop into both eyes daily.     traMADol (ULTRAM) 50 MG tablet Take 50 mg by mouth 2 (two) times daily as needed (pain). for pain     No current facility-administered medications on file prior to visit.    Past Surgical History:  Procedure Laterality Date   BREAST BIOPSY Left    pt not sure of date.   HIP SURGERY     SHOULDER SURGERY      Allergies  Allergen Reactions   Ketamine Other (See Comments)    Hallucination reaction after received 100 mg of ketamine, combined with propofol 100 mg, for sedation.    Social History   Socioeconomic History   Marital status: Married    Spouse name: Not on file   Number of children: 1   Years of education: Not on file   Highest education level: Not on file  Occupational History   Occupation: Futures trader  Tobacco Use   Smoking status: Former   Smokeless tobacco: Never  Substance and Sexual Activity   Alcohol use: Yes    Alcohol/week: 7.0 standard drinks    Types: 7 Standard drinks or equivalent per week  Drug use: No   Sexual activity: Not on file  Other Topics Concern   Not on file  Social History Narrative   Futures trader: Married    2 children; ages 21 and 60   First menstrual cycle at 11 yrs   Partial hysterectomy at 39 yrs   7 pregnancies, 6 miscarriages, 1 birth   Quit smoking in [place] - 2 glasses, daily   Regular execise: walking 2 times a week; Swimming 4 times a week   Social Determinants of Radio broadcast assistant Strain: Not on file  Food Insecurity: Not on file  Transportation Needs: Not on file  Physical Activity: Not on file  Stress: Not on file  Social Connections: Not on file  Intimate Partner Violence: Not on file    History reviewed. No pertinent family history.  BP 128/75    Ht 5' 4.5" (1.638 m)   Wt 132 lb (59.9 kg)   BMI 22.31 kg/m   No flowsheet data found.  No flowsheet data found.  Review of Systems: See HPI above.     Objective:  Physical Exam:  Gen: NAD, comfortable in exam room  Left shoulder: No swelling, ecchymoses.  No gross deformity. No TTP AC joint.  Mild tenderness biceps tendon. FROM. Positive Hawkins, Neers. Negative Yergasons. Strength 4/5 with empty can and resisted external rotation.  5/5 IR NV intact distally.   Left knee: Large effusion.  No other gross deformity, ecchymoses. No joint line TTP. FROM with normal strength. Negative ant/post drawers. Negative valgus/varus testing. Negative lachman. Negative mcmurrays, apleys.  NV intact distally.  Assessment & Plan:  1. Left shoulder pain - consistent with rotator cuff impingement.  Limited MSK u/s prior to injection noted moderate subacromial bursitis and similar to right shoulder, supraspinatus and infraspinatus appear to be chronically torn.  Surprising strength and motion for these findings.  Subacromial injection given.  Start home exercises.  F/u in 6 weeks.  After informed written consent timeout was performed, patient was seated in chair in exam room. Left shoulder was prepped with alcohol swab and utilizing lateral approach with ultrasound guidance, patient's left subacromial space was injected with 3:1 lidocaine: depomedrol. Patient tolerated the procedure well without immediate complications.  2. Left knee effusion - known arthropathy in other joints, otherwise benign exam.  Likely due to arthritis.  Aspirated and injected today.  After informed written consent timeout was performed, patient was lying supine on exam table.  Left knee was prepped with alcohol swab.  Utilizing superolateral approach, 3 mL of lidocaine was used for local anesthesia.  Then 68 mL of clear straw-colored fluid was aspirated from left knee.  Knee was then injected with 3:1  lidocaine:depomedrol.  Patient tolerated procedure well without immediate complications

## 2021-03-02 NOTE — Patient Instructions (Signed)
You have rotator cuff impingement of your left shoulder. Try to avoid painful activities (overhead activities, lifting with extended arm) as much as possible. Subacromial injection may be beneficial to help with pain and to decrease inflammation - you were given this today. Consider physical therapy with transition to home exercise program. Do home exercise program with theraband and scapular stabilization exercises daily 3 sets of 10 once a day - wait a few days before starting this. If not improving at follow-up we will consider further imaging, physical therapy, and/or nitro patches. Follow up with me in 6 weeks or as needed if you're doing well.  Your knee swelling is likely from arthritis. We aspirated and injected this today as well. Your pain is due to arthritis. These are the typical arthritis instructions: These are the different medications you can take for this: Tylenol '500mg'$  1-2 tabs three times a day for pain. Capsaicin, aspercreme, or biofreeze topically up to four times a day may also help with pain. Some supplements that may help for arthritis: Boswellia extract, curcumin, pycnogenol Aleve 1-2 tabs twice a day with food Cortisone injections are an option. If cortisone injections do not help, there are different types of shots that may help but they take longer to take effect. It's important that you continue to stay active. Straight leg raises, knee extensions 3 sets of 10 once a day (add ankle weight if these become too easy). Consider physical therapy to strengthen muscles around the joint that hurts to take pressure off of the joint itself. Shoe inserts with good arch support may be helpful. Heat or ice 15 minutes at a time 3-4 times a day as needed to help with pain. Water aerobics and cycling with low resistance are the best two types of exercise for arthritis though any exercise is ok as long as it doesn't worsen the pain.

## 2021-03-12 DIAGNOSIS — I872 Venous insufficiency (chronic) (peripheral): Secondary | ICD-10-CM | POA: Diagnosis not present

## 2021-03-12 DIAGNOSIS — G479 Sleep disorder, unspecified: Secondary | ICD-10-CM | POA: Diagnosis not present

## 2021-03-12 DIAGNOSIS — M85852 Other specified disorders of bone density and structure, left thigh: Secondary | ICD-10-CM | POA: Diagnosis not present

## 2021-03-12 DIAGNOSIS — E039 Hypothyroidism, unspecified: Secondary | ICD-10-CM | POA: Diagnosis not present

## 2021-03-12 DIAGNOSIS — F3341 Major depressive disorder, recurrent, in partial remission: Secondary | ICD-10-CM | POA: Diagnosis not present

## 2021-03-12 DIAGNOSIS — Z79899 Other long term (current) drug therapy: Secondary | ICD-10-CM | POA: Diagnosis not present

## 2021-03-12 DIAGNOSIS — R1013 Epigastric pain: Secondary | ICD-10-CM | POA: Diagnosis not present

## 2021-03-12 DIAGNOSIS — I10 Essential (primary) hypertension: Secondary | ICD-10-CM | POA: Diagnosis not present

## 2021-03-12 DIAGNOSIS — G8929 Other chronic pain: Secondary | ICD-10-CM | POA: Diagnosis not present

## 2021-03-12 DIAGNOSIS — F900 Attention-deficit hyperactivity disorder, predominantly inattentive type: Secondary | ICD-10-CM | POA: Diagnosis not present

## 2021-03-12 DIAGNOSIS — E559 Vitamin D deficiency, unspecified: Secondary | ICD-10-CM | POA: Diagnosis not present

## 2021-03-26 DIAGNOSIS — F902 Attention-deficit hyperactivity disorder, combined type: Secondary | ICD-10-CM | POA: Diagnosis not present

## 2021-04-01 ENCOUNTER — Other Ambulatory Visit: Payer: Self-pay

## 2021-04-01 MED ORDER — NITROGLYCERIN 0.2 MG/HR TD PT24: MEDICATED_PATCH | TRANSDERMAL | 1 refills | Status: AC

## 2021-04-01 NOTE — Progress Notes (Signed)
Pt is asking for nitroglycerin patches be sent to pharmacy for her shoulder.   [facility]

## 2021-04-02 DIAGNOSIS — R3989 Other symptoms and signs involving the genitourinary system: Secondary | ICD-10-CM | POA: Diagnosis not present

## 2021-04-02 DIAGNOSIS — N898 Other specified noninflammatory disorders of vagina: Secondary | ICD-10-CM | POA: Diagnosis not present

## 2021-04-07 DIAGNOSIS — M4316 Spondylolisthesis, lumbar region: Secondary | ICD-10-CM | POA: Diagnosis not present

## 2021-04-07 DIAGNOSIS — M5416 Radiculopathy, lumbar region: Secondary | ICD-10-CM | POA: Diagnosis not present

## 2021-04-07 DIAGNOSIS — M47816 Spondylosis without myelopathy or radiculopathy, lumbar region: Secondary | ICD-10-CM | POA: Diagnosis not present

## 2021-05-04 ENCOUNTER — Ambulatory Visit (INDEPENDENT_AMBULATORY_CARE_PROVIDER_SITE_OTHER): Payer: Medicare Other | Admitting: Family Medicine

## 2021-05-04 VITALS — Ht 64.5 in | Wt 132.0 lb

## 2021-05-04 DIAGNOSIS — M25511 Pain in right shoulder: Secondary | ICD-10-CM

## 2021-05-04 DIAGNOSIS — G8929 Other chronic pain: Secondary | ICD-10-CM | POA: Diagnosis not present

## 2021-05-04 DIAGNOSIS — M25512 Pain in left shoulder: Secondary | ICD-10-CM | POA: Diagnosis not present

## 2021-05-04 MED ORDER — METHYLPREDNISOLONE ACETATE 40 MG/ML IJ SUSP
40.0000 mg | Freq: Once | INTRAMUSCULAR | Status: AC
  Administered 2021-05-04: 40 mg via INTRA_ARTICULAR

## 2021-05-04 NOTE — Patient Instructions (Addendum)
We injected both of your shoulders today (drained and injected the right). Consider formal physical therapy. Unfortunately the nitro patches have not helped. Consider MRI of the more symptomatic shoulder but typically this is just in preparation for a shoulder replacement. Follow up with me as needed otherwise.

## 2021-05-04 NOTE — Progress Notes (Signed)
PCP: Cari Caraway, MD  Subjective:   HPI: Patient is a 70 y.o. female here for left shoulder pain.  8/01 Patient has had a difficult past year. She had right hip infection requiring removal of hardware, replacement of hip that then had to be revised due to repeat dislocation post-arthroplasty. She's been using a cane and very slowly improving over past 5-6 months. Still swimming but not as much. Has had worsening lateral left shoulder pain, difficulty sleeping on side with this, pain reaching overhead. She also mentioned left knee has been very swollen, tight, with some soreness - asking about possibly draining this.  10/3 Reports minimal relief after left shoulder injection at last visit, but would like to discuss possible injection in left and right shoulder today. She continues to swim and finds shoulder pain worse with this activity. Left knee has only minimal swelling and much improved from last visit. She does not need it to be examined today.   Ht 5' 4.5" (1.638 m)   Wt 132 lb (59.9 kg)   BMI 22.31 kg/m   [place] Adult Exercise 05/04/2021  Frequency of aerobic exercise (# of days/week) 3  Average time in minutes 60  Frequency of strengthening activities (# of days/week) 0    Review of Systems: See HPI above.     Objective:  Physical Exam:  Gen: NAD, comfortable in exam room  L Shoulder: No obvious deformity or asymmetry. No bruising. No swelling NO TTP over AC joint  Limited ROM in abduction and flexion to 120 degrees with pain.  Grinding/clunking felt on passive motion as well. NV intact distally Special Tests:  - Impingement: positive Hawkins and Neers.  - Supraspinatus: positive 4/5 strength  - Infraspinatus/Teres: 4/5 strength with ER - Subscapularis: 4/5 strength with IR - Biceps tendon: Negative Speeds.   R. Shoulder No obvious deformity or asymmetry. No bruising. No swelling No TTP Full  ROM in flexion, abduction, internal/external rotation NV intact distally Special Tests:  - Impingement: Positive Hawkins and Neers.  - Supraspinatus: Unable to lift above 45 degrees - Infraspinatus/Teres: 3/5 strength with ER - Subscapularis: 4/5 strength with IR - Biceps tendon: Negative Speeds.   Assessment & Plan:  Left shoulder pain - presents for follow up. As noted on last visit with Dr.Hudnall, patient has chronically torn supraspinatus and infraspinatus. No relief with subacromial injection on last visit. Also signs of mod-severe glenohumeral osteoarthritis on [place] today, glenohumeral injection given. No relief with nitro patches. Not interested in surgery at this time, so no reason to obtain MRI.   After informed written consent timeout was performed, patient was seated in chair in exam room. Left shoulder was prepped with alcohol swab and utilizing ultrasound guidance, patient's left glenohumeral space was injected with 3:1 lidocaine: depomedrol. Patient tolerated the procedure well without immediate complications.  Right shoulder pain - decrease ROM with abduction , pain and compensation with shoulder shrug at 60 degrees. Right shoulder with moderate effusion drained and injected. No relief with nitro patches. Not interested in surgery at this time, so no reason to obtain MRI.   - Follow up as needed  After informed written consent timeout was performed, patient was seated in chair in exam room. Right shoulder was prepped with alcohol swab. 55mL lidocaine used for local anesthesia.  Utilizing lateral approach with ultrasound guidance, 30mL clear yellow fluid aspirated from right subacromial space then injected with 3:1 lidocaine: depomedrol. Patient tolerated the procedure well without immediate complications.

## 2021-05-06 DIAGNOSIS — Z01419 Encounter for gynecological examination (general) (routine) without abnormal findings: Secondary | ICD-10-CM | POA: Diagnosis not present

## 2021-05-29 DIAGNOSIS — Z23 Encounter for immunization: Secondary | ICD-10-CM | POA: Diagnosis not present

## 2021-06-15 DIAGNOSIS — M47816 Spondylosis without myelopathy or radiculopathy, lumbar region: Secondary | ICD-10-CM | POA: Diagnosis not present

## 2021-06-15 DIAGNOSIS — M5416 Radiculopathy, lumbar region: Secondary | ICD-10-CM | POA: Diagnosis not present

## 2021-06-15 DIAGNOSIS — M4316 Spondylolisthesis, lumbar region: Secondary | ICD-10-CM | POA: Diagnosis not present

## 2021-07-15 DIAGNOSIS — F902 Attention-deficit hyperactivity disorder, combined type: Secondary | ICD-10-CM | POA: Diagnosis not present

## 2021-07-15 DIAGNOSIS — S32592D Other specified fracture of left pubis, subsequent encounter for fracture with routine healing: Secondary | ICD-10-CM | POA: Diagnosis not present

## 2021-07-15 DIAGNOSIS — X58XXXD Exposure to other specified factors, subsequent encounter: Secondary | ICD-10-CM | POA: Diagnosis not present

## 2021-07-15 DIAGNOSIS — M2550 Pain in unspecified joint: Secondary | ICD-10-CM | POA: Diagnosis not present

## 2021-07-15 DIAGNOSIS — Z96649 Presence of unspecified artificial hip joint: Secondary | ICD-10-CM | POA: Diagnosis not present

## 2021-07-15 DIAGNOSIS — Z96642 Presence of left artificial hip joint: Secondary | ICD-10-CM | POA: Diagnosis not present

## 2021-07-20 DIAGNOSIS — R29898 Other symptoms and signs involving the musculoskeletal system: Secondary | ICD-10-CM | POA: Diagnosis not present

## 2021-07-20 DIAGNOSIS — M25559 Pain in unspecified hip: Secondary | ICD-10-CM | POA: Diagnosis not present

## 2021-07-20 DIAGNOSIS — S329XXG Fracture of unspecified parts of lumbosacral spine and pelvis, subsequent encounter for fracture with delayed healing: Secondary | ICD-10-CM | POA: Diagnosis not present

## 2021-07-20 DIAGNOSIS — M25511 Pain in right shoulder: Secondary | ICD-10-CM | POA: Diagnosis not present

## 2021-08-04 DIAGNOSIS — S3282XA Multiple fractures of pelvis without disruption of pelvic ring, initial encounter for closed fracture: Secondary | ICD-10-CM | POA: Diagnosis not present

## 2021-08-04 DIAGNOSIS — Z96642 Presence of left artificial hip joint: Secondary | ICD-10-CM | POA: Diagnosis not present

## 2021-08-04 DIAGNOSIS — Z20822 Contact with and (suspected) exposure to covid-19: Secondary | ICD-10-CM | POA: Diagnosis not present

## 2021-08-04 DIAGNOSIS — X58XXXA Exposure to other specified factors, initial encounter: Secondary | ICD-10-CM | POA: Diagnosis not present

## 2021-08-04 DIAGNOSIS — Z96643 Presence of artificial hip joint, bilateral: Secondary | ICD-10-CM | POA: Diagnosis not present

## 2021-08-04 DIAGNOSIS — Z96649 Presence of unspecified artificial hip joint: Secondary | ICD-10-CM | POA: Diagnosis not present

## 2021-08-11 ENCOUNTER — Encounter (HOSPITAL_BASED_OUTPATIENT_CLINIC_OR_DEPARTMENT_OTHER): Payer: Self-pay | Admitting: Physical Therapy

## 2021-08-11 ENCOUNTER — Ambulatory Visit (HOSPITAL_BASED_OUTPATIENT_CLINIC_OR_DEPARTMENT_OTHER): Payer: Medicare Other | Attending: Family Medicine | Admitting: Physical Therapy

## 2021-08-11 ENCOUNTER — Other Ambulatory Visit: Payer: Self-pay

## 2021-08-11 DIAGNOSIS — R29898 Other symptoms and signs involving the musculoskeletal system: Secondary | ICD-10-CM | POA: Diagnosis not present

## 2021-08-11 DIAGNOSIS — M545 Low back pain, unspecified: Secondary | ICD-10-CM | POA: Insufficient documentation

## 2021-08-11 DIAGNOSIS — M25551 Pain in right hip: Secondary | ICD-10-CM | POA: Insufficient documentation

## 2021-08-11 DIAGNOSIS — G8929 Other chronic pain: Secondary | ICD-10-CM | POA: Insufficient documentation

## 2021-08-11 DIAGNOSIS — M25559 Pain in unspecified hip: Secondary | ICD-10-CM | POA: Diagnosis present

## 2021-08-11 DIAGNOSIS — R2689 Other abnormalities of gait and mobility: Secondary | ICD-10-CM | POA: Insufficient documentation

## 2021-08-11 DIAGNOSIS — M25511 Pain in right shoulder: Secondary | ICD-10-CM | POA: Diagnosis not present

## 2021-08-11 DIAGNOSIS — M25552 Pain in left hip: Secondary | ICD-10-CM | POA: Insufficient documentation

## 2021-08-11 NOTE — Therapy (Signed)
OUTPATIENT PHYSICAL THERAPY THORACOLUMBAR EVALUATION   Patient Name: Virginia Gordon MRN: [id number] DOB:1951-02-27, 71 y.o., female Today's Date: 08/12/2021   PT End of Session - 08/12/21 1702     Visit Number 1    Number of Visits 12    Date for PT Re-Evaluation 09/23/21    PT Start Time 4010    PT Stop Time 1429    PT Time Calculation (min) 44 min    Activity Tolerance Patient tolerated treatment well    Behavior During Therapy [facility] for tasks assessed/performed             Past Medical History:  Diagnosis Date   Rheumatoid arthritis ([place])    Past Surgical History:  Procedure Laterality Date   BREAST BIOPSY Left    pt not sure of date.   HIP SURGERY     SHOULDER SURGERY     Patient Active Problem List   Diagnosis Date Noted   ADD (attention deficit disorder) 11/13/2015   Connective tissue disease overlap syndrome ([place]) 11/13/2015   Menopausal and perimenopausal disorder 11/13/2015   Acne erythematosa 11/13/2015   Disordered sleep 11/13/2015   History of operative procedure on hip 07/03/2015   Left knee pain 05/20/2014   Right foot pain 05/14/2014   Bilateral shoulder pain 05/14/2014   Hypothyroidism 03/08/2014   Degenerative arthritis of hip 03/07/2012    PCP: Cari Caraway, MD  REFERRING PROVIDER: Cari Caraway, MD  REFERRING DIAG:   THERAPY DIAG:  Chronic right shoulder pain  Pain in right hip  Chronic bilateral low back pain without sciatica  Pain in left hip  Other abnormalities of gait and mobility  ONSET DATE: Several years   SUBJECTIVE:                                                                                                                                                                                           SUBJECTIVE STATEMENT: Patient has a long history of hip and shoulder pain. She has had 6 surgeries on the left hip starting in 2001. She had a replacement that got infected. She also has pain in the right side of her back  and into her right hip. She uses a cane for ambulation. She had 2 RTC repairs on the right side. It is now completely torn. She has limited fucntional use of the arm.  PERTINENT HISTORY:  Charcott foot, Left hip replacement and revision; right hip revision, Scoliosis, DDD, Lumbar compression fracture; Right Shoulder RTC tear( repaired 2x) Rheumatoid arthritis ( although tests were negative; R foot pain;   PAIN:  Are you having pain? No VAS scale: 5/10  over the past few days  Pain location: down the back  Pain orientation: Right  PAIN TYPE: aching Pain description: intermittent  Aggravating factors: standing and walking  Relieving factors: rest   Are you having pain? Yes VAS scale: 5/10 Pain location: low back  Pain orientation: Right and Left  PAIN TYPE: aching Pain description: intermittent  Aggravating factors: standing and walking  Relieving factors:rest   Are you having pain? Yes VAS scale: 7/10 Pain location: right anterior shoulder  Pain orientation: Right  PAIN TYPE: aching Pain description: intermittent  Aggravating factors: use of the arm  Relieving factors: rest; heat, ice  PRECAUTIONS: None  WEIGHT BEARING RESTRICTIONS No  FALLS:  Has patient fallen in last 6 months? No, Number of falls:   LIVING ENVIRONMENT: 2 steps into the house  OCCUPATION:  Retired. Was working at [employer] but had to retire   PLOF: Independent with household mobility with device  PATIENT GOALS  To get rid of her cane    OBJECTIVE:   DIAGNOSTIC FINDINGS:    PATIENT SURVEYS:  FOTO    SCREENING FOR RED FLAGS: Bowel or bladder incontinence: No Spinal tumors: No Cauda equina syndrome: No Compression fracture: Yes: unspecified segment  Abdominal aneurysm: No  COGNITION:  Overall cognitive status: Within functional limits for tasks assessed     SENSATION:  Light touch: Appears intact  Stereognosis: Appears intact  Hot/Cold: Appears intact  Proprioception:  Appears intact  POSTURE:  Elevated shoulders   PALPATION: Tender to palpation in right gluteal and right lateral hip; spasming in bilateral upper traps   LUMBARAROM/PROM  Not formally assesed 2nd to balance and time  (Blank rows = not tested)  LE AROM/PROM:  A/PROM Right 08/12/2021 Left 08/12/2021  Hip flexion 90 with pain   90 with pain   Hip extension    Hip abduction    Hip adduction    Hip internal rotation Painful  Painful   Hip external rotation Painful  Painful   Knee flexion    Knee extension    Ankle dorsiflexion    Ankle plantarflexion    Ankle inversion    Ankle eversion     (Blank rows = not tested)  Shoulder flexion:  60 degrees with pain on the right  ER: pain behind the back  LE MMT:  MMT Right 08/12/2021 Left 08/12/2021  Hip flexion 22.5 20.7  Hip extension    Hip abduction 25.9 23.7  Hip adduction    Hip internal rotation    Hip external rotation    Knee flexion    Knee extension 29.6 22.3  Ankle dorsiflexion    Ankle plantarflexion    Ankle inversion    Ankle eversion     (Blank rows = not  tested)  FUNCTIONAL TESTS:  Slow transfer from sit to stand  GAIT: Left knee valgus with gait and left knee instability.   Single point cane TODAY'S TREATMENT  Ran out of time 2nd to multi joint assessment    PATIENT EDUCATION:  Education details: Benefits of water, Goals  Person educated: Patient Education method: Explanation, Demonstration, Tactile cues, and Verbal cues Education comprehension: verbalized understanding, returned demonstration, verbal cues required, tactile cues required, and needs further education   HOME EXERCISE PROGRAM: Unable to give 2nd to time constraints  ASSESSMENT:  CLINICAL IMPRESSION: Patient is a 71 year old female who presents to therapy for evaluation of right shoulder , low back, right and left hip pain. She has limited active flexion  of her right shoulder. She has limited strength in her right shoulder   and limited gross bilateral LE strength. She would benefit from skilled therapy to develop a pool, land and gym program.   Objective impairments include Abnormal gait, decreased activity tolerance, decreased balance, difficulty walking, decreased ROM, decreased strength, increased fascial restrictions, increased muscle spasms, improper body mechanics, postural dysfunction, and pain. These impairments are limiting patient from cleaning, community activity, meal prep, occupation, shopping, and yard work. Personal factors including Charcott foot, Left hip replacement and revision; right hip revision, Scoliosis, DDD, Lumbar compression fracture; Right Shoulder RTC tea are also affecting patient's functional outcome. Patient will benefit from skilled PT to address above impairments and improve overall function.  REHAB POTENTIAL: Good  CLINICAL DECISION MAKING: Evolving/moderate complexity increasing pain in several joints  EVALUATION COMPLEXITY: Moderate   GOALS: Goals reviewed with patient? Yes  SHORT TERM GOALS:  STG Name Target Date Goal status  1 Patient will increase active shoulder flexion by 10 degrees Baseline:  09/02/2021 INITIAL  2 Patient will increase bilateral gross LE strength by 5 lbs  Baseline:  09/02/2021 INITIAL  3 Patient will be independent with basic HEP for UE/LE and low back  Baseline: 09/02/2021 INITIAL  LONG TERM GOALS:   LTG Name Target Date Goal status  1 Patient will stand for 1 hour without increased pain  Baseline: 09/23/2021 INITIAL  2 Patient will go up and down steps without increased pain  Baseline: 09/23/2021 INITIAL  3 Patient will reach an overhead shelf without pain.  Baseline: 09/23/2021 INITIAL  PLAN: PT FREQUENCY: 2x/week  PT DURATION: 6 weeks  PLANNED INTERVENTIONS: Therapeutic exercises, Therapeutic activity, Neuro Muscular re-education, Balance training, Gait training, Patient/Family education, Joint mobilization, Aquatic Therapy, Dry Needling,  Electrical stimulation, Cryotherapy, Moist heat, Taping, Ultrasound, and Manual therapy  PLAN FOR NEXT SESSION: consider manual therapy to upper traps and low back if needed. Patient more interested ine exercises in the pool and in the gym. Unable to give her exercises for her HEP 2nd to time. Consider wand flexion; table slides, ball rolling and press if able; scap retractions; LTR;    Carney Living PT DPT  08/12/2021, 5:31 PM

## 2021-08-12 ENCOUNTER — Encounter (HOSPITAL_BASED_OUTPATIENT_CLINIC_OR_DEPARTMENT_OTHER): Payer: Self-pay | Admitting: Physical Therapy

## 2021-08-13 ENCOUNTER — Other Ambulatory Visit: Payer: Self-pay

## 2021-08-13 ENCOUNTER — Ambulatory Visit (HOSPITAL_BASED_OUTPATIENT_CLINIC_OR_DEPARTMENT_OTHER): Payer: Medicare Other | Admitting: Physical Therapy

## 2021-08-13 ENCOUNTER — Encounter (HOSPITAL_BASED_OUTPATIENT_CLINIC_OR_DEPARTMENT_OTHER): Payer: Self-pay | Admitting: Physical Therapy

## 2021-08-13 DIAGNOSIS — G8929 Other chronic pain: Secondary | ICD-10-CM

## 2021-08-13 DIAGNOSIS — M545 Low back pain, unspecified: Secondary | ICD-10-CM

## 2021-08-13 DIAGNOSIS — R2689 Other abnormalities of gait and mobility: Secondary | ICD-10-CM

## 2021-08-13 DIAGNOSIS — M25552 Pain in left hip: Secondary | ICD-10-CM | POA: Diagnosis not present

## 2021-08-13 DIAGNOSIS — R29898 Other symptoms and signs involving the musculoskeletal system: Secondary | ICD-10-CM | POA: Diagnosis not present

## 2021-08-13 DIAGNOSIS — M25511 Pain in right shoulder: Secondary | ICD-10-CM | POA: Diagnosis not present

## 2021-08-13 DIAGNOSIS — M25551 Pain in right hip: Secondary | ICD-10-CM | POA: Diagnosis not present

## 2021-08-13 NOTE — Therapy (Addendum)
OUTPATIENT PHYSICAL THERAPY TREATMENT NOTE   Patient Name: Virginia Gordon MRN: [id number] DOB:Sep 27, 1950, 71 y.o., female Today's Date: 08/13/2021  PCP: Cari Caraway, MD REFERRING PROVIDER: Cari Caraway, MD   PT End of Session - 08/13/21 1906     Visit Number 2    Number of Visits 12    Date for PT Re-Evaluation 09/23/21    PT Start Time 0930    PT Stop Time 1013    PT Time Calculation (min) 43 min    Activity Tolerance Patient tolerated treatment well    Behavior During Therapy [facility] for tasks assessed/performed             Past Medical History:  Diagnosis Date   Rheumatoid arthritis ([place])    Past Surgical History:  Procedure Laterality Date   BREAST BIOPSY Left    pt not sure of date.   HIP SURGERY     SHOULDER SURGERY     Patient Active Problem List   Diagnosis Date Noted   ADD (attention deficit disorder) 11/13/2015   Connective tissue disease overlap syndrome ([place]) 11/13/2015   Menopausal and perimenopausal disorder 11/13/2015   Acne erythematosa 11/13/2015   Disordered sleep 11/13/2015   History of operative procedure on hip 07/03/2015   Left knee pain 05/20/2014   Right foot pain 05/14/2014   Bilateral shoulder pain 05/14/2014   Hypothyroidism 03/08/2014   Degenerative arthritis of hip 03/07/2012    PCP: Cari Caraway, MD   REFERRING PROVIDER: Cari Caraway, MD   REFERRING DIAG:    THERAPY DIAG:  Chronic right shoulder pain   Pain in right hip   Chronic bilateral low back pain without sciatica   Pain in left hip   Other abnormalities of gait and mobility   ONSET DATE: Several years    SUBJECTIVE:                                                                                                                                                                                            SUBJECTIVE STATEMENT: Patient went swimming yesterday. She had a some pain in her shoulder , but more in her right hip.   PERTINENT HISTORY:  Charcott  foot, Left hip replacement and revision; right hip revision, Scoliosis, DDD, Lumbar compression fracture; Right Shoulder RTC tear( repaired 2x) Rheumatoid arthritis ( although tests were negative; R foot pain;    PAIN:  Are you having pain? No 1/12 VAS scale: 5/10 over the past few days  Pain location: down the back  Pain orientation: Right  PAIN TYPE: aching Pain description: intermittent  Aggravating factors: standing and walking  Relieving factors:  rest    Are you having pain? Yes 1/12 VAS scale: 5/10 Pain location: low back  Pain orientation: Right and Left  PAIN TYPE: aching Pain description: intermittent  Aggravating factors: standing and walking  Relieving factors:rest    Are you having pain? Yes 1/12 VAS scale: 7/10 Pain location: right anterior shoulder  Pain orientation: Right  PAIN TYPE: aching Pain description: intermittent  Aggravating factors: use of the arm  Relieving factors: rest; heat, ice   PRECAUTIONS: None   WEIGHT BEARING RESTRICTIONS No   FALLS:  Has patient fallen in last 6 months? No, Number of falls:    LIVING ENVIRONMENT: 2 steps into the house  OCCUPATION:  Retired. Was working at [employer] but had to retire    PLOF: Independent with household mobility with device   PATIENT GOALS  To get rid of her cane      OBJECTIVE:      TODAY'S TREATMENT  1/12 LTR: x15 each side; reviewed technique  Supine bride 2x10 discussed progression Supine march 2x10 reviewed progression   Supine wand flexion for the shoulders x20 able to go through nearly full range   Standing shoulder extension 2x10 red  Shoulder row red 2x10   Standing hip march 2x10  Reviewed trigger points in the patients hip  Eval:  Ran out of time 2nd to multi joint assessment      PATIENT EDUCATION:  Education details: Benefits of water, Goals  Person educated: Patient Education method: Explanation, Demonstration, Tactile cues, and Verbal cues Education  comprehension: verbalized understanding, returned demonstration, verbal cues required, tactile cues required, and needs further education     HOME EXERCISE PROGRAM: Access Code: [email or web address] URL: [email or web address] Date: 08/18/2021 Prepared by: Carolyne Littles  Exercises Supine Shoulder Flexion Extension AAROM with Dowel - 1 x daily - 7 x weekly - 3 sets - 10 reps Shoulder extension with resistance - Neutral - 1 x daily - 7 x weekly - 3 sets - 10 reps Scapular Retraction with Resistance - 1 x daily - 7 x weekly - 3 sets - 10 reps Supine Shoulder Alphabet - 1 x daily - 7 x weekly - 3 sets - 10 reps Supine March - 1 x daily - 7 x weekly - 3 sets - 10 reps Supine Bridge - 1 x daily - 7 x weekly - 3 sets - 10 reps Standing March with Counter Support - 1 x daily - 7 x weekly - 3 sets - 10 reps    ASSESSMENT:   CLINICAL IMPRESSION: Patient tolerated treatmnet well today. She was given 4 exercise for the LE Ex and 3 exercises for her UE. She had no significant increase in pain. We will advance her exercises as tolerated. We will consider advancing to the gym as tolerated. She has a large trigger point in her lower back and her hip.      Objective impairments include Abnormal gait, decreased activity tolerance, decreased balance, difficulty walking, decreased ROM, decreased strength, increased fascial restrictions, increased muscle spasms, improper body mechanics, postural dysfunction, and pain. These impairments are limiting patient from cleaning, community activity, meal prep, occupation, shopping, and yard work. Personal factors including Charcott foot, Left hip replacement and revision; right hip revision, Scoliosis, DDD, Lumbar compression fracture; Right Shoulder RTC tea are also affecting patient's functional outcome. Patient will benefit from skilled PT to address above impairments and improve overall function.   REHAB POTENTIAL: Good   CLINICAL DECISION MAKING:  Evolving/moderate complexity increasing pain in several  joints   EVALUATION COMPLEXITY: Moderate     GOALS: Goals reviewed with patient? Yes   SHORT TERM GOALS:   STG Name Target Date Goal status  1 Patient will increase active shoulder flexion by 10 degrees Baseline:  09/02/2021 INITIAL  2 Patient will increase bilateral gross LE strength by 5 lbs  Baseline:  09/02/2021 INITIAL  3 Patient will be independent with basic HEP for UE/LE and low back  Baseline: 09/02/2021 INITIAL  LONG TERM GOALS:    LTG Name Target Date Goal status  1 Patient will stand for 1 hour without increased pain  Baseline: 09/23/2021 INITIAL  2 Patient will go up and down steps without increased pain  Baseline: 09/23/2021 INITIAL  3 Patient will reach an overhead shelf without pain.  Baseline: 09/23/2021 INITIAL  PLAN: PT FREQUENCY: 2x/week   PT DURATION: 6 weeks   PLANNED INTERVENTIONS: Therapeutic exercises, Therapeutic activity, Neuro Muscular re-education, Balance training, Gait training, Patient/Family education, Joint mobilization, Aquatic Therapy, Dry Needling, Electrical stimulation, Cryotherapy, Moist heat, Taping, Ultrasound, and Manual therapy   PLAN FOR NEXT SESSION: consider manual therapy to upper traps and low back if needed. Patient more interested ine exercises in the pool and in the gym. Unable to give her exercises for her HEP 2nd to time. Consider wand flexion; table slides, ball rolling and press if able; scap retractions; LTR;       Carney Living PT DPT  08/13/2021, 7:12 PM

## 2021-08-17 ENCOUNTER — Encounter: Payer: Self-pay | Admitting: Family Medicine

## 2021-08-17 ENCOUNTER — Ambulatory Visit (INDEPENDENT_AMBULATORY_CARE_PROVIDER_SITE_OTHER): Payer: Medicare Other | Admitting: Family Medicine

## 2021-08-17 VITALS — BP 126/90 | Ht 64.0 in | Wt 132.0 lb

## 2021-08-17 DIAGNOSIS — M25462 Effusion, left knee: Secondary | ICD-10-CM

## 2021-08-17 MED ORDER — METHYLPREDNISOLONE ACETATE 40 MG/ML IJ SUSP
40.0000 mg | Freq: Once | INTRAMUSCULAR | Status: AC
Start: 2021-08-17 — End: 2021-08-17
  Administered 2021-08-17: 40 mg via INTRA_ARTICULAR

## 2021-08-17 NOTE — Therapy (Signed)
OUTPATIENT PHYSICAL THERAPY TREATMENT NOTE   Patient Name: Virginia Gordon MRN: [id number] DOB:06/23/1951, 71 y.o., female Today's Date: 08/18/2021  PCP: Cari Caraway, MD REFERRING PROVIDER: Cari Caraway, MD   PT End of Session - 08/18/21 1515     Visit Number 3    Number of Visits 12    Date for PT Re-Evaluation 09/23/21    PT Start Time 1433    PT Stop Time 1511    PT Time Calculation (min) 38 min    Activity Tolerance Patient tolerated treatment well    Behavior During Therapy [facility] for tasks assessed/performed              Past Medical History:  Diagnosis Date   Rheumatoid arthritis ([place])    Past Surgical History:  Procedure Laterality Date   BREAST BIOPSY Left    pt not sure of date.   HIP SURGERY     SHOULDER SURGERY     Patient Active Problem List   Diagnosis Date Noted   Effusion of left knee joint 08/17/2021   ADD (attention deficit disorder) 11/13/2015   Connective tissue disease overlap syndrome ([place]) 11/13/2015   Menopausal and perimenopausal disorder 11/13/2015   Acne erythematosa 11/13/2015   Disordered sleep 11/13/2015   History of operative procedure on hip 07/03/2015   Left knee pain 05/20/2014   Right foot pain 05/14/2014   Bilateral shoulder pain 05/14/2014   Hypothyroidism 03/08/2014   Degenerative arthritis of hip 03/07/2012    PCP: Cari Caraway, MD   REFERRING PROVIDER: Cari Caraway, MD   REFERRING DIAG:    THERAPY DIAG:  Chronic right shoulder pain   Pain in right hip   Chronic bilateral low back pain without sciatica   Pain in left hip   Other abnormalities of gait and mobility   ONSET DATE: Several years    SUBJECTIVE:                                                                                                                                                                                            SUBJECTIVE STATEMENT: States she was reaching forward and she had shooting pain in her left low back. States she  tried her LTR and [place] and it seemed to feel better but as soon as she moved the wrong way her pain came back. States her pain comes and goes and it is not consistent except when she was trying to go to bed. Reports bridges were fatiguing and really hard.  PERTINENT HISTORY:  Charcott foot, Left hip replacement and revision; right hip revision, Scoliosis, DDD, Lumbar compression fracture; Right Shoulder RTC tear(  repaired 2x) Rheumatoid arthritis ( although tests were negative; R foot pain;    PAIN:  Are you having pain? No 1/12 VAS scale: 6/10 over the past few days  Pain location: down the back  Pain orientation: Right  PAIN TYPE: aching Pain description: intermittent  Aggravating factors: standing and walking  Relieving factors: rest    Are you having pain? Yes 1/12 VAS scale: 5/10 Pain location: low back  Pain orientation: Right and Left  PAIN TYPE: aching Pain description: intermittent  Aggravating factors: standing and walking  Relieving factors:rest    Are you having pain? Yes 1/12 VAS scale: 7/10 Pain location: right anterior shoulder  Pain orientation: Right  PAIN TYPE: aching Pain description: intermittent  Aggravating factors: use of the arm  Relieving factors: rest; heat, ice   PRECAUTIONS: None   WEIGHT BEARING RESTRICTIONS No   FALLS:  Has patient fallen in last 6 months? No, Number of falls:    LIVING ENVIRONMENT: 2 steps into the house  OCCUPATION:  Retired. Was working at [employer] but had to retire    PLOF: Independent with household mobility with device   PATIENT GOALS  To get rid of her cane      OBJECTIVE:      TODAY'S TREATMENT  08/18/21 Therapeutic Exercise:  Aerobic: Supine: rhythmic stabilization right shoulder x3 1 minutes in all directions, shoulder flexion with posterior tilt x15 Bilat Prone: prone press ups 3 minutes, cobra x1 1 minutes; cat cow in quadruped 3 minutes - tactile cues to push into flexion more on left side  and round into extension more on right side; quadruped rhythmic stabilization in all directions x3 1 minutes; child's pose - reaching with left arm x15 5" holds   Seated: seated lumbar extension - difficult to perform  Standing: hip hinge over table 8 minutes  Neuromuscular Re-education: Manual Therapy: Therapeutic Activity: Self Care: Trigger Point Dry Needling:  Modalities:     1/12 LTR: x15 each side; reviewed technique  Supine bride 2x10 discussed progression Supine march 2x10 reviewed progression   Supine wand flexion for the shoulders x20 able to go through nearly full range   Standing shoulder extension 2x10 red  Shoulder row red 2x10   Standing hip march 2x10  Reviewed trigger points in the patients hip  Eval:  Ran out of time 2nd to multi joint assessment      PATIENT EDUCATION:  Education details: Benefits of water, Goals  Person educated: Patient Education method: Explanation, Demonstration, Tactile cues, and Verbal cues Education comprehension: verbalized understanding, returned demonstration, verbal cues required, tactile cues required, and needs further education     HOME EXERCISE PROGRAM: Unable to give 2nd to time constraints   ASSESSMENT:   CLINICAL IMPRESSION: Significant improvement in lumbar pain after prone and quadruped exercises. Added quadruped exercises to HEP but no medbridge code recorded on this date, will officially add to program next session. Tolerated rhythmic stabilization of upper extremity and body well with fatigue but no pain noted. Trailed hip hinge over table with forward reach and this was very difficult and challenging for patient. Required tactile and verbal cues, will revisit this next session and review bridge exercises secondary to patient request. Will continue with current POC.   Objective impairments include Abnormal gait, decreased activity tolerance, decreased balance, difficulty walking, decreased ROM, decreased strength,  increased fascial restrictions, increased muscle spasms, improper body mechanics, postural dysfunction, and pain. These impairments are limiting patient from cleaning, community activity, meal prep,  occupation, shopping, and yard work. Personal factors including Charcott foot, Left hip replacement and revision; right hip revision, Scoliosis, DDD, Lumbar compression fracture; Right Shoulder RTC tea are also affecting patient's functional outcome. Patient will benefit from skilled PT to address above impairments and improve overall function.   REHAB POTENTIAL: Good   CLINICAL DECISION MAKING: Evolving/moderate complexity increasing pain in several joints   EVALUATION COMPLEXITY: Moderate     GOALS: Goals reviewed with patient? Yes   SHORT TERM GOALS:   STG Name Target Date Goal status  1 Patient will increase active shoulder flexion by 10 degrees Baseline:  09/02/2021 INITIAL  2 Patient will increase bilateral gross LE strength by 5 lbs  Baseline:  09/02/2021 INITIAL  3 Patient will be independent with basic HEP for UE/LE and low back  Baseline: 09/02/2021 INITIAL  LONG TERM GOALS:    LTG Name Target Date Goal status  1 Patient will stand for 1 hour without increased pain  Baseline: 09/23/2021 INITIAL  2 Patient will go up and down steps without increased pain  Baseline: 09/23/2021 INITIAL  3 Patient will reach an overhead shelf without pain.  Baseline: 09/23/2021 INITIAL  PLAN: PT FREQUENCY: 2x/week   PT DURATION: 6 weeks   PLANNED INTERVENTIONS: Therapeutic exercises, Therapeutic activity, Neuro Muscular re-education, Balance training, Gait training, Patient/Family education, Joint mobilization, Aquatic Therapy, Dry Needling, Electrical stimulation, Cryotherapy, Moist heat, Taping, Ultrasound, and Manual therapy   PLAN FOR NEXT SESSION: revisit bridge and hip hinge exercise. Get medbridge code. consider manual therapy to upper traps and low back if needed. Patient more interested ine  exercises in the pool and in the gym. Unable to give her exercises for her HEP 2nd to time. Consider wand flexion; table slides, ball rolling and press if able; scap retractions; LTR;      3:16 PM, 08/18/21 Jerene Pitch, DPT Physical Therapy with [facility]  [phone] office

## 2021-08-17 NOTE — Progress Notes (Signed)
° ° °  SUBJECTIVE:   CHIEF COMPLAINT / HPI: swelling of left knee  Chronic issue for patient.  No new injury to left knee.  Noticed increased swelling of left knee over past several weeks with some discomfort on full flexion or extension.  Denies any fevers, trauma or recent injury.  Has been going to PT. Last knee aspiration and steroid injection helped.  PERTINENT  PMH / PSH:  RA Left knee surgery   OBJECTIVE:   BP 126/90    Ht 5\' 4"  (1.626 m)    Wt 132 lb (59.9 kg)    BMI 22.66 kg/m    General: Alert, no acute distress Left Knee: Inspection: No erythrema or ecchymosis.  Notable effusion. Palpation: Ballotment test positive for effusion.  No tenderness over patella or knee joint ROM 0 - 110 degrees. Sensation intact, pulses present and well perfused Stability testing: Anterior Drawer negative Valgus/Varus test negative Posterior Drawer negative McMurrays test negative  ASSESSMENT/PLAN:   Effusion of left knee joint Ultrasound guided left knee aspiration followed by cortisone injection performed by Dr Barbaraann Barthel.  Procedure note to follow. -Follow up as needed  Carollee Leitz, MD [place]   After informed written consent timeout was performed, patient was lying supine on exam table.  Left knee was prepped with alcohol swab.  Utilizing superolateral approach, 3 mL of lidocaine was used for local anesthesia.  Then using an 18g needle on two 50cc syringes, 68 mL of clear straw-colored fluid was aspirated from left knee.  Knee was then injected with 3:1 lidocaine:depomedrol.  Patient tolerated procedure well without immediate complications

## 2021-08-17 NOTE — Assessment & Plan Note (Addendum)
Ultrasound guided left knee aspiration followed by cortisone injection performed by Dr Barbaraann Barthel.  Procedure note to follow. -Follow up as needed

## 2021-08-18 ENCOUNTER — Ambulatory Visit (HOSPITAL_BASED_OUTPATIENT_CLINIC_OR_DEPARTMENT_OTHER): Payer: Medicare Other | Admitting: Physical Therapy

## 2021-08-18 ENCOUNTER — Encounter (HOSPITAL_BASED_OUTPATIENT_CLINIC_OR_DEPARTMENT_OTHER): Payer: Self-pay | Admitting: Physical Therapy

## 2021-08-18 ENCOUNTER — Other Ambulatory Visit: Payer: Self-pay

## 2021-08-18 DIAGNOSIS — M545 Low back pain, unspecified: Secondary | ICD-10-CM | POA: Diagnosis not present

## 2021-08-18 DIAGNOSIS — M25552 Pain in left hip: Secondary | ICD-10-CM | POA: Diagnosis not present

## 2021-08-18 DIAGNOSIS — M25551 Pain in right hip: Secondary | ICD-10-CM

## 2021-08-18 DIAGNOSIS — M25511 Pain in right shoulder: Secondary | ICD-10-CM

## 2021-08-18 DIAGNOSIS — R2689 Other abnormalities of gait and mobility: Secondary | ICD-10-CM

## 2021-08-18 DIAGNOSIS — G8929 Other chronic pain: Secondary | ICD-10-CM

## 2021-08-18 DIAGNOSIS — R29898 Other symptoms and signs involving the musculoskeletal system: Secondary | ICD-10-CM | POA: Diagnosis not present

## 2021-08-20 ENCOUNTER — Encounter (HOSPITAL_BASED_OUTPATIENT_CLINIC_OR_DEPARTMENT_OTHER): Payer: Self-pay | Admitting: Physical Therapy

## 2021-08-20 ENCOUNTER — Ambulatory Visit (HOSPITAL_BASED_OUTPATIENT_CLINIC_OR_DEPARTMENT_OTHER): Payer: Medicare Other | Admitting: Physical Therapy

## 2021-08-20 ENCOUNTER — Other Ambulatory Visit: Payer: Self-pay

## 2021-08-20 DIAGNOSIS — M25511 Pain in right shoulder: Secondary | ICD-10-CM | POA: Diagnosis not present

## 2021-08-20 DIAGNOSIS — G8929 Other chronic pain: Secondary | ICD-10-CM

## 2021-08-20 DIAGNOSIS — M25552 Pain in left hip: Secondary | ICD-10-CM

## 2021-08-20 DIAGNOSIS — M25551 Pain in right hip: Secondary | ICD-10-CM

## 2021-08-20 DIAGNOSIS — M545 Low back pain, unspecified: Secondary | ICD-10-CM

## 2021-08-20 DIAGNOSIS — R29898 Other symptoms and signs involving the musculoskeletal system: Secondary | ICD-10-CM | POA: Diagnosis not present

## 2021-08-20 NOTE — Therapy (Signed)
OUTPATIENT PHYSICAL THERAPY TREATMENT NOTE   Patient Name: Virginia Gordon MRN: [id number] DOB:05-Jun-1951, 71 y.o., female Today's Date: 08/20/2021  PCP: Cari Caraway, MD REFERRING PROVIDER: Cari Caraway, MD   PT End of Session - 08/20/21 1429     Visit Number 4    Number of Visits 12    Date for PT Re-Evaluation 09/23/21    PT Start Time 1430    PT Stop Time 1510    PT Time Calculation (min) 40 min    Activity Tolerance Patient tolerated treatment well    Behavior During Therapy [facility] for tasks assessed/performed              Past Medical History:  Diagnosis Date   Rheumatoid arthritis ([place])    Past Surgical History:  Procedure Laterality Date   BREAST BIOPSY Left    pt not sure of date.   HIP SURGERY     SHOULDER SURGERY     Patient Active Problem List   Diagnosis Date Noted   Effusion of left knee joint 08/17/2021   ADD (attention deficit disorder) 11/13/2015   Connective tissue disease overlap syndrome ([place]) 11/13/2015   Menopausal and perimenopausal disorder 11/13/2015   Acne erythematosa 11/13/2015   Disordered sleep 11/13/2015   History of operative procedure on hip 07/03/2015   Left knee pain 05/20/2014   Right foot pain 05/14/2014   Bilateral shoulder pain 05/14/2014   Hypothyroidism 03/08/2014   Degenerative arthritis of hip 03/07/2012    PCP: Cari Caraway, MD   REFERRING PROVIDER: Cari Caraway, MD   REFERRING DIAG:    THERAPY DIAG:  Chronic right shoulder pain   Pain in right hip   Chronic bilateral low back pain without sciatica   Pain in left hip   Other abnormalities of gait and mobility   ONSET DATE: Several years    SUBJECTIVE:                                                                                                                                                                                            SUBJECTIVE STATEMENT: So a little sore in her back but not pain. States she went swimming yesterday. Overall her  pain is better then average. States the cat cow exercises is a wonderful  PERTINENT HISTORY:  Charcott foot, Left hip replacement and revision; right hip revision, Scoliosis, DDD, Lumbar compression fracture; Right Shoulder RTC tear( repaired 2x) Rheumatoid arthritis ( although tests were negative; R foot pain;    PAIN:  Are you having pain? No 1/12 VAS scale: 6/10 over the past few days  Pain location: down the back  Pain orientation: Right  PAIN TYPE: aching Pain description: intermittent  Aggravating factors: standing and walking  Relieving factors: rest    Are you having pain? Yes 1/12 VAS scale: 5/10 Pain location: low back  Pain orientation: Right and Left  PAIN TYPE: aching Pain description: intermittent  Aggravating factors: standing and walking  Relieving factors:rest    Are you having pain? Yes 1/12 VAS scale: 7/10 Pain location: right anterior shoulder  Pain orientation: Right  PAIN TYPE: aching Pain description: intermittent  Aggravating factors: use of the arm  Relieving factors: rest; heat, ice   PRECAUTIONS: None   WEIGHT BEARING RESTRICTIONS No   FALLS:  Has patient fallen in last 6 months? No, Number of falls:    LIVING ENVIRONMENT: 2 steps into the house  OCCUPATION:  Retired. Was working at [employer] but had to retire    PLOF: Independent with household mobility with device   PATIENT GOALS  To get rid of her cane      OBJECTIVE:      TODAY'S TREATMENT  08/20/21 Therapeutic Exercise:  Aerobic: Supine: bridge x5 - painful - stopped after foot adjustment Prone: cat cow in quadruped 3 minutes - tactile cues to push into flexion more on left side and round into extension more on right side; ; child's pose - reaching with left arm x3 60"  holds   Seated: seated lumbar extension - difficult to perform  Standing: hip hinge over table 3 minutes   Side lying: calms x15 Bilat, reverse clams PT assist left x15 5" holds B Neuromuscular  Re-education:  08/18/21 Therapeutic Exercise:  Aerobic: Supine: rhythmic stabilization right shoulder x3 1 minutes in all directions, shoulder flexion with posterior tilt x15 Bilat Prone: prone press ups 3 minutes, cobra x1 1 minutes; cat cow in quadruped 3 minutes - tactile cues to push into flexion more on left side and round into extension more on right side; quadruped rhythmic stabilization in all directions x3 1 minutes; child's pose - reaching with left arm x15 5" holds   Seated: seated lumbar extension - difficult to perform  Standing: hip hinge over table 8 minutes  Neuromuscular Re-education: Manual Therapy: Therapeutic Activity: Self Care: Trigger Point Dry Needling:  Modalities:     1/12 LTR: x15 each side; reviewed technique  Supine bride 2x10 discussed progression Supine march 2x10 reviewed progression   Supine wand flexion for the shoulders x20 able to go through nearly full range   Standing shoulder extension 2x10 red  Shoulder row red 2x10   Standing hip march 2x10  Reviewed trigger points in the patients hip  Eval:  Ran out of time 2nd to multi joint assessment      PATIENT EDUCATION:  Education details: anatomy, muscles and hip and back. Answered questions about current presentation and issues  Person educated: Patient Education method: Explanation, Demonstration, Tactile cues, and Verbal cues Education comprehension: verbalized understanding, returned demonstration, verbal cues required, tactile cues required, and needs further education     HOME EXERCISE PROGRAM: [id number]   ASSESSMENT:   CLINICAL IMPRESSION: Session focused on education, updating HEP and answering all questions about anatomy and current exercises. Reviewed new exercise per patient request. Added clams to HEP, unable to perform reverse clams on left unassisted, did not add reverse clams to HEP. Will continue with current POC  Objective impairments include Abnormal gait, decreased  activity tolerance, decreased balance, difficulty walking, decreased ROM, decreased strength, increased fascial restrictions, increased muscle spasms, improper body mechanics, postural  dysfunction, and pain. These impairments are limiting patient from cleaning, community activity, meal prep, occupation, shopping, and yard work. Personal factors including Charcott foot, Left hip replacement and revision; right hip revision, Scoliosis, DDD, Lumbar compression fracture; Right Shoulder RTC tea are also affecting patient's functional outcome. Patient will benefit from skilled PT to address above impairments and improve overall function.   REHAB POTENTIAL: Good   CLINICAL DECISION MAKING: Evolving/moderate complexity increasing pain in several joints   EVALUATION COMPLEXITY: Moderate     GOALS: Goals reviewed with patient? Yes   SHORT TERM GOALS:   STG Name Target Date Goal status  1 Patient will increase active shoulder flexion by 10 degrees Baseline:  09/02/2021 INITIAL  2 Patient will increase bilateral gross LE strength by 5 lbs  Baseline:  09/02/2021 INITIAL  3 Patient will be independent with basic HEP for UE/LE and low back  Baseline: 09/02/2021 INITIAL  LONG TERM GOALS:    LTG Name Target Date Goal status  1 Patient will stand for 1 hour without increased pain  Baseline: 09/23/2021 INITIAL  2 Patient will go up and down steps without increased pain  Baseline: 09/23/2021 INITIAL  3 Patient will reach an overhead shelf without pain.  Baseline: 09/23/2021 INITIAL  PLAN: PT FREQUENCY: 2x/week   PT DURATION: 6 weeks   PLANNED INTERVENTIONS: Therapeutic exercises, Therapeutic activity, Neuro Muscular re-education, Balance training, Gait training, Patient/Family education, Joint mobilization, Aquatic Therapy, Dry Needling, Electrical stimulation, Cryotherapy, Moist heat, Taping, Ultrasound, and Manual therapy   PLAN FOR NEXT SESSION: revisit hip hinge exercise. Get medbridge code. consider  manual therapy to upper traps and low back if needed. Patient more interested ine exercises in the pool and in the gym. Unable to give her exercises for her HEP 2nd to time. Consider wand flexion; table slides, ball rolling and press if able; scap retractions; LTR;      3:13 PM, 08/20/21 Jerene Pitch, DPT Physical Therapy with [facility]  [phone] office

## 2021-08-25 ENCOUNTER — Other Ambulatory Visit: Payer: Self-pay

## 2021-08-25 ENCOUNTER — Encounter (HOSPITAL_BASED_OUTPATIENT_CLINIC_OR_DEPARTMENT_OTHER): Payer: Self-pay | Admitting: Physical Therapy

## 2021-08-25 ENCOUNTER — Ambulatory Visit (HOSPITAL_BASED_OUTPATIENT_CLINIC_OR_DEPARTMENT_OTHER): Payer: Medicare Other | Admitting: Physical Therapy

## 2021-08-25 DIAGNOSIS — M25551 Pain in right hip: Secondary | ICD-10-CM | POA: Diagnosis not present

## 2021-08-25 DIAGNOSIS — M25511 Pain in right shoulder: Secondary | ICD-10-CM | POA: Diagnosis not present

## 2021-08-25 DIAGNOSIS — M25552 Pain in left hip: Secondary | ICD-10-CM

## 2021-08-25 DIAGNOSIS — G8929 Other chronic pain: Secondary | ICD-10-CM | POA: Diagnosis not present

## 2021-08-25 DIAGNOSIS — R2689 Other abnormalities of gait and mobility: Secondary | ICD-10-CM

## 2021-08-25 DIAGNOSIS — M545 Low back pain, unspecified: Secondary | ICD-10-CM | POA: Diagnosis not present

## 2021-08-25 DIAGNOSIS — R29898 Other symptoms and signs involving the musculoskeletal system: Secondary | ICD-10-CM | POA: Diagnosis not present

## 2021-08-25 NOTE — Therapy (Signed)
OUTPATIENT PHYSICAL THERAPY TREATMENT NOTE   Patient Name: Virginia Gordon MRN: [id number] DOB:Feb 23, 1951, 71 y.o., female Today's Date: 08/25/2021  PCP: Cari Caraway, MD REFERRING PROVIDER: Cari Caraway, MD   PT End of Session - 08/25/21 1403     Visit Number 5    Number of Visits 12    Date for PT Re-Evaluation 09/23/21    PT Start Time 0930    PT Stop Time 1012    PT Time Calculation (min) 42 min    Activity Tolerance Patient tolerated treatment well    Behavior During Therapy [facility] for tasks assessed/performed               Past Medical History:  Diagnosis Date   Rheumatoid arthritis ([place])    Past Surgical History:  Procedure Laterality Date   BREAST BIOPSY Left    pt not sure of date.   HIP SURGERY     SHOULDER SURGERY     Patient Active Problem List   Diagnosis Date Noted   Effusion of left knee joint 08/17/2021   ADD (attention deficit disorder) 11/13/2015   Connective tissue disease overlap syndrome ([place]) 11/13/2015   Menopausal and perimenopausal disorder 11/13/2015   Acne erythematosa 11/13/2015   Disordered sleep 11/13/2015   History of operative procedure on hip 07/03/2015   Left knee pain 05/20/2014   Right foot pain 05/14/2014   Bilateral shoulder pain 05/14/2014   Hypothyroidism 03/08/2014   Degenerative arthritis of hip 03/07/2012    PCP: Cari Caraway, MD   REFERRING PROVIDER: Cari Caraway, MD   REFERRING DIAG:    THERAPY DIAG:  Chronic right shoulder pain   Pain in right hip   Chronic bilateral low back pain without sciatica   Pain in left hip   Other abnormalities of gait and mobility   ONSET DATE: Several years    SUBJECTIVE:                                                                                                                                                                                            SUBJECTIVE STATEMENT: So a little sore in her back but not pain. States she went swimming yesterday. Overall her  pain is better then average. States the cat cow exercises is a wonderful  PERTINENT HISTORY:  Charcott foot, Left hip replacement and revision; right hip revision, Scoliosis, DDD, Lumbar compression fracture; Right Shoulder RTC tear( repaired 2x) Rheumatoid arthritis ( although tests were negative; R foot pain;    PAIN:  Are you having pain? No 1/12 VAS scale: 6/10 over the past few days  Pain location: down the back  Pain orientation: Right  PAIN TYPE: aching Pain description: intermittent  Aggravating factors: standing and walking  Relieving factors: rest    Are you having pain? Yes 1/12 VAS scale: 5/10 Pain location: low back  Pain orientation: Right and Left  PAIN TYPE: aching Pain description: intermittent  Aggravating factors: standing and walking  Relieving factors:rest    Are you having pain? Yes 1/12 VAS scale: 7/10 Pain location: right anterior shoulder  Pain orientation: Right  PAIN TYPE: aching Pain description: intermittent  Aggravating factors: use of the arm  Relieving factors: rest; heat, ice   PRECAUTIONS: None   WEIGHT BEARING RESTRICTIONS No   FALLS:  Has patient fallen in last 6 months? No, Number of falls:    LIVING ENVIRONMENT: 2 steps into the house  OCCUPATION:  Retired. Was working at [employer] but had to retire    PLOF: Independent with household mobility with device   PATIENT GOALS  To get rid of her cane      OBJECTIVE:      TODAY'S TREATMENT  1/24 Standing hip 3 way 2lbs x10 each spent time working on technique of the exercises. She can feel it much more when she is stabilizing on the left.  Supine wand fleion x10 1lb x10 2lbs  Supine ABC with weight 2x10 2lbs   Standing cable row 10lbs 3x10  Cable shoulder extension 3x10 10 lbs    08/20/21 Therapeutic Exercise:  Aerobic: Supine: bridge x5 - painful - stopped after foot adjustment Prone: cat cow in quadruped 3 minutes - tactile cues to push into flexion more on  left side and round into extension more on right side; ; child's pose - reaching with left arm x3 60"  holds   Seated: seated lumbar extension - difficult to perform  Standing: hip hinge over table 3 minutes   Side lying: calms x15 Bilat, reverse clams PT assist left x15 5" holds B Neuromuscular Re-education:  08/18/21 Therapeutic Exercise:  Aerobic: Supine: rhythmic stabilization right shoulder x3 1 minutes in all directions, shoulder flexion with posterior tilt x15 Bilat Prone: prone press ups 3 minutes, cobra x1 1 minutes; cat cow in quadruped 3 minutes - tactile cues to push into flexion more on left side and round into extension more on right side; quadruped rhythmic stabilization in all directions x3 1 minutes; child's pose - reaching with left arm x15 5" holds   Seated: seated lumbar extension - difficult to perform  Standing: hip hinge over table 8 minutes  Neuromuscular Re-education: Manual Therapy: Therapeutic Activity: Self Care: Trigger Point Dry Needling:  Modalities:     1/12 LTR: x15 each side; reviewed technique  Supine bride 2x10 discussed progression Supine march 2x10 reviewed progression   Supine wand flexion for the shoulders x20 able to go through nearly full range   Standing shoulder extension 2x10 red  Shoulder row red 2x10   Standing hip march 2x10  Reviewed trigger points in the patients hip  Eval:  Ran out of time 2nd to multi joint assessment      PATIENT EDUCATION:  Education details: anatomy, muscles and hip and back. Answered questions about current presentation and issues  Person educated: Patient Education method: Explanation, Demonstration, Tactile cues, and Verbal cues Education comprehension: verbalized understanding, returned demonstration, verbal cues required, tactile cues required, and needs further education     HOME EXERCISE PROGRAM: [id number]   ASSESSMENT:   CLINICAL IMPRESSION: The patient tolerated treatment well. She had  some difficulty with standing  hip exercises   Objective impairments include Abnormal gait, decreased activity tolerance, decreased balance, difficulty walking, decreased ROM, decreased strength, increased fascial restrictions, increased muscle spasms, improper body mechanics, postural dysfunction, and pain. These impairments are limiting patient from cleaning, community activity, meal prep, occupation, shopping, and yard work. Personal factors including Charcott foot, Left hip replacement and revision; right hip revision, Scoliosis, DDD, Lumbar compression fracture; Right Shoulder RTC tea are also affecting patient's functional outcome. Patient will benefit from skilled PT to address above impairments and improve overall function.   REHAB POTENTIAL: Good   CLINICAL DECISION MAKING: Evolving/moderate complexity increasing pain in several joints   EVALUATION COMPLEXITY: Moderate     GOALS: Goals reviewed with patient? Yes   SHORT TERM GOALS:   STG Name Target Date Goal status  1 Patient will increase active shoulder flexion by 10 degrees Baseline:  09/02/2021 INITIAL  2 Patient will increase bilateral gross LE strength by 5 lbs  Baseline:  09/02/2021 INITIAL  3 Patient will be independent with basic HEP for UE/LE and low back  Baseline: 09/02/2021 INITIAL  LONG TERM GOALS:    LTG Name Target Date Goal status  1 Patient will stand for 1 hour without increased pain  Baseline: 09/23/2021 INITIAL  2 Patient will go up and down steps without increased pain  Baseline: 09/23/2021 INITIAL  3 Patient will reach an overhead shelf without pain.  Baseline: 09/23/2021 INITIAL  PLAN: PT FREQUENCY: 2x/week   PT DURATION: 6 weeks   PLANNED INTERVENTIONS: Therapeutic exercises, Therapeutic activity, Neuro Muscular re-education, Balance training, Gait training, Patient/Family education, Joint mobilization, Aquatic Therapy, Dry Needling, Electrical stimulation, Cryotherapy, Moist heat, Taping, Ultrasound, and  Manual therapy   PLAN FOR NEXT SESSION: revisit hip hinge exercise. Get medbridge code. consider manual therapy to upper traps and low back if needed. Patient more interested ine exercises in the pool and in the gym. Unable to give her exercises for her HEP 2nd to time. Consider wand flexion; table slides, ball rolling and press if able; scap retractions; LTR;      2:05 PM, 08/25/21 Jerene Pitch, DPT Physical Therapy with [facility]  [phone] office

## 2021-08-31 ENCOUNTER — Other Ambulatory Visit: Payer: Self-pay

## 2021-08-31 ENCOUNTER — Ambulatory Visit (HOSPITAL_BASED_OUTPATIENT_CLINIC_OR_DEPARTMENT_OTHER): Payer: Medicare Other | Admitting: Physical Therapy

## 2021-08-31 ENCOUNTER — Encounter (HOSPITAL_BASED_OUTPATIENT_CLINIC_OR_DEPARTMENT_OTHER): Payer: Self-pay | Admitting: Physical Therapy

## 2021-08-31 DIAGNOSIS — G8929 Other chronic pain: Secondary | ICD-10-CM | POA: Diagnosis not present

## 2021-08-31 DIAGNOSIS — M25551 Pain in right hip: Secondary | ICD-10-CM

## 2021-08-31 DIAGNOSIS — M25552 Pain in left hip: Secondary | ICD-10-CM

## 2021-08-31 DIAGNOSIS — M545 Low back pain, unspecified: Secondary | ICD-10-CM | POA: Diagnosis not present

## 2021-08-31 DIAGNOSIS — R29898 Other symptoms and signs involving the musculoskeletal system: Secondary | ICD-10-CM | POA: Diagnosis not present

## 2021-08-31 DIAGNOSIS — M25511 Pain in right shoulder: Secondary | ICD-10-CM | POA: Diagnosis not present

## 2021-08-31 DIAGNOSIS — R2689 Other abnormalities of gait and mobility: Secondary | ICD-10-CM

## 2021-08-31 NOTE — Therapy (Signed)
OUTPATIENT PHYSICAL THERAPY TREATMENT NOTE   Patient Name: Virginia Gordon MRN: [id number] DOB:July 04, 1951, 71 y.o., female Today's Date: 08/31/2021  PCP: Cari Caraway, MD REFERRING PROVIDER: Cari Caraway, MD   PT End of Session - 08/31/21 1203     Visit Number 6    Number of Visits 12    Date for PT Re-Evaluation 09/23/21    PT Start Time 1203    PT Stop Time 1245    PT Time Calculation (min) 42 min    Activity Tolerance Patient tolerated treatment well    Behavior During Therapy [facility] for tasks assessed/performed               Past Medical History:  Diagnosis Date   Rheumatoid arthritis ([place])    Past Surgical History:  Procedure Laterality Date   BREAST BIOPSY Left    pt not sure of date.   HIP SURGERY     SHOULDER SURGERY     Patient Active Problem List   Diagnosis Date Noted   Effusion of left knee joint 08/17/2021   ADD (attention deficit disorder) 11/13/2015   Connective tissue disease overlap syndrome ([place]) 11/13/2015   Menopausal and perimenopausal disorder 11/13/2015   Acne erythematosa 11/13/2015   Disordered sleep 11/13/2015   History of operative procedure on hip 07/03/2015   Left knee pain 05/20/2014   Right foot pain 05/14/2014   Bilateral shoulder pain 05/14/2014   Hypothyroidism 03/08/2014   Degenerative arthritis of hip 03/07/2012    PCP: Cari Caraway, MD   REFERRING PROVIDER: Cari Caraway, MD   REFERRING DIAG:    THERAPY DIAG:  Chronic right shoulder pain   Pain in right hip   Chronic bilateral low back pain without sciatica   Pain in left hip   Other abnormalities of gait and mobility   ONSET DATE: Several years    SUBJECTIVE:                                                                                                                                                                                            SUBJECTIVE STATEMENT: Pt reports going to gym a few days ago worked on some machines.  She states she feels she  may have overdone the add/abd machine. Since then her hips and buttocks have been hurtiing her.  PERTINENT HISTORY:  Charcott foot, Left hip replacement and revision; right hip revision, Scoliosis, DDD, Lumbar compression fracture; Right Shoulder RTC tear( repaired 2x) Rheumatoid arthritis ( although tests were negative; R foot pain;    PAIN:  Are you having pain? No 1/12 VAS scale: 6/10 over the past few days  Pain location: down the back  Pain orientation: Right  PAIN TYPE: aching Pain description: intermittent  Aggravating factors: standing and walking  Relieving factors: rest    Are you having pain? Yes 1/12 VAS scale: 5/10 Pain location: low back  Pain orientation: Right and Left  PAIN TYPE: aching Pain description: intermittent  Aggravating factors: standing and walking  Relieving factors:rest    Are you having pain? Yes 1/12 VAS scale: 7/10 Pain location: right anterior shoulder  Pain orientation: Right  PAIN TYPE: aching Pain description: intermittent  Aggravating factors: use of the arm  Relieving factors: rest; heat, ice   PRECAUTIONS: None   WEIGHT BEARING RESTRICTIONS No   FALLS:  Has patient fallen in last 6 months? No, Number of falls:    LIVING ENVIRONMENT: 2 steps into the house  OCCUPATION:  Retired. Was working at [employer] but had to retire    PLOF: Independent with household mobility with device   PATIENT GOALS  To get rid of her cane      OBJECTIVE:      TODAY'S TREATMENT  1/30  Pt seen for aquatic therapy today.  Treatment took place in water 3.25-4.8 ft in depth at the [facility] pool. Temp of water was 91.  Pt entered/exited the pool via stairs (step through pattern) independently with bilat rail. Reviewed current function, pain levels, response to prior Rx, and HEP compliance.   Introduction to setting   Walking forward backward and side stepping multiple widths. Pt requires yellow hand buoys for balance  support. Seated stretching Kick board push downs 3x15  Lumbar rotation 2 x 10  Resisted lateral rotation with kick board not tolerated  Hip hinges 2x7  Step ups on bottom water step R/L 2x10 decreasing ue support to unsupported.Cues for core control and balance.  SL step up. Cues for eccentric and concentric control. x10     Pt requires buoyancy for support and to offload joints with strengthening exercises. Viscosity of the water is needed for resistance of strengthening; water current perturbations provides challenge to standing balance unsupported, requiring increased core activation. .   11/24 Standing hip 3 way 2lbs x10 each spent time working on technique of the exercises. She can feel it much more when she is stabilizing on the left.  Supine wand fleion x10 1lb x10 2lbs  Supine ABC with weight 2x10 2lbs   Standing cable row 10lbs 3x10  Cable shoulder extension 3x10 10 lbs    08/20/21 Therapeutic Exercise:  Aerobic: Supine: bridge x5 - painful - stopped after foot adjustment Prone: cat cow in quadruped 3 minutes - tactile cues to push into flexion more on left side and round into extension more on right side; ; child's pose - reaching with left arm x3 60"  holds   Seated: seated lumbar extension - difficult to perform  Standing: hip hinge over table 3 minutes   Side lying: calms x15 Bilat, reverse clams PT assist left x15 5" holds B Neuromuscular Re-education:  08/18/21 Therapeutic Exercise:  Aerobic: Supine: rhythmic stabilization right shoulder x3 1 minutes in all directions, shoulder flexion with posterior tilt x15 Bilat Prone: prone press ups 3 minutes, cobra x1 1 minutes; cat cow in quadruped 3 minutes - tactile cues to push into flexion more on left side and round into extension more on right side; quadruped rhythmic stabilization in all directions x3 1 minutes; child's pose - reaching with left arm x15 5" holds   Seated: seated lumbar extension -  difficult to  perform  Standing: hip hinge over table 8 minutes  Neuromuscular Re-education: Manual Therapy: Therapeutic Activity: Self Care: Trigger Point Dry Needling:  Modalities:     1/12 LTR: x15 each side; reviewed technique  Supine bride 2x10 discussed progression Supine march 2x10 reviewed progression   Supine wand flexion for the shoulders x20 able to go through nearly full range   Standing shoulder extension 2x10 red  Shoulder row red 2x10   Standing hip march 2x10  Reviewed trigger points in the patients hip  Eval:  Ran out of time 2nd to multi joint assessment      PATIENT EDUCATION:  Education details: anatomy, muscles and hip and back. Answered questions about current presentation and issues  Person educated: Patient Education method: Explanation, Demonstration, Tactile cues, and Verbal cues Education comprehension: verbalized understanding, returned demonstration, verbal cues required, tactile cues required, and needs further education     HOME EXERCISE PROGRAM: [id number]   ASSESSMENT:   CLINICAL IMPRESSION: Pt introduced to setting.  She demonstrates indep, confidence and safety. 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 Pt reports discomfort in proximal hamstrings and add with forward amb in pool.  Has difficulty maintaining standing balance. Says she has been uncomfortable all week end.  She is limited throughout session due to pain.     Objective impairments include Abnormal gait, decreased activity tolerance, decreased balance, difficulty walking, decreased ROM, decreased strength, increased fascial restrictions, increased muscle spasms, improper body mechanics, postural dysfunction, and pain. These impairments are limiting patient from cleaning, community activity, meal prep, occupation, shopping, and yard work. Personal factors including Charcott foot, Left hip replacement and revision; right hip revision, Scoliosis, DDD, Lumbar  compression fracture; Right Shoulder RTC tea are also affecting patient's functional outcome. Patient will benefit from skilled PT to address above impairments and improve overall function.   REHAB POTENTIAL: Good   CLINICAL DECISION MAKING: Evolving/moderate complexity increasing pain in several joints   EVALUATION COMPLEXITY: Moderate     GOALS: Goals reviewed with patient? Yes   SHORT TERM GOALS:   STG Name Target Date Goal status  1 Patient will increase active shoulder flexion by 10 degrees Baseline:  09/02/2021 INITIAL  2 Patient will increase bilateral gross LE strength by 5 lbs  Baseline:  09/02/2021 INITIAL  3 Patient will be independent with basic HEP for UE/LE and low back  Baseline: 09/02/2021 INITIAL  LONG TERM GOALS:    LTG Name Target Date Goal status  1 Patient will stand for 1 hour without increased pain  Baseline: 09/23/2021 INITIAL  2 Patient will go up and down steps without increased pain  Baseline: 09/23/2021 INITIAL  3 Patient will reach an overhead shelf without pain.  Baseline: 09/23/2021 INITIAL  PLAN: PT FREQUENCY: 2x/week   PT DURATION: 6 weeks   PLANNED INTERVENTIONS: Therapeutic exercises, Therapeutic activity, Neuro Muscular re-education, Balance training, Gait training, Patient/Family education, Joint mobilization, Aquatic Therapy, Dry Needling, Electrical stimulation, Cryotherapy, Moist heat, Taping, Ultrasound, and Manual therapy   PLAN FOR NEXT SESSION: revisit hip hinge exercise. Get medbridge code. consider manual therapy to upper traps and low back if needed. Patient more interested ine exercises in the pool and in the gym. Unable to give her exercises for her HEP 2nd to time. Consider wand flexion; table slides, ball rolling and press if able; scap retractions; LTR;     Annamarie Major) Raylin Winer MPT 1:17 PM, 08/31/21

## 2021-09-01 ENCOUNTER — Encounter (HOSPITAL_BASED_OUTPATIENT_CLINIC_OR_DEPARTMENT_OTHER): Payer: Medicare Other | Admitting: Physical Therapy

## 2021-09-02 DIAGNOSIS — M47816 Spondylosis without myelopathy or radiculopathy, lumbar region: Secondary | ICD-10-CM | POA: Diagnosis not present

## 2021-09-02 DIAGNOSIS — M5416 Radiculopathy, lumbar region: Secondary | ICD-10-CM | POA: Diagnosis not present

## 2021-09-03 ENCOUNTER — Ambulatory Visit (HOSPITAL_BASED_OUTPATIENT_CLINIC_OR_DEPARTMENT_OTHER): Payer: Medicare Other | Admitting: Physical Therapy

## 2021-09-04 ENCOUNTER — Other Ambulatory Visit: Payer: Self-pay

## 2021-09-04 ENCOUNTER — Ambulatory Visit (HOSPITAL_BASED_OUTPATIENT_CLINIC_OR_DEPARTMENT_OTHER): Payer: Medicare Other | Attending: Family Medicine | Admitting: Physical Therapy

## 2021-09-04 ENCOUNTER — Encounter (HOSPITAL_BASED_OUTPATIENT_CLINIC_OR_DEPARTMENT_OTHER): Payer: Self-pay | Admitting: Physical Therapy

## 2021-09-04 DIAGNOSIS — G8929 Other chronic pain: Secondary | ICD-10-CM | POA: Diagnosis not present

## 2021-09-04 DIAGNOSIS — M25551 Pain in right hip: Secondary | ICD-10-CM | POA: Insufficient documentation

## 2021-09-04 DIAGNOSIS — M25552 Pain in left hip: Secondary | ICD-10-CM | POA: Diagnosis not present

## 2021-09-04 DIAGNOSIS — M25511 Pain in right shoulder: Secondary | ICD-10-CM | POA: Diagnosis not present

## 2021-09-04 DIAGNOSIS — R2689 Other abnormalities of gait and mobility: Secondary | ICD-10-CM | POA: Insufficient documentation

## 2021-09-04 DIAGNOSIS — M545 Low back pain, unspecified: Secondary | ICD-10-CM | POA: Diagnosis not present

## 2021-09-04 DIAGNOSIS — Z20822 Contact with and (suspected) exposure to covid-19: Secondary | ICD-10-CM | POA: Diagnosis not present

## 2021-09-04 NOTE — Therapy (Signed)
OUTPATIENT PHYSICAL THERAPY TREATMENT NOTE   Patient Name: Virginia Gordon MRN: [id number] DOB:1951-01-12, 71 y.o., female Today's Date: 09/04/2021  PCP: Cari Caraway, MD REFERRING PROVIDER: Cari Caraway, MD   PT End of Session - 09/04/21 1351     Visit Number 7    Number of Visits 12    Date for PT Re-Evaluation 09/23/21    PT Start Time 1300    PT Stop Time 1343    PT Time Calculation (min) 43 min    Activity Tolerance Patient tolerated treatment well    Behavior During Therapy [facility] for tasks assessed/performed               Past Medical History:  Diagnosis Date   Rheumatoid arthritis ([place])    Past Surgical History:  Procedure Laterality Date   BREAST BIOPSY Left    pt not sure of date.   HIP SURGERY     SHOULDER SURGERY     Patient Active Problem List   Diagnosis Date Noted   Effusion of left knee joint 08/17/2021   ADD (attention deficit disorder) 11/13/2015   Connective tissue disease overlap syndrome ([place]) 11/13/2015   Menopausal and perimenopausal disorder 11/13/2015   Acne erythematosa 11/13/2015   Disordered sleep 11/13/2015   History of operative procedure on hip 07/03/2015   Left knee pain 05/20/2014   Right foot pain 05/14/2014   Bilateral shoulder pain 05/14/2014   Hypothyroidism 03/08/2014   Degenerative arthritis of hip 03/07/2012    PCP: Cari Caraway, MD   REFERRING PROVIDER: Cari Caraway, MD   REFERRING DIAG:    THERAPY DIAG:  Chronic right shoulder pain   Pain in right hip   Chronic bilateral low back pain without sciatica   Pain in left hip   Other abnormalities of gait and mobility   ONSET DATE: Several years    SUBJECTIVE:                                                                                                                                                                                            SUBJECTIVE STATEMENT: Patient is feeling better now compared to the last visit. She found a lot of benefit from  the last pool session. She had an injection on Wednesday and had improved pain in her back.  PERTINENT HISTORY:  Charcott foot, Left hip replacement and revision; right hip revision, Scoliosis, DDD, Lumbar compression fracture; Right Shoulder RTC tear( repaired 2x) Rheumatoid arthritis ( although tests were negative; R foot pain;    PAIN:  Are you having pain? No 2/3 VAS scale: 0/10 over the past few days  Pain  location: down the back  Pain orientation: Right  PAIN TYPE: aching Pain description: intermittent  Aggravating factors: standing and walking  Relieving factors: rest    Are you having pain? Yes 2/3 VAS scale: no pain after the injection  Pain location: low back  Pain orientation: Right and Left  PAIN TYPE: aching Pain description: intermittent  Aggravating factors: standing and walking  Relieving factors:rest    Are you having pain? Yes 1/12 VAS scale: 7/10 Pain location: right anterior shoulder  Pain orientation: Right  PAIN TYPE: aching Pain description: intermittent  Aggravating factors: use of the arm  Relieving factors: rest; heat, ice   PRECAUTIONS: None   WEIGHT BEARING RESTRICTIONS No   FALLS:  Has patient fallen in last 6 months? No, Number of falls:    LIVING ENVIRONMENT: 2 steps into the house  OCCUPATION:  Retired. Was working at [employer] but had to retire    PLOF: Independent with household mobility with device   PATIENT GOALS  To get rid of her cane      OBJECTIVE:      TODAY'S TREATMENT   2/3 Pt seen for aquatic therapy today.  Treatment took place in water 3.25-4.8 ft in depth at the [facility] pool. Temp of water was 91.  Pt entered/exited the pool via stairs (step through pattern) independently with bilat rail. Reviewed current function, pain levels, response to prior Rx, and HEP compliance.   Introduction to setting   Walking forward backward and side stepping multiple widths. Pt requires yellow hand buoys for  balance support. Seated stretching Kick board push downs 3x15 Kick board traction stretch 5 x 10 sec hold Lateral traction 3x20 sec hold  Rainbow weights extension x10  Extension with speed x20   Side to side x10  Quick side to side 2x10  Hip abduction x10 and x10 weight speed  Hip circles x10 each direction each leg   Hip hinge x20         Pt requires buoyancy for support and to offload joints with strengthening exercises. Viscosity of the water is needed for resistance of strengthening; water current perturbations provides challenge to standing balance unsupported, requiring increased core activation. .  1/30  Pt seen for aquatic therapy today.  Treatment took place in water 3.25-4.8 ft in depth at the [facility] pool. Temp of water was 91.  Pt entered/exited the pool via stairs (step through pattern) independently with bilat rail. Reviewed current function, pain levels, response to prior Rx, and HEP compliance.   Introduction to setting   Walking forward backward and side stepping multiple widths. Pt requires yellow hand buoys for balance support. Seated stretching Kick board push downs 3x15  Lumbar rotation 2 x 10  Resisted lateral rotation with kick board not tolerated  Hip hinges 2x7  Step ups on bottom water step R/L 2x10 decreasing ue support to unsupported.Cues for core control and balance.  SL step up. Cues for eccentric and concentric control. x10     Pt requires buoyancy for support and to offload joints with strengthening exercises. Viscosity of the water is needed for resistance of strengthening; water current perturbations provides challenge to standing balance unsupported, requiring increased core activation. .   11/24 Standing hip 3 way 2lbs x10 each spent time working on technique of the exercises. She can feel it much more when she is stabilizing on the left.  Supine wand fleion x10 1lb x10 2lbs  Supine ABC with weight 2x10 2lbs  Standing cable row 10lbs 3x10  Cable shoulder extension 3x10 10 lbs    08/20/21 Therapeutic Exercise:  Aerobic: Supine: bridge x5 - painful - stopped after foot adjustment Prone: cat cow in quadruped 3 minutes - tactile cues to push into flexion more on left side and round into extension more on right side; ; child's pose - reaching with left arm x3 60"  holds   Seated: seated lumbar extension - difficult to perform  Standing: hip hinge over table 3 minutes   Side lying: calms x15 Bilat, reverse clams PT assist left x15 5" holds B Neuromuscular Re-education:  08/18/21 Therapeutic Exercise:  Aerobic: Supine: rhythmic stabilization right shoulder x3 1 minutes in all directions, shoulder flexion with posterior tilt x15 Bilat Prone: prone press ups 3 minutes, cobra x1 1 minutes; cat cow in quadruped 3 minutes - tactile cues to push into flexion more on left side and round into extension more on right side; quadruped rhythmic stabilization in all directions x3 1 minutes; child's pose - reaching with left arm x15 5" holds   Seated: seated lumbar extension - difficult to perform  Standing: hip hinge over table 8 minutes  Neuromuscular Re-education: Manual Therapy: Therapeutic Activity: Self Care: Trigger Point Dry Needling:  Modalities:    PATIENT EDUCATION:  Education details: anatomy, muscles and hip and back. Answered questions about current presentation and issues  Person educated: Patient Education method: Explanation, Demonstration, Tactile cues, and Verbal cues Education comprehension: verbalized understanding, returned demonstration, verbal cues required, tactile cues required, and needs further education     HOME EXERCISE PROGRAM: [id number]   ASSESSMENT:   CLINICAL IMPRESSION: The patient tolerated treatment well. We reviewed pillaties conscepts for her hips and UE. She had no increase in pain but she did feel some fatigue. We continue to emphasize posture with her  exercises. We will work on single leg stance balance exercises next visit in the water. She was advised that normal response to exercises is soreness for 24-48. After her last gym session it lasted longer then that.    Objective impairments include Abnormal gait, decreased activity tolerance, decreased balance, difficulty walking, decreased ROM, decreased strength, increased fascial restrictions, increased muscle spasms, improper body mechanics, postural dysfunction, and pain. These impairments are limiting patient from cleaning, community activity, meal prep, occupation, shopping, and yard work. Personal factors including Charcott foot, Left hip replacement and revision; right hip revision, Scoliosis, DDD, Lumbar compression fracture; Right Shoulder RTC tea are also affecting patient's functional outcome. Patient will benefit from skilled PT to address above impairments and improve overall function.   REHAB POTENTIAL: Good   CLINICAL DECISION MAKING: Evolving/moderate complexity increasing pain in several joints   EVALUATION COMPLEXITY: Moderate     GOALS: Goals reviewed with patient? Yes   SHORT TERM GOALS:   STG Name Target Date Goal status  1 Patient will increase active shoulder flexion by 10 degrees Baseline:  09/02/2021 INITIAL  2 Patient will increase bilateral gross LE strength by 5 lbs  Baseline:  09/02/2021 INITIAL  3 Patient will be independent with basic HEP for UE/LE and low back  Baseline: 09/02/2021 INITIAL  LONG TERM GOALS:    LTG Name Target Date Goal status  1 Patient will stand for 1 hour without increased pain  Baseline: 09/23/2021 INITIAL  2 Patient will go up and down steps without increased pain  Baseline: 09/23/2021 INITIAL  3 Patient will reach an overhead shelf without pain.  Baseline: 09/23/2021 INITIAL  PLAN: PT FREQUENCY: 2x/week  PT DURATION: 6 weeks   PLANNED INTERVENTIONS: Therapeutic exercises, Therapeutic activity, Neuro Muscular re-education, Balance  training, Gait training, Patient/Family education, Joint mobilization, Aquatic Therapy, Dry Needling, Electrical stimulation, Cryotherapy, Moist heat, Taping, Ultrasound, and Manual therapy   PLAN FOR NEXT SESSION: revisit hip hinge exercise. Get medbridge code. consider manual therapy to upper traps and low back if needed. Patient more interested ine exercises in the pool and in the gym. Unable to give her exercises for her HEP 2nd to time. Consider wand flexion; table slides, ball rolling and press if able; scap retractions; LTR;     Carolyne Littles PT DPT  1:55 PM, 09/04/21

## 2021-09-07 ENCOUNTER — Encounter (HOSPITAL_BASED_OUTPATIENT_CLINIC_OR_DEPARTMENT_OTHER): Payer: Self-pay | Admitting: Physical Therapy

## 2021-09-07 ENCOUNTER — Other Ambulatory Visit: Payer: Self-pay

## 2021-09-07 ENCOUNTER — Ambulatory Visit (HOSPITAL_BASED_OUTPATIENT_CLINIC_OR_DEPARTMENT_OTHER): Payer: Medicare Other | Admitting: Physical Therapy

## 2021-09-07 DIAGNOSIS — G8929 Other chronic pain: Secondary | ICD-10-CM | POA: Diagnosis not present

## 2021-09-07 DIAGNOSIS — M25551 Pain in right hip: Secondary | ICD-10-CM

## 2021-09-07 DIAGNOSIS — M25552 Pain in left hip: Secondary | ICD-10-CM

## 2021-09-07 DIAGNOSIS — R2689 Other abnormalities of gait and mobility: Secondary | ICD-10-CM

## 2021-09-07 DIAGNOSIS — M25511 Pain in right shoulder: Secondary | ICD-10-CM | POA: Diagnosis not present

## 2021-09-07 DIAGNOSIS — M545 Low back pain, unspecified: Secondary | ICD-10-CM | POA: Diagnosis not present

## 2021-09-07 NOTE — Therapy (Signed)
OUTPATIENT PHYSICAL THERAPY TREATMENT NOTE  Progress Note Reporting Period  08/11/2021 to 09/07/2021  See note below for Objective Data and Assessment of Progress/Goals.       Patient Name: Virginia Gordon MRN: [id number] DOB:07/14/1951, 71 y.o., female Today's Date: 09/07/2021  PCP: Cari Caraway, MD REFERRING PROVIDER: Cari Caraway, MD   PT End of Session - 09/07/21 1150     Visit Number 8    Number of Visits 20    Date for PT Re-Evaluation 10/19/21    Authorization Type [employer] progress note done at visit 8 next to be done at 37.    PT Start Time 1145    PT Stop Time 1236    PT Time Calculation (min) 51 min    Activity Tolerance Patient tolerated treatment well    Behavior During Therapy WFL for tasks assessed/performed               Past Medical History:  Diagnosis Date   Rheumatoid arthritis ([place])    Past Surgical History:  Procedure Laterality Date   BREAST BIOPSY Left    pt not sure of date.   HIP SURGERY     SHOULDER SURGERY     Patient Active Problem List   Diagnosis Date Noted   Effusion of left knee joint 08/17/2021   ADD (attention deficit disorder) 11/13/2015   Connective tissue disease overlap syndrome ([place]) 11/13/2015   Menopausal and perimenopausal disorder 11/13/2015   Acne erythematosa 11/13/2015   Disordered sleep 11/13/2015   History of operative procedure on hip 07/03/2015   Left knee pain 05/20/2014   Right foot pain 05/14/2014   Bilateral shoulder pain 05/14/2014   Hypothyroidism 03/08/2014   Degenerative arthritis of hip 03/07/2012    PCP: Cari Caraway, MD   REFERRING PROVIDER: Cari Caraway, MD   REFERRING DIAG:    THERAPY DIAG:  Chronic right shoulder pain   Pain in right hip   Chronic bilateral low back pain without sciatica   Pain in left hip   Other abnormalities of gait and mobility   ONSET DATE: Several years    SUBJECTIVE:                                                                                                                                                                                             SUBJECTIVE STATEMENT: Patient is feeling better now compared to the last visit. She found a lot of benefit from the last pool session. She had an injection on Wednesday and had improved pain in her back.  PERTINENT HISTORY:  Charcott foot, Left hip replacement and revision; right hip revision,  Scoliosis, DDD, Lumbar compression fracture; Right Shoulder RTC tear( repaired 2x) Rheumatoid arthritis ( although tests were negative; R foot pain;    PAIN:  Are you having pain? No 2/3 VAS scale: 0/10 over the past few days  Pain location: down the back  Pain orientation: Right  PAIN TYPE: aching Pain description: intermittent  Aggravating factors: standing and walking  Relieving factors: rest    Are you having pain? Yes 2/3 VAS scale: no pain after the injection  Pain location: low back  Pain orientation: Right and Left  PAIN TYPE: aching Pain description: intermittent  Aggravating factors: standing and walking  Relieving factors:rest    Are you having pain? Yes 1/12 VAS scale: 7/10 Pain location: right anterior shoulder  Pain orientation: Right  PAIN TYPE: aching Pain description: intermittent  Aggravating factors: use of the arm  Relieving factors: rest; heat, ice   PRECAUTIONS: None   WEIGHT BEARING RESTRICTIONS No   FALLS:  Has patient fallen in last 6 months? No, Number of falls:    LIVING ENVIRONMENT: 2 steps into the house  OCCUPATION:  Retired. Was working at [employer] but had to retire    PLOF: Independent with household mobility with device   PATIENT GOALS  To get rid of her cane      OBJECTIVE:   LE MMT:   MMT Right 08/12/2021 Left 08/12/2021  Hip flexion 22.5  25.7  20.7  29.7  Hip extension      Hip abduction 25.9  35.2 23.7  24.1  Hip adduction      Hip internal rotation      Hip external rotation      Knee flexion      Knee  extension 29.6  35.8 22.3  34.6  Ankle dorsiflexion      Ankle plantarflexion      Ankle inversion      Ankle eversion       (Blank rows = not  tested)  Flexion  Left 13.1 Right 6.2  ER Left 12.8 Right 7.9  IR  Left 14.8 Right:11.3 Her FOTO has remained the same but one of the questions was not well understood.   Axtive shoulder flexion 94 degrees with minor pain  ;    TODAY'S TREATMENT  2/6  Row 15 lbs 3x10  Leg press 50 lbs  Hip abduction 3x10 15 lbs   Reviewed numbers and her progress from previous visits.   2/3 Pt seen for aquatic therapy today.  Treatment took place in water 3.25-4.8 ft in depth at the [facility] pool. Temp of water was 91.  Pt entered/exited the pool via stairs (step through pattern) independently with bilat rail. Reviewed current function, pain levels, response to prior Rx, and HEP compliance.   Introduction to setting   Walking forward backward and side stepping multiple widths. Pt requires yellow hand buoys for balance support. Seated stretching Kick board push downs 3x15 Kick board traction stretch 5 x 10 sec hold Lateral traction 3x20 sec hold  Rainbow weights extension x10  Extension with speed x20   Side to side x10  Quick side to side 2x10  Hip abduction x10 and x10 weight speed  Hip circles x10 each direction each leg   Hip hinge x20          Pt requires buoyancy for support and to offload joints with strengthening exercises. Viscosity of the water is needed for resistance of strengthening; water current perturbations provides challenge to standing balance  unsupported, requiring increased core activation. Marland Kitchen    PATIENT EDUCATION:  Education details: anatomy, muscles and hip and back. Answered questions about current presentation and issues  Person educated: Patient Education method: Explanation, Demonstration, Tactile cues, and Verbal cues Education comprehension: verbalized understanding, returned  demonstration, verbal cues required, tactile cues required, and needs further education     HOME EXERCISE PROGRAM: [id number]   ASSESSMENT:   CLINICAL IMPRESSION: The patients strength has progressed well. She continues to have weakness in her left gluteal, but all other strength numbers have progressed. She has been going to the gym, but she is unsure weather she is doing her exercises correctly. Therapy reviewed the weights and the reps to start with. She was able to find 3 machines and the elliptical that is easy to get on to do. We measured her shoulder strength to. As expected she has deficit on the right v left.  We will continue to progress exercises and monitor symptoms as tolerated. We will extend patients POC 2W6 to continue to [facility] on progression of activity.   Objective impairments include Abnormal gait, decreased activity tolerance, decreased balance, difficulty walking, decreased ROM, decreased strength, increased fascial restrictions, increased muscle spasms, improper body mechanics, postural dysfunction, and pain. These impairments are limiting patient from cleaning, community activity, meal prep, occupation, shopping, and yard work. Personal factors including Charcott foot, Left hip replacement and revision; right hip revision, Scoliosis, DDD, Lumbar compression fracture; Right Shoulder RTC tea are also affecting patient's functional outcome. Patient will benefit from skilled PT to address above impairments and improve overall function.   REHAB POTENTIAL: Good   CLINICAL DECISION MAKING: Evolving/moderate complexity increasing pain in several joints   EVALUATION COMPLEXITY: Moderate     GOALS: Goals reviewed with patient? Yes   SHORT TERM GOALS:   STG Name Target Date Goal status  1 Patient will increase active shoulder flexion by 10 degrees Baseline:  09/02/2021 94 degrees  2/6   2 Patient will increase bilateral gross LE strength by 5 lbs  Baseline:  09/02/2021 All  measurements met goal except left abduction.  Partially met   3 Patient will be independent with basic HEP for UE/LE and low back  Baseline: 09/02/2021 Performing all exercises Met   LONG TERM GOALS:    LTG Name Target Date Goal status  1 Patient will stand for 1 hour without increased pain  Baseline: 09/23/2021 Not assessed   2 Patient will go up and down steps without increased pain  Baseline: 09/23/2021 Not assessed   3 Patient will reach an overhead shelf without pain.  Baseline: 09/23/2021 Not met per FOTO answer  PLAN: PT FREQUENCY: 2x/week   PT DURATION: 6 weeks   PLANNED INTERVENTIONS: Therapeutic exercises, Therapeutic activity, Neuro Muscular re-education, Balance training, Gait training, Patient/Family education, Joint mobilization, Aquatic Therapy, Dry Needling, Electrical stimulation, Cryotherapy, Moist heat, Taping, Ultrasound, and Manual therapy   PLAN FOR NEXT SESSION: revisit hip hinge exercise. Get medbridge code. consider manual therapy to upper traps and low back if needed. Patient more interested ine exercises in the pool and in the gym. Unable to give her exercises for her HEP 2nd to time. Consider wand flexion; table slides, ball rolling and press if able; scap retractions; LTR;  review squats; review free weight exercises.      Carolyne Littles PT DPT  2:27 PM, 09/07/21

## 2021-09-09 ENCOUNTER — Ambulatory Visit (HOSPITAL_BASED_OUTPATIENT_CLINIC_OR_DEPARTMENT_OTHER): Payer: Medicare Other | Admitting: Physical Therapy

## 2021-09-09 ENCOUNTER — Other Ambulatory Visit: Payer: Self-pay

## 2021-09-09 DIAGNOSIS — G8929 Other chronic pain: Secondary | ICD-10-CM

## 2021-09-09 DIAGNOSIS — M25551 Pain in right hip: Secondary | ICD-10-CM | POA: Diagnosis not present

## 2021-09-09 DIAGNOSIS — R2689 Other abnormalities of gait and mobility: Secondary | ICD-10-CM | POA: Diagnosis not present

## 2021-09-09 DIAGNOSIS — M25552 Pain in left hip: Secondary | ICD-10-CM | POA: Diagnosis not present

## 2021-09-09 DIAGNOSIS — M545 Low back pain, unspecified: Secondary | ICD-10-CM

## 2021-09-09 DIAGNOSIS — M25511 Pain in right shoulder: Secondary | ICD-10-CM

## 2021-09-09 NOTE — Therapy (Signed)
OUTPATIENT PHYSICAL THERAPY TREATMENT NOTE    See note below for Objective Data and Assessment of Progress/Goals.       Patient Name: Vrinda Heckstall MRN: [id number] DOB:Jul 28, 1951, 71 y.o., female Today's Date: 09/09/2021  PCP: Cari Caraway, MD REFERRING PROVIDER: Cari Caraway, MD       Past Medical History:  Diagnosis Date   Rheumatoid arthritis [facility])    Past Surgical History:  Procedure Laterality Date   BREAST BIOPSY Left    pt not sure of date.   HIP SURGERY     SHOULDER SURGERY     Patient Active Problem List   Diagnosis Date Noted   Effusion of left knee joint 08/17/2021   ADD (attention deficit disorder) 11/13/2015   Connective tissue disease overlap syndrome ([place]) 11/13/2015   Menopausal and perimenopausal disorder 11/13/2015   Acne erythematosa 11/13/2015   Disordered sleep 11/13/2015   History of operative procedure on hip 07/03/2015   Left knee pain 05/20/2014   Right foot pain 05/14/2014   Bilateral shoulder pain 05/14/2014   Hypothyroidism 03/08/2014   Degenerative arthritis of hip 03/07/2012    PCP: Cari Caraway, MD   REFERRING PROVIDER: Cari Caraway, MD   REFERRING DIAG:    THERAPY DIAG:  Chronic right shoulder pain   Pain in right hip   Chronic bilateral low back pain without sciatica   Pain in left hip   Other abnormalities of gait and mobility   ONSET DATE: Several years    SUBJECTIVE:                                                                                                                                                                                            SUBJECTIVE STATEMENT: The patient reports she over-did it last visit. Her knee is doing poorly. It is very swollen, and she feels like she is having difficulty weight bearing on it. She would like to focus on shoulder exercises today.   PERTINENT HISTORY:  Charcott foot, Left hip replacement and revision; right hip revision, Scoliosis, DDD, Lumbar compression  fracture; Right Shoulder RTC tear( repaired 2x) Rheumatoid arthritis ( although tests were negative; R foot pain;    PAIN:  Are you having pain? No 2/9 VAS scale: 0/10 over the past few days  Pain location:  back  Pain orientation: Right  PAIN TYPE: aching Pain description: intermittent  Aggravating factors: standing and walking  Relieving factors: rest    Are you having pain? Yes 2/9 VAS scale: 7/10 Pain location: right knee   Pain orientation: Right and Left  PAIN TYPE: aching Pain description: intermittent  Aggravating factors: standing and  walking  Relieving factors:rest      PRECAUTIONS: None   WEIGHT BEARING RESTRICTIONS No   FALLS:  Has patient fallen in last 6 months? No, Number of falls:    LIVING ENVIRONMENT: 2 steps into the house  OCCUPATION:  Retired. Was working at [employer] but had to retire    PLOF: Independent with household mobility with device   PATIENT GOALS  To get rid of her cane      OBJECTIVE:  ;    TODAY'S TREATMENT  2/8 Supine wand flexion 2lbs x20   Single arm 2lb weight 2x10  Supine scaption 2x10 2lbs   Prone row 2x10 2lbs  Prone extension 2x10 2lbs  Prone T 2x10 2lbs  Prone I 2x10 2lbs   Reviewed exercises to help keep her knee moving and reduce pain   Quad sets 3x10 5 sec hold  SLR 2x10 bilateral   Nu-step L2 for knee movement no increase in pain noted    2/6  Row 15 lbs 3x10  Leg press 50 lbs  Hip abduction 3x10 15 lbs   Reviewed numbers and her progress from previous visits.   2/3 Pt seen for aquatic therapy today.  Treatment took place in water 3.25-4.8 ft in depth at the [facility] pool. Temp of water was 91.  Pt entered/exited the pool via stairs (step through pattern) independently with bilat rail. Reviewed current function, pain levels, response to prior Rx, and HEP compliance.   Introduction to setting   Walking forward backward and side stepping multiple widths. Pt requires yellow  hand buoys for balance support. Seated stretching Kick board push downs 3x15 Kick board traction stretch 5 x 10 sec hold Lateral traction 3x20 sec hold  Rainbow weights extension x10  Extension with speed x20   Side to side x10  Quick side to side 2x10  Hip abduction x10 and x10 weight speed  Hip circles x10 each direction each leg   Hip hinge x20          Pt requires buoyancy for support and to offload joints with strengthening exercises. Viscosity of the water is needed for resistance of strengthening; water current perturbations provides challenge to standing balance unsupported, requiring increased core activation. Marland Kitchen    PATIENT EDUCATION:  Education details: anatomy, muscles and hip and back. Answered questions about current presentation and issues  Person educated: Patient Education method: Explanation, Demonstration, Tactile cues, and Verbal cues Education comprehension: verbalized understanding, returned demonstration, verbal cues required, tactile cues required, and needs further education     HOME EXERCISE PROGRAM: [id number]   ASSESSMENT:   CLINICAL IMPRESSION: The patient's knee was limited today. She had significant pain following working on exercises in the gym. Therapy reviewed exercises she can do to keep her moving. She was advised to ice and elevate when she gets home. Therapy focused on strengthening exercises instead for her shoulder we reviewed supine and prone exercises. She was advised not to stress her shoulder with prone exercises. Therapy will re-assess patient's knee next visit. She was advised to not perform gym exercise with her legs until we get some of the acute inflammation under control.    Objective impairments include Abnormal gait, decreased activity tolerance, decreased balance, difficulty walking, decreased ROM, decreased strength, increased fascial restrictions, increased muscle spasms, improper body mechanics, postural dysfunction, and  pain. These impairments are limiting patient from cleaning, community activity, meal prep, occupation, shopping, and yard work. Personal factors including Charcott foot, Left hip replacement and  revision; right hip revision, Scoliosis, DDD, Lumbar compression fracture; Right Shoulder RTC tea are also affecting patient's functional outcome. Patient will benefit from skilled PT to address above impairments and improve overall function.   REHAB POTENTIAL: Good   CLINICAL DECISION MAKING: Evolving/moderate complexity increasing pain in several joints   EVALUATION COMPLEXITY: Moderate     GOALS: Goals reviewed with patient? Yes   SHORT TERM GOALS:   STG Name Target Date Goal status  1 Patient will increase active shoulder flexion by 10 degrees Baseline:  09/02/2021 94 degrees  2/6   2 Patient will increase bilateral gross LE strength by 5 lbs  Baseline:  09/02/2021 All measurements met goal except left abduction.  Partially met   3 Patient will be independent with basic HEP for UE/LE and low back  Baseline: 09/02/2021 Performing all exercises Met   LONG TERM GOALS:    LTG Name Target Date Goal status  1 Patient will stand for 1 hour without increased pain  Baseline: 09/23/2021 Not assessed   2 Patient will go up and down steps without increased pain  Baseline: 09/23/2021 Not assessed   3 Patient will reach an overhead shelf without pain.  Baseline: 09/23/2021 Not met per FOTO answer  PLAN: PT FREQUENCY: 2x/week   PT DURATION: 6 weeks   PLANNED INTERVENTIONS: Therapeutic exercises, Therapeutic activity, Neuro Muscular re-education, Balance training, Gait training, Patient/Family education, Joint mobilization, Aquatic Therapy, Dry Needling, Electrical stimulation, Cryotherapy, Moist heat, Taping, Ultrasound, and Manual therapy   PLAN FOR NEXT SESSION: revisit hip hinge exercise. Get medbridge code. consider manual therapy to upper traps and low back if needed. Patient more interested ine  exercises in the pool and in the gym. Unable to give her exercises for her HEP 2nd to time. Consider wand flexion; table slides, ball rolling and press if able; scap retractions; LTR;  review squats; review free weight exercises.      Carolyne Littles PT DPT  2:15 PM, 09/09/21

## 2021-09-10 ENCOUNTER — Encounter (HOSPITAL_BASED_OUTPATIENT_CLINIC_OR_DEPARTMENT_OTHER): Payer: Self-pay | Admitting: Physical Therapy

## 2021-09-14 ENCOUNTER — Other Ambulatory Visit: Payer: Self-pay

## 2021-09-14 ENCOUNTER — Ambulatory Visit (HOSPITAL_BASED_OUTPATIENT_CLINIC_OR_DEPARTMENT_OTHER): Payer: Medicare Other | Admitting: Physical Therapy

## 2021-09-14 ENCOUNTER — Encounter (HOSPITAL_BASED_OUTPATIENT_CLINIC_OR_DEPARTMENT_OTHER): Payer: Self-pay | Admitting: Physical Therapy

## 2021-09-14 DIAGNOSIS — M25551 Pain in right hip: Secondary | ICD-10-CM

## 2021-09-14 DIAGNOSIS — G8929 Other chronic pain: Secondary | ICD-10-CM

## 2021-09-14 DIAGNOSIS — R2689 Other abnormalities of gait and mobility: Secondary | ICD-10-CM

## 2021-09-14 DIAGNOSIS — M25511 Pain in right shoulder: Secondary | ICD-10-CM | POA: Diagnosis not present

## 2021-09-14 DIAGNOSIS — M545 Low back pain, unspecified: Secondary | ICD-10-CM | POA: Diagnosis not present

## 2021-09-14 DIAGNOSIS — M25552 Pain in left hip: Secondary | ICD-10-CM | POA: Diagnosis not present

## 2021-09-14 NOTE — Therapy (Signed)
OUTPATIENT PHYSICAL THERAPY TREATMENT NOTE    See note below for Objective Data and Assessment of Progress/Goals.       Patient Name: Virginia Gordon MRN: [id number] DOB:1951/07/16, 71 y.o., female Today's Date: 09/14/2021  PCP: Cari Caraway, MD REFERRING PROVIDER: Cari Caraway, MD       Past Medical History:  Diagnosis Date   Rheumatoid arthritis [facility])    Past Surgical History:  Procedure Laterality Date   BREAST BIOPSY Left    pt not sure of date.   HIP SURGERY     SHOULDER SURGERY     Patient Active Problem List   Diagnosis Date Noted   Effusion of left knee joint 08/17/2021   ADD (attention deficit disorder) 11/13/2015   Connective tissue disease overlap syndrome ([place]) 11/13/2015   Menopausal and perimenopausal disorder 11/13/2015   Acne erythematosa 11/13/2015   Disordered sleep 11/13/2015   History of operative procedure on hip 07/03/2015   Left knee pain 05/20/2014   Right foot pain 05/14/2014   Bilateral shoulder pain 05/14/2014   Hypothyroidism 03/08/2014   Degenerative arthritis of hip 03/07/2012    PCP: Cari Caraway, MD   REFERRING PROVIDER: Cari Caraway, MD   REFERRING DIAG:    THERAPY DIAG:  Chronic right shoulder pain   Pain in right hip   Chronic bilateral low back pain without sciatica   Pain in left hip   Other abnormalities of gait and mobility   ONSET DATE: Several years    SUBJECTIVE:                                                                                                                                                                                            SUBJECTIVE STATEMENT: The patient feels like her knee may be a little better but not much. She still feels like it is very tight. She hasn't had to use the walker as much. She went to the gym and tried some exercises but had increased pain in the knee.  PERTINENT HISTORY:  Charcott foot, Left hip replacement and revision; right hip revision, Scoliosis, DDD,  Lumbar compression fracture; Right Shoulder RTC tear( repaired 2x) Rheumatoid arthritis ( although tests were negative; R foot pain;    PAIN:  Are you having pain? No 2/9 VAS scale: 0/10 over the past few days  Pain location:  back  Pain orientation: Right  PAIN TYPE: aching Pain description: intermittent  Aggravating factors: standing and walking  Relieving factors: rest    Are you having pain? Yes 2/9 VAS scale: 7/10 Pain location: right knee   Pain orientation: Right and Left  PAIN TYPE: aching  Pain description: intermittent  Aggravating factors: standing and walking  Relieving factors:rest      PRECAUTIONS: None   WEIGHT BEARING RESTRICTIONS No   FALLS:  Has patient fallen in last 6 months? No, Number of falls:    LIVING ENVIRONMENT: 2 steps into the house  OCCUPATION:  Retired. Was working at [employer] but had to retire    PLOF: Independent with household mobility with device   PATIENT GOALS  To get rid of her cane      OBJECTIVE:  ;    TODAY'S TREATMENT  2/13 Pt seen for aquatic therapy today.  Treatment took place in water 3.25-4.8 ft in depth at the [facility] pool. Temp of water was 91.  Pt entered/exited the pool via stairs (step through pattern) independently with bilat rail. Reviewed current function, pain levels, response to prior Rx, and HEP compliance.   Introduction to setting   Walking forward backward and side stepping multiple widths. Pt requires yellow hand buoys for balance support. Seated stretching Rainbow weights extension x10  Extension with speed x20   Hand paddle circles 2x10  Hand paddles ER/IR x20   Yellow noodle plank 3x30 sec hold  Yellow noodle plank push sand pull x10   Yellow weight plank 1x20 sec hold  Hamstring stretch 1x20 sec hold  Quad stretch standing 3x20 sec hold  Hip adducotr stretch 3x20 sec hold   Seated on noodle: laq's x20  Rainbow weight extension x20  Bicycle x20            Pt requires buoyancy for support and to offload joints with strengthening exercises. Viscosity of the water is needed for resistance of strengthening; water current perturbations provides challenge to standing balance unsupported, requiring increased core activation.  2/8 Supine wand flexion 2lbs x20   Single arm 2lb weight 2x10  Supine scaption 2x10 2lbs   Prone row 2x10 2lbs  Prone extension 2x10 2lbs  Prone T 2x10 2lbs  Prone I 2x10 2lbs   Reviewed exercises to help keep her knee moving and reduce pain   Quad sets 3x10 5 sec hold  SLR 2x10 bilateral   Nu-step L2 for knee movement no increase in pain noted    2/6  Row 15 lbs 3x10  Leg press 50 lbs  Hip abduction 3x10 15 lbs   Reviewed numbers and her progress from previous visits.   2/3 Pt seen for aquatic therapy today.  Treatment took place in water 3.25-4.8 ft in depth at the [facility] pool. Temp of water was 91.  Pt entered/exited the pool via stairs (step through pattern) independently with bilat rail. Reviewed current function, pain levels, response to prior Rx, and HEP compliance.   Introduction to setting   Walking forward backward and side stepping multiple widths. Pt requires yellow hand buoys for balance support. Seated stretching Kick board push downs 3x15 Kick board traction stretch 5 x 10 sec hold Lateral traction 3x20 sec hold  Rainbow weights extension x10  Extension with speed x20   Side to side x10  Quick side to side 2x10  Hip abduction x10 and x10 weight speed  Hip circles x10 each direction each leg   Hip hinge x20          Pt requires buoyancy for support and to offload joints with strengthening exercises. Viscosity of the water is needed for resistance of strengthening; water current perturbations provides challenge to standing balance unsupported, requiring increased core activation. Marland Kitchen    PATIENT  EDUCATION:  Education details: anatomy, muscles and hip  and back. Answered questions about current presentation and issues  Person educated: Patient Education method: Explanation, Demonstration, Tactile cues, and Verbal cues Education comprehension: verbalized understanding, returned demonstration, verbal cues required, tactile cues required, and needs further education     HOME EXERCISE PROGRAM: [id number]   ASSESSMENT:   CLINICAL IMPRESSION: The patient worked on shoulder exercises in the pool. She was able to perform I's and Y's with her shoulder submerged. She had no increase in pain in her shoulder but she could tell she was working them. She los performed higher level core stability exercises like a plank today. We trailed a step to see how her knee would respond.  She reported she could feel it but it wasn't too bad. Therapy will continue to progress as tolerated.   Objective impairments include Abnormal gait, decreased activity tolerance, decreased balance, difficulty walking, decreased ROM, decreased strength, increased fascial restrictions, increased muscle spasms, improper body mechanics, postural dysfunction, and pain. These impairments are limiting patient from cleaning, community activity, meal prep, occupation, shopping, and yard work. Personal factors including Charcott foot, Left hip replacement and revision; right hip revision, Scoliosis, DDD, Lumbar compression fracture; Right Shoulder RTC tea are also affecting patient's functional outcome. Patient will benefit from skilled PT to address above impairments and improve overall function.   REHAB POTENTIAL: Good   CLINICAL DECISION MAKING: Evolving/moderate complexity increasing pain in several joints   EVALUATION COMPLEXITY: Moderate     GOALS: Goals reviewed with patient? Yes   SHORT TERM GOALS:   STG Name Target Date Goal status  1 Patient will increase active shoulder flexion by 10 degrees Baseline:  09/02/2021 94 degrees  2/6   2 Patient will increase bilateral gross LE  strength by 5 lbs  Baseline:  09/02/2021 All measurements met goal except left abduction.  Partially met   3 Patient will be independent with basic HEP for UE/LE and low back  Baseline: 09/02/2021 Performing all exercises Met   LONG TERM GOALS:    LTG Name Target Date Goal status  1 Patient will stand for 1 hour without increased pain  Baseline: 09/23/2021 Not assessed   2 Patient will go up and down steps without increased pain  Baseline: 09/23/2021 Not assessed   3 Patient will reach an overhead shelf without pain.  Baseline: 09/23/2021 Not met per FOTO answer  PLAN: PT FREQUENCY: 2x/week   PT DURATION: 6 weeks   PLANNED INTERVENTIONS: Therapeutic exercises, Therapeutic activity, Neuro Muscular re-education, Balance training, Gait training, Patient/Family education, Joint mobilization, Aquatic Therapy, Dry Needling, Electrical stimulation, Cryotherapy, Moist heat, Taping, Ultrasound, and Manual therapy   PLAN FOR NEXT SESSION: revisit hip hinge exercise. Get medbridge code. consider manual therapy to upper traps and low back if needed. Patient more interested ine exercises in the pool and in the gym. Unable to give her exercises for her HEP 2nd to time. Consider wand flexion; table slides, ball rolling and press if able; scap retractions; LTR;  review squats; review free weight exercises.      Carolyne Littles PT DPT  1:09 PM, 09/14/21

## 2021-09-16 ENCOUNTER — Encounter (HOSPITAL_BASED_OUTPATIENT_CLINIC_OR_DEPARTMENT_OTHER): Payer: Self-pay

## 2021-09-16 ENCOUNTER — Encounter (HOSPITAL_BASED_OUTPATIENT_CLINIC_OR_DEPARTMENT_OTHER): Payer: Medicare Other | Admitting: Physical Therapy

## 2021-09-21 ENCOUNTER — Encounter (HOSPITAL_BASED_OUTPATIENT_CLINIC_OR_DEPARTMENT_OTHER): Payer: Medicare Other | Admitting: Physical Therapy

## 2021-09-22 ENCOUNTER — Ambulatory Visit (HOSPITAL_BASED_OUTPATIENT_CLINIC_OR_DEPARTMENT_OTHER): Payer: Medicare Other | Admitting: Physical Therapy

## 2021-09-23 ENCOUNTER — Encounter (HOSPITAL_BASED_OUTPATIENT_CLINIC_OR_DEPARTMENT_OTHER): Payer: Medicare Other | Admitting: Physical Therapy

## 2021-09-24 ENCOUNTER — Ambulatory Visit (HOSPITAL_BASED_OUTPATIENT_CLINIC_OR_DEPARTMENT_OTHER): Payer: Medicare Other | Admitting: Physical Therapy

## 2021-09-24 ENCOUNTER — Encounter (HOSPITAL_BASED_OUTPATIENT_CLINIC_OR_DEPARTMENT_OTHER): Payer: Self-pay

## 2021-09-24 DIAGNOSIS — Z20822 Contact with and (suspected) exposure to covid-19: Secondary | ICD-10-CM | POA: Diagnosis not present

## 2021-09-28 ENCOUNTER — Encounter (HOSPITAL_BASED_OUTPATIENT_CLINIC_OR_DEPARTMENT_OTHER): Payer: Medicare Other | Admitting: Physical Therapy

## 2021-09-29 ENCOUNTER — Encounter (HOSPITAL_BASED_OUTPATIENT_CLINIC_OR_DEPARTMENT_OTHER): Payer: Self-pay | Admitting: Physical Therapy

## 2021-09-29 ENCOUNTER — Ambulatory Visit (HOSPITAL_BASED_OUTPATIENT_CLINIC_OR_DEPARTMENT_OTHER): Payer: Medicare Other | Admitting: Physical Therapy

## 2021-09-29 ENCOUNTER — Other Ambulatory Visit: Payer: Self-pay

## 2021-09-29 DIAGNOSIS — M25511 Pain in right shoulder: Secondary | ICD-10-CM

## 2021-09-29 DIAGNOSIS — M25551 Pain in right hip: Secondary | ICD-10-CM

## 2021-09-29 DIAGNOSIS — R2689 Other abnormalities of gait and mobility: Secondary | ICD-10-CM | POA: Diagnosis not present

## 2021-09-29 DIAGNOSIS — M545 Low back pain, unspecified: Secondary | ICD-10-CM | POA: Diagnosis not present

## 2021-09-29 DIAGNOSIS — Z79899 Other long term (current) drug therapy: Secondary | ICD-10-CM | POA: Diagnosis not present

## 2021-09-29 DIAGNOSIS — M25552 Pain in left hip: Secondary | ICD-10-CM | POA: Diagnosis not present

## 2021-09-29 DIAGNOSIS — G8929 Other chronic pain: Secondary | ICD-10-CM | POA: Diagnosis not present

## 2021-09-29 DIAGNOSIS — E039 Hypothyroidism, unspecified: Secondary | ICD-10-CM | POA: Diagnosis not present

## 2021-09-29 NOTE — Therapy (Signed)
OUTPATIENT PHYSICAL THERAPY TREATMENT NOTE    See note below for Objective Data and Assessment of Progress/Goals.       Patient Name: Virginia Gordon MRN: [id number] DOB:[birth year], 71 y.o., female Today's Date: 09/29/2021  PCP: Cari Caraway, MD REFERRING PROVIDER: Cari Caraway, MD       Past Medical History:  Diagnosis Date   Rheumatoid arthritis [facility])    Past Surgical History:  Procedure Laterality Date   BREAST BIOPSY Left    pt not sure of date.   HIP SURGERY     SHOULDER SURGERY     Patient Active Problem List   Diagnosis Date Noted   Effusion of left knee joint 08/17/2021   ADD (attention deficit disorder) 11/13/2015   Connective tissue disease overlap syndrome ([place]) 11/13/2015   Menopausal and perimenopausal disorder 11/13/2015   Acne erythematosa 11/13/2015   Disordered sleep 11/13/2015   History of operative procedure on hip 07/03/2015   Left knee pain 05/20/2014   Right foot pain 05/14/2014   Bilateral shoulder pain 05/14/2014   Hypothyroidism 03/08/2014   Degenerative arthritis of hip 03/07/2012    PCP: Cari Caraway, MD   REFERRING PROVIDER: Cari Caraway, MD   REFERRING DIAG:    THERAPY DIAG:  Chronic right shoulder pain   Pain in right hip   Chronic bilateral low back pain without sciatica   Pain in left hip   Other abnormalities of gait and mobility   ONSET DATE: Several years    SUBJECTIVE:                                                                                                                                                                                            SUBJECTIVE STATEMENT: The patient was doing well but had to move a a few weekends ago. She did a lot of packing. She then bent over and felt a pull in her hip. She had significant pain. She tried to do some exercises. She came to the gym and then had a fall. She has significant buring of her arm and a raised area. She reports it is not painful, But she was  strongly advised to call Dr Leonides Schanz. The patient is having a significant gait deviation today with th left leg collapsing in significantly. She is using her cane.    PERTINENT HISTORY:  Charcott foot, Left hip replacement and revision; right hip revision, Scoliosis, DDD, Lumbar compression fracture; Right Shoulder RTC tear( repaired 2x) Rheumatoid arthritis ( although tests were negative; R foot pain;    PAIN:  Are you having pain? No 2/9 VAS scale: 0/10 over the past few days  Pain location:  back  Pain orientation: Right  PAIN TYPE: aching Pain description: intermittent  Aggravating factors: standing and walking  Relieving factors: rest    Are you having pain? Yes 2/9 VAS scale: 7/10 Pain location: right knee   Pain orientation: Right and Left  PAIN TYPE: aching Pain description: intermittent  Aggravating factors: standing and walking  Relieving factors:rest      PRECAUTIONS: None   WEIGHT BEARING RESTRICTIONS No   FALLS:  Has patient fallen in last 6 months? No, Number of falls:    LIVING ENVIRONMENT: 2 steps into the house  OCCUPATION:  Retired. Was working at [employer] but had to retire    PLOF: Independent with household mobility with device   PATIENT GOALS  To get rid of her cane      OBJECTIVE:  ;    TODAY'S TREATMENT  2/28 Manual: trigger point release to gluteal and lumbar spine in right side lying; reviewed use of thera-cane for low back trigger points. Reviewed how to use and where to buy.  Grade 1 oscillations without distraction to reduce inflammation of the hip and back   Ball rolls x10 10 sec hold  Lateral ball rolls 2x5 each side 5 sec hold   Assessed mobility in back and the hip. Patient has pain with flexion and extension of her back. Significant trigger points in her back.          2/13 Pt seen for aquatic therapy today.  Treatment took place in water 3.25-4.8 ft in depth at the [facility] pool. Temp of water was  91.  Pt entered/exited the pool via stairs (step through pattern) independently with bilat rail. Reviewed current function, pain levels, response to prior Rx, and HEP compliance.   Introduction to setting   Walking forward backward and side stepping multiple widths. Pt requires yellow hand buoys for balance support. Seated stretching Rainbow weights extension x10  Extension with speed x20   Hand paddle circles 2x10  Hand paddles ER/IR x20   Yellow noodle plank 3x30 sec hold  Yellow noodle plank push sand pull x10   Yellow weight plank 1x20 sec hold  Hamstring stretch 1x20 sec hold  Quad stretch standing 3x20 sec hold  Hip adducotr stretch 3x20 sec hold   Seated on noodle: laq's x20  Rainbow weight extension x20  Bicycle x20           Pt requires buoyancy for support and to offload joints with strengthening exercises. Viscosity of the water is needed for resistance of strengthening; water current perturbations provides challenge to standing balance unsupported, requiring increased core activation.  2/8 Supine wand flexion 2lbs x20   Single arm 2lb weight 2x10  Supine scaption 2x10 2lbs   Prone row 2x10 2lbs  Prone extension 2x10 2lbs  Prone T 2x10 2lbs  Prone I 2x10 2lbs   Reviewed exercises to help keep her knee moving and reduce pain   Quad sets 3x10 5 sec hold  SLR 2x10 bilateral   Nu-step L2 for knee movement no increase in pain noted    2/6  Row 15 lbs 3x10  Leg press 50 lbs  Hip abduction 3x10 15 lbs   Reviewed numbers and her progress from previous visits.   2/3 Pt seen for aquatic therapy today.  Treatment took place in water 3.25-4.8 ft in depth at the [facility] pool. Temp of water was 91.  Pt entered/exited the pool via stairs (step through pattern) independently with bilat  rail. Reviewed current function, pain levels, response to prior Rx, and HEP compliance.   Introduction to setting   Walking forward backward and side stepping  multiple widths. Pt requires yellow hand buoys for balance support. Seated stretching Kick board push downs 3x15 Kick board traction stretch 5 x 10 sec hold Lateral traction 3x20 sec hold  Rainbow weights extension x10  Extension with speed x20   Side to side x10  Quick side to side 2x10  Hip abduction x10 and x10 weight speed  Hip circles x10 each direction each leg   Hip hinge x20          Pt requires buoyancy for support and to offload joints with strengthening exercises. Viscosity of the water is needed for resistance of strengthening; water current perturbations provides challenge to standing balance unsupported, requiring increased core activation. Marland Kitchen    PATIENT EDUCATION:  Education details: importance of getting her arm checked. Actue lumbar strain POC and expectations.   Person educated: Patient Education method: Explanation, Demonstration, Tactile cues, and Verbal cues Education comprehension: verbalized understanding, returned demonstration, verbal cues required, tactile cues required, and needs further education     HOME EXERCISE PROGRAM: [id number]   ASSESSMENT:   CLINICAL IMPRESSION: Therapy assessed the patients back and hip today. She had full hip motion without pain. She has pain with lumbar flexion and extension. She has significant trigger points in her lower back and hip. Her arm has significant bruising and a large lump. She was advised she should get that checked out and maybe her back as well. Therapy performed manual therapy and stretching to her back. She was shown the thera-cane and how to use it.   Objective impairments include Abnormal gait, decreased activity tolerance, decreased balance, difficulty walking, decreased ROM, decreased strength, increased fascial restrictions, increased muscle spasms, improper body mechanics, postural dysfunction, and pain. These impairments are limiting patient from cleaning, community activity, meal prep, occupation,  shopping, and yard work. Personal factors including Charcott foot, Left hip replacement and revision; right hip revision, Scoliosis, DDD, Lumbar compression fracture; Right Shoulder RTC tea are also affecting patient's functional outcome. Patient will benefit from skilled PT to address above impairments and improve overall function.   REHAB POTENTIAL: Good   CLINICAL DECISION MAKING: Evolving/moderate complexity increasing pain in several joints   EVALUATION COMPLEXITY: Moderate     GOALS: Goals reviewed with patient? Yes   SHORT TERM GOALS:   STG Name Target Date Goal status  1 Patient will increase active shoulder flexion by 10 degrees Baseline:  09/02/2021 94 degrees  2/6   2 Patient will increase bilateral gross LE strength by 5 lbs  Baseline:  09/02/2021 All measurements met goal except left abduction.  Partially met   3 Patient will be independent with basic HEP for UE/LE and low back  Baseline: 09/02/2021 Performing all exercises Met   LONG TERM GOALS:    LTG Name Target Date Goal status  1 Patient will stand for 1 hour without increased pain  Baseline: 09/23/2021 Not assessed   2 Patient will go up and down steps without increased pain  Baseline: 09/23/2021 Not assessed   3 Patient will reach an overhead shelf without pain.  Baseline: 09/23/2021 Not met per FOTO answer  PLAN: PT FREQUENCY: 2x/week   PT DURATION: 6 weeks   PLANNED INTERVENTIONS: Therapeutic exercises, Therapeutic activity, Neuro Muscular re-education, Balance training, Gait training, Patient/Family education, Joint mobilization, Aquatic Therapy, Dry Needling, Electrical stimulation, Cryotherapy, Moist heat, Taping, Ultrasound, and Manual  therapy   PLAN FOR NEXT SESSION: revisit hip hinge exercise. Get medbridge code. consider manual therapy to upper traps and low back if needed. Patient more interested ine exercises in the pool and in the gym. Unable to give her exercises for her HEP 2nd to time. Consider wand  flexion; table slides, ball rolling and press if able; scap retractions; LTR;  review squats; review free weight exercises.      Carolyne Littles PT DPT  11:53 AM, 09/29/21

## 2021-10-02 DIAGNOSIS — S73005A Unspecified dislocation of left hip, initial encounter: Secondary | ICD-10-CM | POA: Diagnosis not present

## 2021-10-02 DIAGNOSIS — Y9239 Other specified sports and athletic area as the place of occurrence of the external cause: Secondary | ICD-10-CM | POA: Diagnosis not present

## 2021-10-02 DIAGNOSIS — S32599A Other specified fracture of unspecified pubis, initial encounter for closed fracture: Secondary | ICD-10-CM | POA: Diagnosis not present

## 2021-10-02 DIAGNOSIS — M9702XA Periprosthetic fracture around internal prosthetic left hip joint, initial encounter: Secondary | ICD-10-CM | POA: Diagnosis present

## 2021-10-02 DIAGNOSIS — T84031A Mechanical loosening of internal left hip prosthetic joint, initial encounter: Secondary | ICD-10-CM | POA: Diagnosis not present

## 2021-10-02 DIAGNOSIS — D62 Acute posthemorrhagic anemia: Secondary | ICD-10-CM | POA: Diagnosis not present

## 2021-10-02 DIAGNOSIS — Y9339 Activity, other involving climbing, rappelling and jumping off: Secondary | ICD-10-CM | POA: Diagnosis not present

## 2021-10-02 DIAGNOSIS — M25352 Other instability, left hip: Secondary | ICD-10-CM | POA: Diagnosis present

## 2021-10-02 DIAGNOSIS — W1839XA Other fall on same level, initial encounter: Secondary | ICD-10-CM | POA: Diagnosis not present

## 2021-10-02 DIAGNOSIS — T84011A Broken internal left hip prosthesis, initial encounter: Secondary | ICD-10-CM | POA: Diagnosis not present

## 2021-10-02 DIAGNOSIS — M9702XD Periprosthetic fracture around internal prosthetic left hip joint, subsequent encounter: Secondary | ICD-10-CM | POA: Diagnosis not present

## 2021-10-02 DIAGNOSIS — Y9289 Other specified places as the place of occurrence of the external cause: Secondary | ICD-10-CM | POA: Diagnosis not present

## 2021-10-02 DIAGNOSIS — M47816 Spondylosis without myelopathy or radiculopathy, lumbar region: Secondary | ICD-10-CM | POA: Diagnosis not present

## 2021-10-02 DIAGNOSIS — M069 Rheumatoid arthritis, unspecified: Secondary | ICD-10-CM | POA: Diagnosis present

## 2021-10-02 DIAGNOSIS — T797XXA Traumatic subcutaneous emphysema, initial encounter: Secondary | ICD-10-CM | POA: Diagnosis not present

## 2021-10-02 DIAGNOSIS — Z20822 Contact with and (suspected) exposure to covid-19: Secondary | ICD-10-CM | POA: Diagnosis present

## 2021-10-02 DIAGNOSIS — S329XXA Fracture of unspecified parts of lumbosacral spine and pelvis, initial encounter for closed fracture: Secondary | ICD-10-CM | POA: Diagnosis not present

## 2021-10-02 DIAGNOSIS — S32119A Unspecified Zone I fracture of sacrum, initial encounter for closed fracture: Secondary | ICD-10-CM | POA: Diagnosis present

## 2021-10-02 DIAGNOSIS — T84021A Dislocation of internal left hip prosthesis, initial encounter: Secondary | ICD-10-CM | POA: Diagnosis present

## 2021-10-02 DIAGNOSIS — M898X5 Other specified disorders of bone, thigh: Secondary | ICD-10-CM | POA: Diagnosis not present

## 2021-10-02 DIAGNOSIS — M7989 Other specified soft tissue disorders: Secondary | ICD-10-CM | POA: Diagnosis not present

## 2021-10-02 DIAGNOSIS — T84021D Dislocation of internal left hip prosthesis, subsequent encounter: Secondary | ICD-10-CM | POA: Diagnosis not present

## 2021-10-02 DIAGNOSIS — G8918 Other acute postprocedural pain: Secondary | ICD-10-CM | POA: Diagnosis not present

## 2021-10-02 DIAGNOSIS — K219 Gastro-esophageal reflux disease without esophagitis: Secondary | ICD-10-CM | POA: Diagnosis present

## 2021-10-02 DIAGNOSIS — I10 Essential (primary) hypertension: Secondary | ICD-10-CM | POA: Diagnosis present

## 2021-10-02 DIAGNOSIS — E039 Hypothyroidism, unspecified: Secondary | ICD-10-CM | POA: Diagnosis present

## 2021-10-02 DIAGNOSIS — Z96642 Presence of left artificial hip joint: Secondary | ICD-10-CM | POA: Diagnosis not present

## 2021-10-02 DIAGNOSIS — W19XXXA Unspecified fall, initial encounter: Secondary | ICD-10-CM | POA: Diagnosis not present

## 2021-10-02 DIAGNOSIS — F909 Attention-deficit hyperactivity disorder, unspecified type: Secondary | ICD-10-CM | POA: Diagnosis present

## 2021-10-02 DIAGNOSIS — T8450XA Infection and inflammatory reaction due to unspecified internal joint prosthesis, initial encounter: Secondary | ICD-10-CM | POA: Diagnosis not present

## 2021-10-02 DIAGNOSIS — Z87891 Personal history of nicotine dependence: Secondary | ICD-10-CM | POA: Diagnosis not present

## 2021-10-02 DIAGNOSIS — Y792 Prosthetic and other implants, materials and accessory orthopedic devices associated with adverse incidents: Secondary | ICD-10-CM | POA: Diagnosis not present

## 2021-10-03 DIAGNOSIS — T8450XA Infection and inflammatory reaction due to unspecified internal joint prosthesis, initial encounter: Secondary | ICD-10-CM | POA: Diagnosis not present

## 2021-10-03 DIAGNOSIS — G8918 Other acute postprocedural pain: Secondary | ICD-10-CM | POA: Diagnosis not present

## 2021-10-03 DIAGNOSIS — I10 Essential (primary) hypertension: Secondary | ICD-10-CM | POA: Diagnosis present

## 2021-10-03 DIAGNOSIS — T84031A Mechanical loosening of internal left hip prosthetic joint, initial encounter: Secondary | ICD-10-CM | POA: Diagnosis not present

## 2021-10-03 DIAGNOSIS — M898X5 Other specified disorders of bone, thigh: Secondary | ICD-10-CM | POA: Diagnosis not present

## 2021-10-03 DIAGNOSIS — Z96642 Presence of left artificial hip joint: Secondary | ICD-10-CM | POA: Diagnosis not present

## 2021-10-03 DIAGNOSIS — S73005A Unspecified dislocation of left hip, initial encounter: Secondary | ICD-10-CM | POA: Diagnosis not present

## 2021-10-03 DIAGNOSIS — Z87891 Personal history of nicotine dependence: Secondary | ICD-10-CM | POA: Diagnosis not present

## 2021-10-03 DIAGNOSIS — T84021A Dislocation of internal left hip prosthesis, initial encounter: Secondary | ICD-10-CM | POA: Diagnosis present

## 2021-10-03 DIAGNOSIS — F909 Attention-deficit hyperactivity disorder, unspecified type: Secondary | ICD-10-CM | POA: Diagnosis present

## 2021-10-03 DIAGNOSIS — T84021D Dislocation of internal left hip prosthesis, subsequent encounter: Secondary | ICD-10-CM | POA: Diagnosis not present

## 2021-10-03 DIAGNOSIS — M47816 Spondylosis without myelopathy or radiculopathy, lumbar region: Secondary | ICD-10-CM | POA: Diagnosis not present

## 2021-10-03 DIAGNOSIS — Y9339 Activity, other involving climbing, rappelling and jumping off: Secondary | ICD-10-CM | POA: Diagnosis not present

## 2021-10-03 DIAGNOSIS — M25352 Other instability, left hip: Secondary | ICD-10-CM | POA: Diagnosis present

## 2021-10-03 DIAGNOSIS — W1839XA Other fall on same level, initial encounter: Secondary | ICD-10-CM | POA: Diagnosis not present

## 2021-10-03 DIAGNOSIS — Z20822 Contact with and (suspected) exposure to covid-19: Secondary | ICD-10-CM | POA: Diagnosis present

## 2021-10-03 DIAGNOSIS — S329XXA Fracture of unspecified parts of lumbosacral spine and pelvis, initial encounter for closed fracture: Secondary | ICD-10-CM | POA: Diagnosis not present

## 2021-10-03 DIAGNOSIS — M9702XD Periprosthetic fracture around internal prosthetic left hip joint, subsequent encounter: Secondary | ICD-10-CM | POA: Diagnosis not present

## 2021-10-03 DIAGNOSIS — D62 Acute posthemorrhagic anemia: Secondary | ICD-10-CM | POA: Diagnosis not present

## 2021-10-03 DIAGNOSIS — M069 Rheumatoid arthritis, unspecified: Secondary | ICD-10-CM | POA: Diagnosis present

## 2021-10-03 DIAGNOSIS — E039 Hypothyroidism, unspecified: Secondary | ICD-10-CM | POA: Diagnosis present

## 2021-10-03 DIAGNOSIS — K219 Gastro-esophageal reflux disease without esophagitis: Secondary | ICD-10-CM | POA: Diagnosis present

## 2021-10-03 DIAGNOSIS — Y9239 Other specified sports and athletic area as the place of occurrence of the external cause: Secondary | ICD-10-CM | POA: Diagnosis not present

## 2021-10-03 DIAGNOSIS — T797XXA Traumatic subcutaneous emphysema, initial encounter: Secondary | ICD-10-CM | POA: Diagnosis not present

## 2021-10-03 DIAGNOSIS — S32599A Other specified fracture of unspecified pubis, initial encounter for closed fracture: Secondary | ICD-10-CM | POA: Diagnosis not present

## 2021-10-03 DIAGNOSIS — M9702XA Periprosthetic fracture around internal prosthetic left hip joint, initial encounter: Secondary | ICD-10-CM | POA: Diagnosis present

## 2021-10-03 DIAGNOSIS — S32119A Unspecified Zone I fracture of sacrum, initial encounter for closed fracture: Secondary | ICD-10-CM | POA: Diagnosis present

## 2021-10-06 DIAGNOSIS — M898X5 Other specified disorders of bone, thigh: Secondary | ICD-10-CM | POA: Diagnosis not present

## 2021-10-06 DIAGNOSIS — T84031A Mechanical loosening of internal left hip prosthetic joint, initial encounter: Secondary | ICD-10-CM | POA: Diagnosis not present

## 2021-10-06 DIAGNOSIS — M25352 Other instability, left hip: Secondary | ICD-10-CM | POA: Diagnosis not present

## 2021-10-06 DIAGNOSIS — W1839XA Other fall on same level, initial encounter: Secondary | ICD-10-CM | POA: Diagnosis not present

## 2021-10-06 DIAGNOSIS — S73005A Unspecified dislocation of left hip, initial encounter: Secondary | ICD-10-CM | POA: Diagnosis not present

## 2021-10-06 DIAGNOSIS — Y9239 Other specified sports and athletic area as the place of occurrence of the external cause: Secondary | ICD-10-CM | POA: Diagnosis not present

## 2021-10-07 DIAGNOSIS — T8450XA Infection and inflammatory reaction due to unspecified internal joint prosthesis, initial encounter: Secondary | ICD-10-CM | POA: Diagnosis not present

## 2021-10-08 DIAGNOSIS — M25352 Other instability, left hip: Secondary | ICD-10-CM | POA: Diagnosis not present

## 2021-10-09 DIAGNOSIS — T8450XA Infection and inflammatory reaction due to unspecified internal joint prosthesis, initial encounter: Secondary | ICD-10-CM | POA: Diagnosis not present

## 2021-10-13 ENCOUNTER — Ambulatory Visit (HOSPITAL_BASED_OUTPATIENT_CLINIC_OR_DEPARTMENT_OTHER): Payer: Medicare Other | Admitting: Physical Therapy

## 2021-10-14 DIAGNOSIS — M24452 Recurrent dislocation, left hip: Secondary | ICD-10-CM | POA: Diagnosis not present

## 2021-10-14 DIAGNOSIS — S73005A Unspecified dislocation of left hip, initial encounter: Secondary | ICD-10-CM | POA: Diagnosis not present

## 2021-10-14 DIAGNOSIS — T8484XA Pain due to internal orthopedic prosthetic devices, implants and grafts, initial encounter: Secondary | ICD-10-CM | POA: Diagnosis not present

## 2021-10-15 ENCOUNTER — Ambulatory Visit (HOSPITAL_BASED_OUTPATIENT_CLINIC_OR_DEPARTMENT_OTHER): Payer: Medicare Other | Admitting: Physical Therapy

## 2021-10-19 DIAGNOSIS — Z20822 Contact with and (suspected) exposure to covid-19: Secondary | ICD-10-CM | POA: Diagnosis not present

## 2021-10-20 ENCOUNTER — Encounter (HOSPITAL_BASED_OUTPATIENT_CLINIC_OR_DEPARTMENT_OTHER): Payer: Medicare Other | Admitting: Physical Therapy

## 2021-10-20 DIAGNOSIS — S73005A Unspecified dislocation of left hip, initial encounter: Secondary | ICD-10-CM | POA: Diagnosis not present

## 2021-10-20 DIAGNOSIS — M24452 Recurrent dislocation, left hip: Secondary | ICD-10-CM | POA: Diagnosis not present

## 2021-10-20 DIAGNOSIS — T8484XA Pain due to internal orthopedic prosthetic devices, implants and grafts, initial encounter: Secondary | ICD-10-CM | POA: Diagnosis not present

## 2021-10-22 ENCOUNTER — Ambulatory Visit (HOSPITAL_BASED_OUTPATIENT_CLINIC_OR_DEPARTMENT_OTHER): Payer: Medicare Other | Admitting: Physical Therapy

## 2021-10-23 DIAGNOSIS — Z20822 Contact with and (suspected) exposure to covid-19: Secondary | ICD-10-CM | POA: Diagnosis not present

## 2021-10-26 ENCOUNTER — Encounter (HOSPITAL_BASED_OUTPATIENT_CLINIC_OR_DEPARTMENT_OTHER): Payer: Medicare Other | Admitting: Physical Therapy

## 2021-10-28 ENCOUNTER — Ambulatory Visit (HOSPITAL_BASED_OUTPATIENT_CLINIC_OR_DEPARTMENT_OTHER): Payer: Medicare Other | Admitting: Physical Therapy

## 2021-10-29 DIAGNOSIS — F902 Attention-deficit hyperactivity disorder, combined type: Secondary | ICD-10-CM | POA: Diagnosis not present

## 2021-11-02 DIAGNOSIS — Z20822 Contact with and (suspected) exposure to covid-19: Secondary | ICD-10-CM | POA: Diagnosis not present

## 2021-11-03 DIAGNOSIS — S73005A Unspecified dislocation of left hip, initial encounter: Secondary | ICD-10-CM | POA: Diagnosis not present

## 2021-11-03 DIAGNOSIS — M24452 Recurrent dislocation, left hip: Secondary | ICD-10-CM | POA: Diagnosis not present

## 2021-11-03 DIAGNOSIS — T8484XA Pain due to internal orthopedic prosthetic devices, implants and grafts, initial encounter: Secondary | ICD-10-CM | POA: Diagnosis not present

## 2021-11-09 ENCOUNTER — Encounter (HOSPITAL_BASED_OUTPATIENT_CLINIC_OR_DEPARTMENT_OTHER): Payer: Self-pay | Admitting: Physical Therapy

## 2021-11-16 DIAGNOSIS — Z1331 Encounter for screening for depression: Secondary | ICD-10-CM | POA: Diagnosis not present

## 2021-11-16 DIAGNOSIS — M1990 Unspecified osteoarthritis, unspecified site: Secondary | ICD-10-CM | POA: Diagnosis not present

## 2021-11-16 DIAGNOSIS — T8452XD Infection and inflammatory reaction due to internal left hip prosthesis, subsequent encounter: Secondary | ICD-10-CM | POA: Diagnosis not present

## 2021-11-16 DIAGNOSIS — Z79899 Other long term (current) drug therapy: Secondary | ICD-10-CM | POA: Diagnosis not present

## 2021-11-16 DIAGNOSIS — M351 Other overlap syndromes: Secondary | ICD-10-CM | POA: Diagnosis not present

## 2021-11-16 DIAGNOSIS — G479 Sleep disorder, unspecified: Secondary | ICD-10-CM | POA: Diagnosis not present

## 2021-11-16 DIAGNOSIS — F909 Attention-deficit hyperactivity disorder, unspecified type: Secondary | ICD-10-CM | POA: Diagnosis not present

## 2021-11-16 DIAGNOSIS — Z90710 Acquired absence of both cervix and uterus: Secondary | ICD-10-CM | POA: Diagnosis not present

## 2021-11-16 DIAGNOSIS — I10 Essential (primary) hypertension: Secondary | ICD-10-CM | POA: Diagnosis not present

## 2021-11-16 DIAGNOSIS — Z87891 Personal history of nicotine dependence: Secondary | ICD-10-CM | POA: Diagnosis not present

## 2021-11-16 DIAGNOSIS — Z791 Long term (current) use of non-steroidal anti-inflammatories (NSAID): Secondary | ICD-10-CM | POA: Diagnosis not present

## 2021-11-16 DIAGNOSIS — M069 Rheumatoid arthritis, unspecified: Secondary | ICD-10-CM | POA: Diagnosis not present

## 2021-11-16 DIAGNOSIS — Z792 Long term (current) use of antibiotics: Secondary | ICD-10-CM | POA: Diagnosis not present

## 2021-11-16 DIAGNOSIS — K219 Gastro-esophageal reflux disease without esophagitis: Secondary | ICD-10-CM | POA: Diagnosis not present

## 2021-11-21 DIAGNOSIS — Z20822 Contact with and (suspected) exposure to covid-19: Secondary | ICD-10-CM | POA: Diagnosis not present

## 2021-11-23 DIAGNOSIS — M2170 Unequal limb length (acquired), unspecified site: Secondary | ICD-10-CM | POA: Diagnosis not present

## 2021-11-23 DIAGNOSIS — S32592D Other specified fracture of left pubis, subsequent encounter for fracture with routine healing: Secondary | ICD-10-CM | POA: Diagnosis not present

## 2021-11-23 DIAGNOSIS — Z96642 Presence of left artificial hip joint: Secondary | ICD-10-CM | POA: Diagnosis not present

## 2021-11-23 DIAGNOSIS — W1839XD Other fall on same level, subsequent encounter: Secondary | ICD-10-CM | POA: Diagnosis not present

## 2021-11-23 DIAGNOSIS — M21062 Valgus deformity, not elsewhere classified, left knee: Secondary | ICD-10-CM | POA: Diagnosis not present

## 2021-11-27 IMAGING — DX DG HIP (WITH OR WITHOUT PELVIS) 2-3V*L*
3 series · 3 of 3 positions shown · non-contrast
Comparison: 05/26/2020

CLINICAL DATA: Left hip dislocation

EXAM:
DG HIP (WITH OR WITHOUT PELVIS) 2-3V LEFT

[pelvis ap]
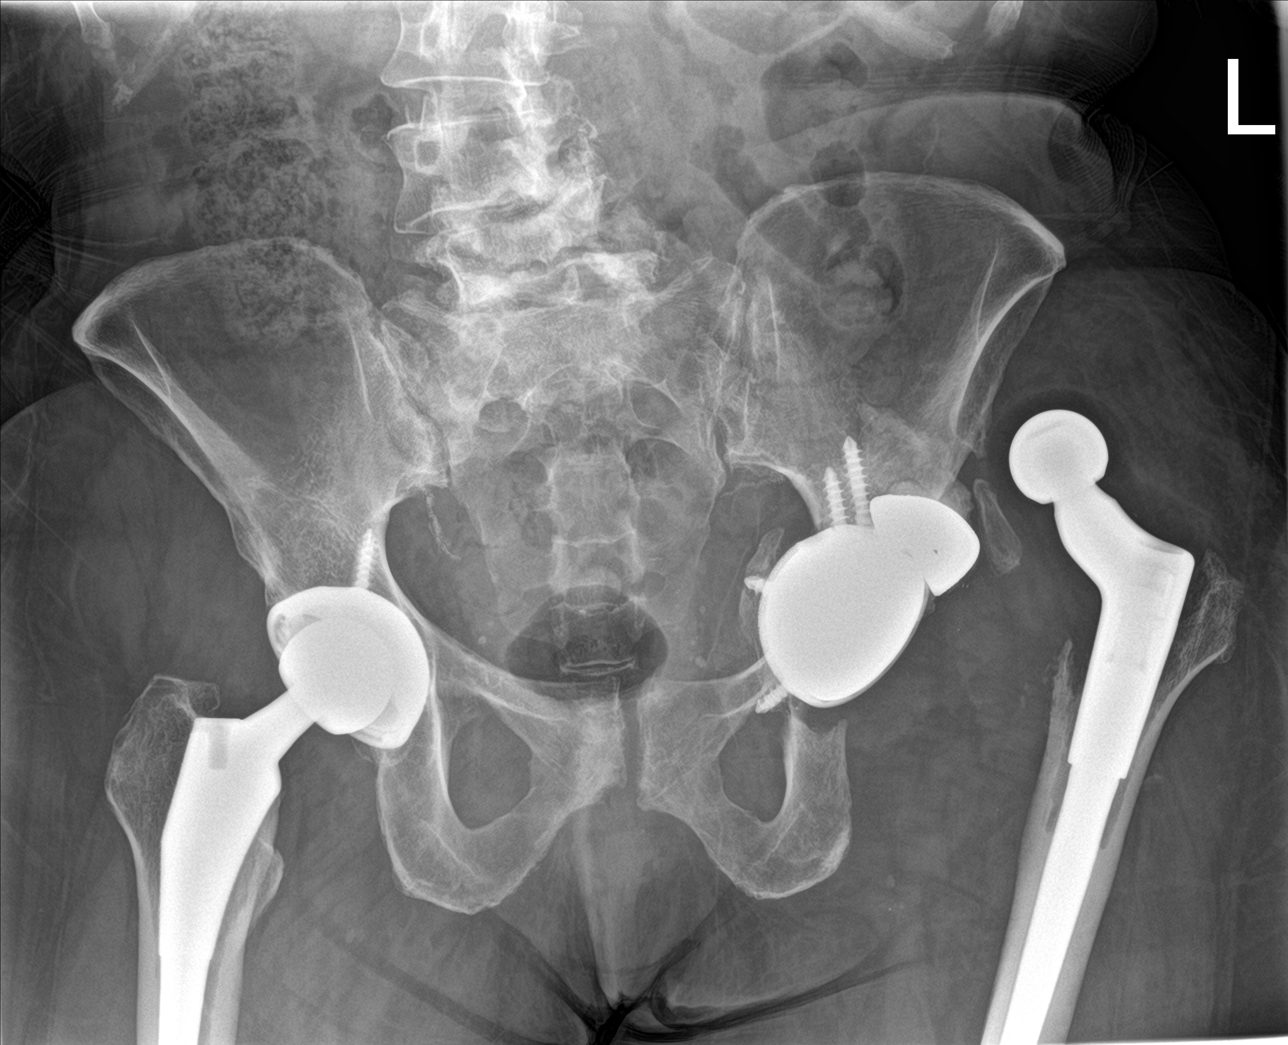

[hip ap]
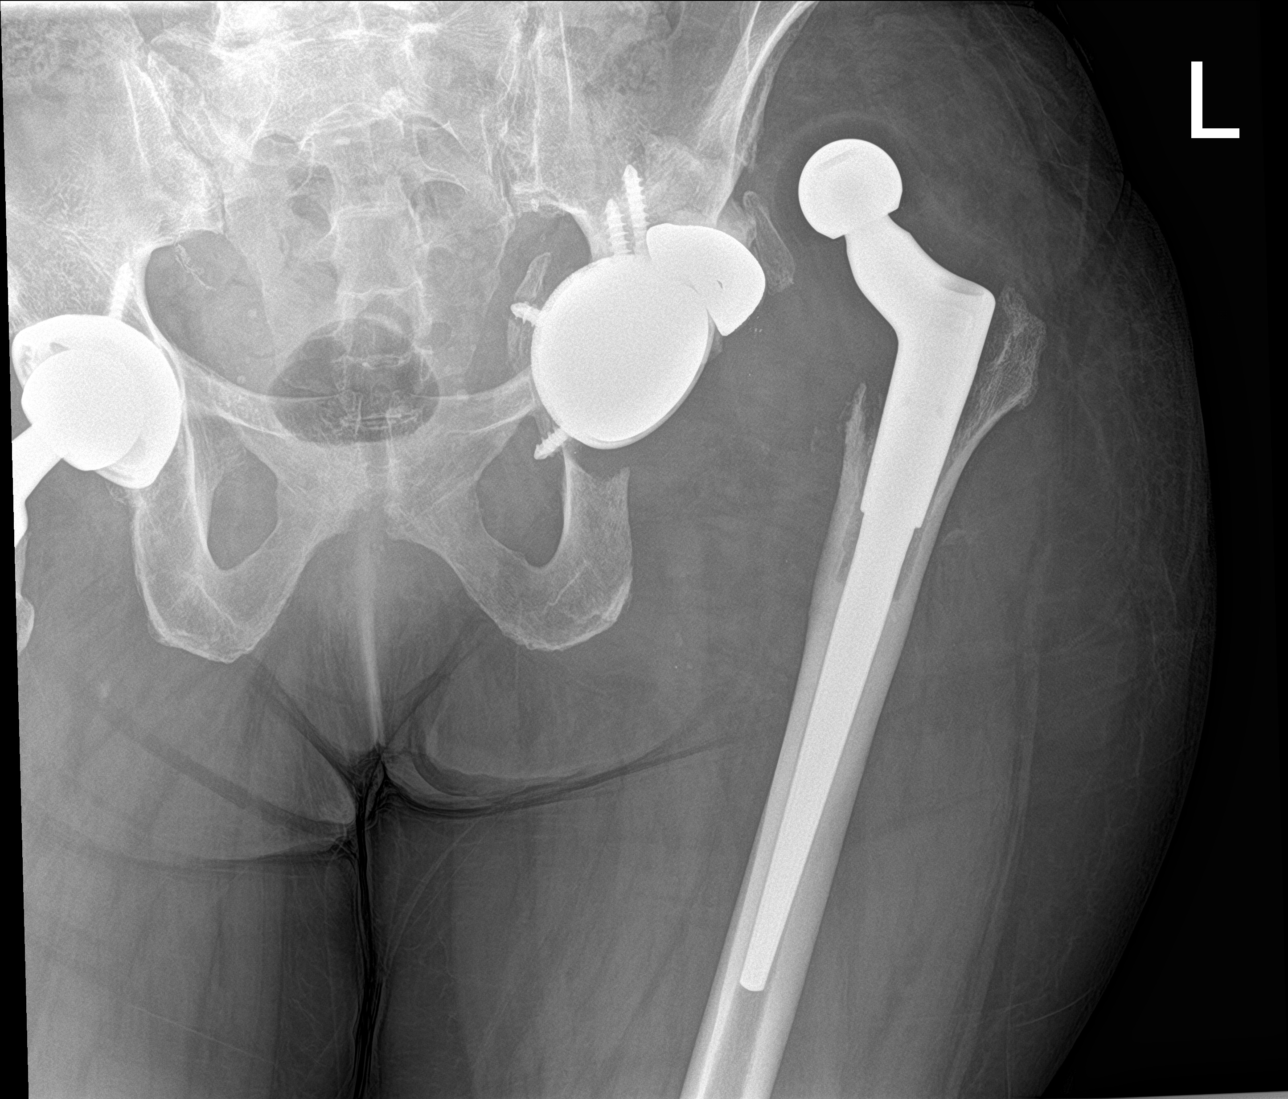

[hip lat]
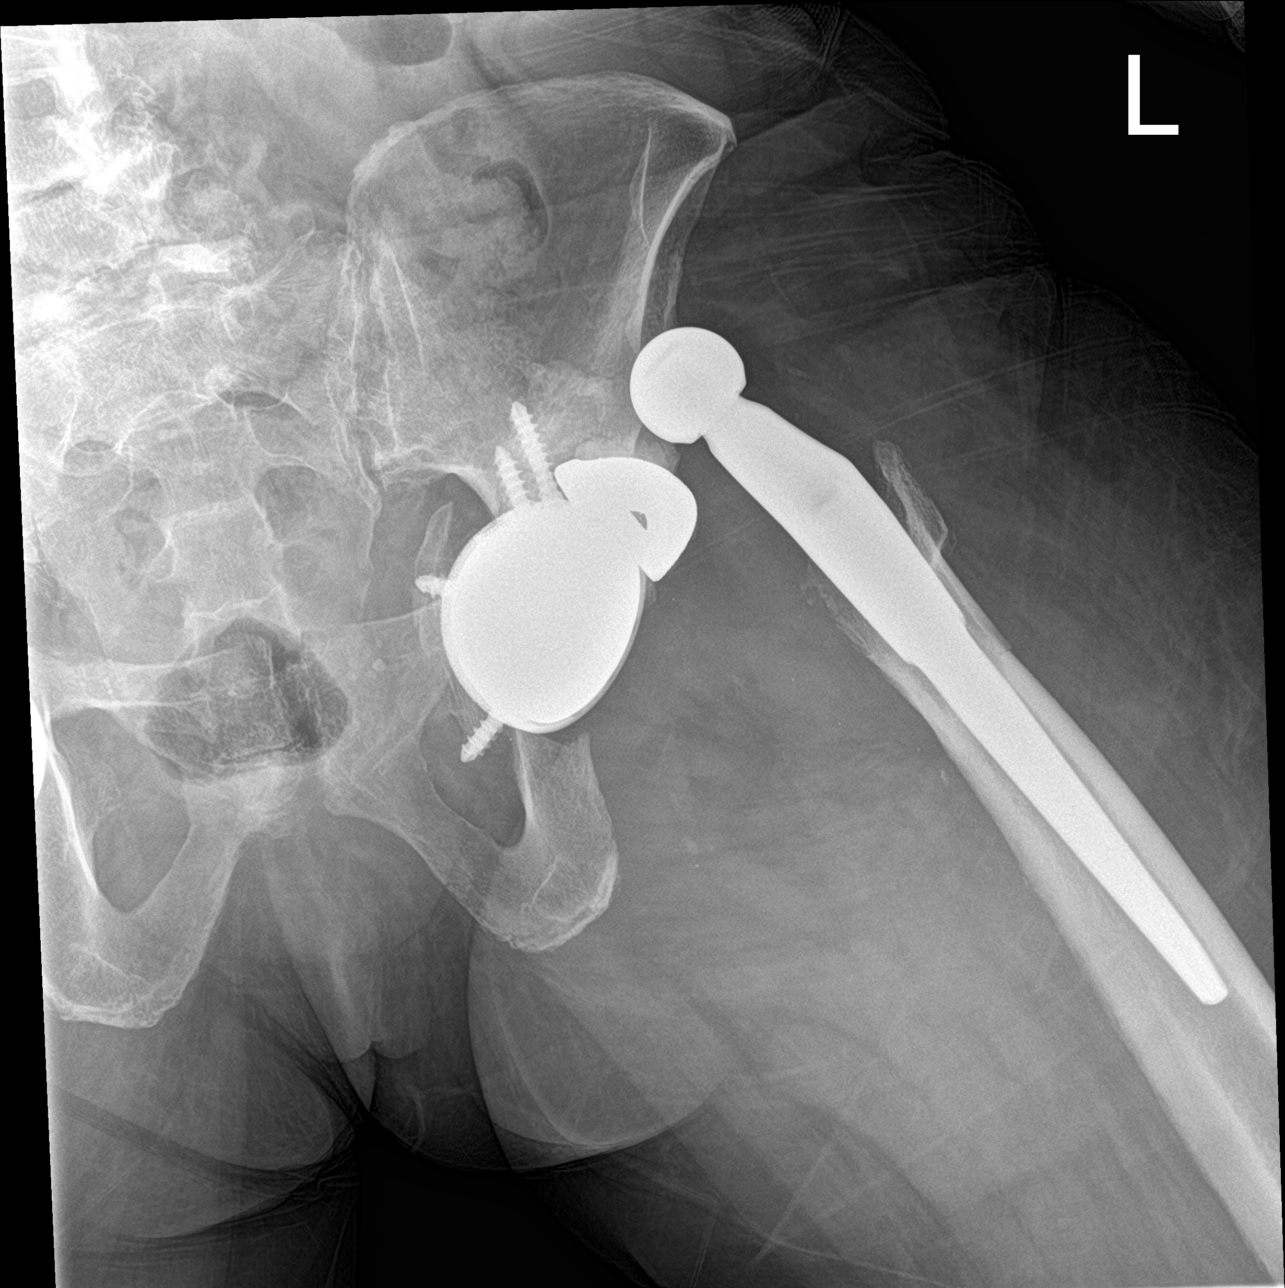

[3 of 3 positions shown; findings below may reference images not displayed]

FINDINGS: Recurrent dislocation of the femoral component of the left hip
arthroplasty. The component is lateral and superior to the revised
acetabular cup. Similar left acetabular protrusio and prior
acetabular fracture.
IMPRESSION: Recurrent dislocation of the femoral component of the left hip
arthroplasty.

## 2021-11-27 IMAGING — DX DG HIP (WITH OR WITHOUT PELVIS) 1V*L*
2 series · 2 of 2 positions shown · non-contrast
Comparison: Radiograph 05/29/2020

CLINICAL DATA: Left hip dislocation, post reduction

EXAM:
DG HIP (WITH OR WITHOUT PELVIS) 1V*L*

[pelvis ap]
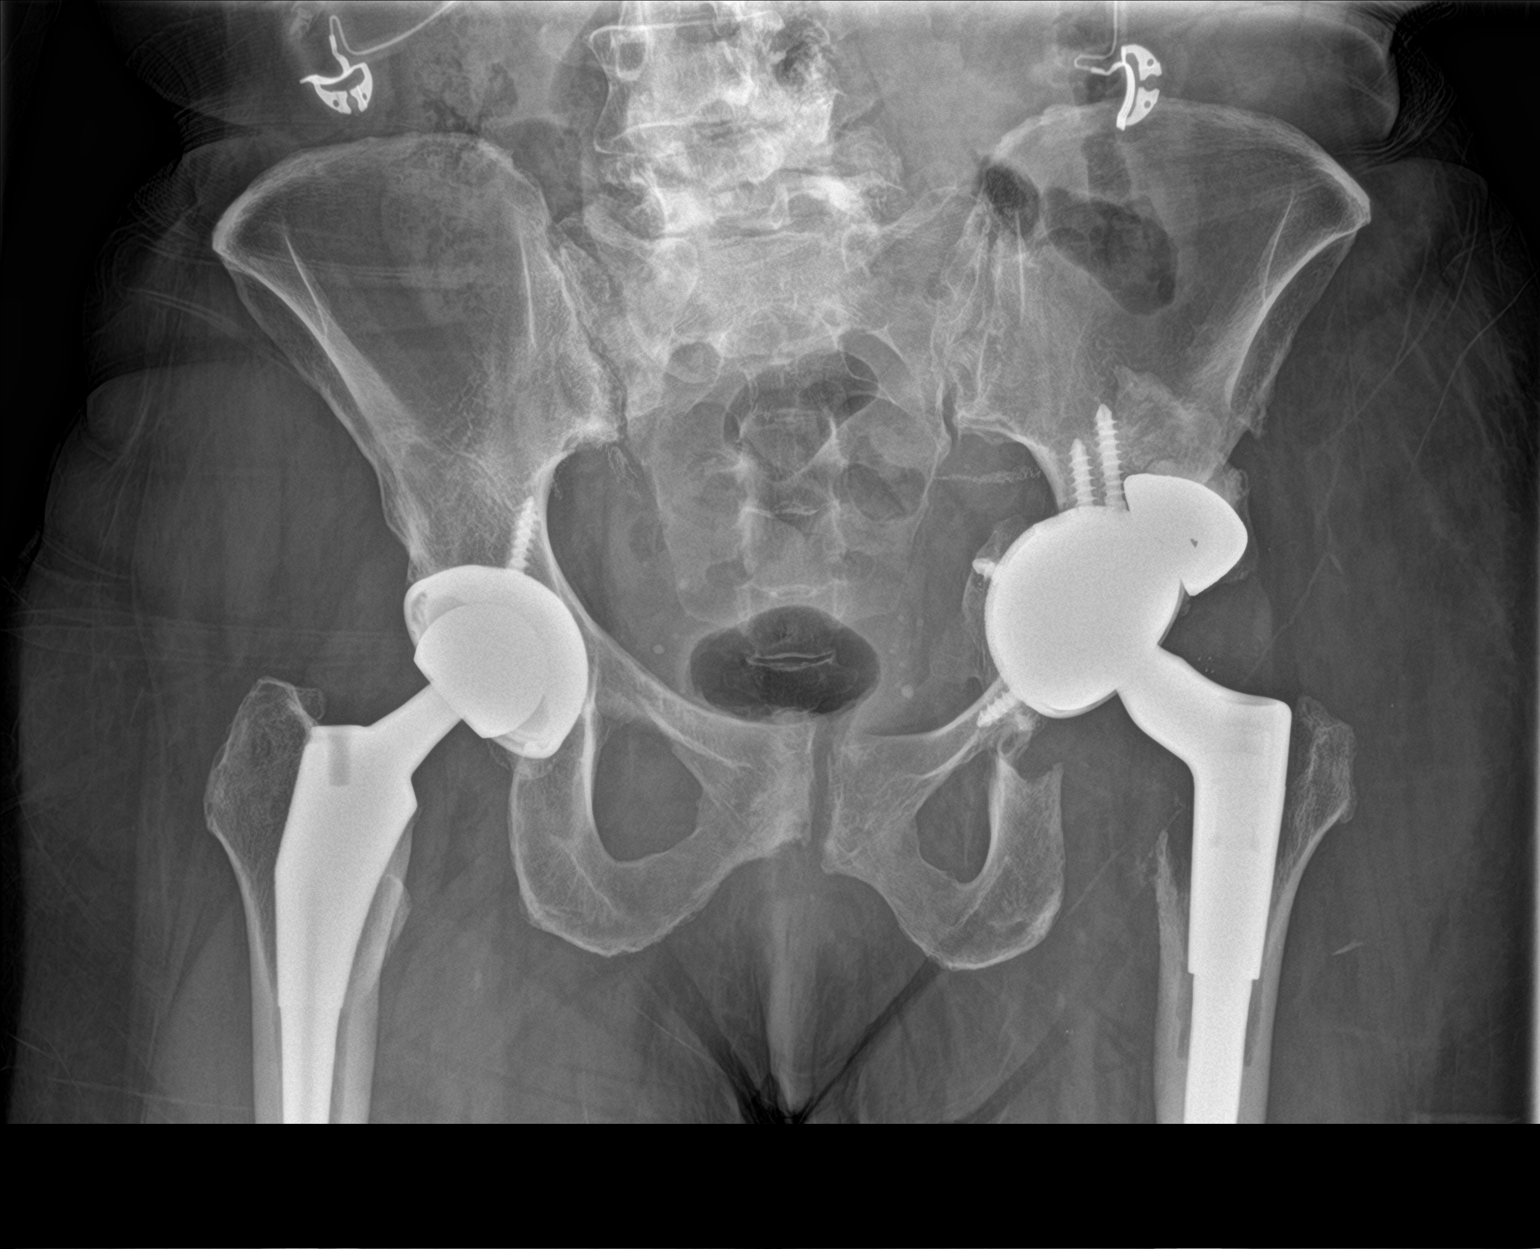

[hip lat]
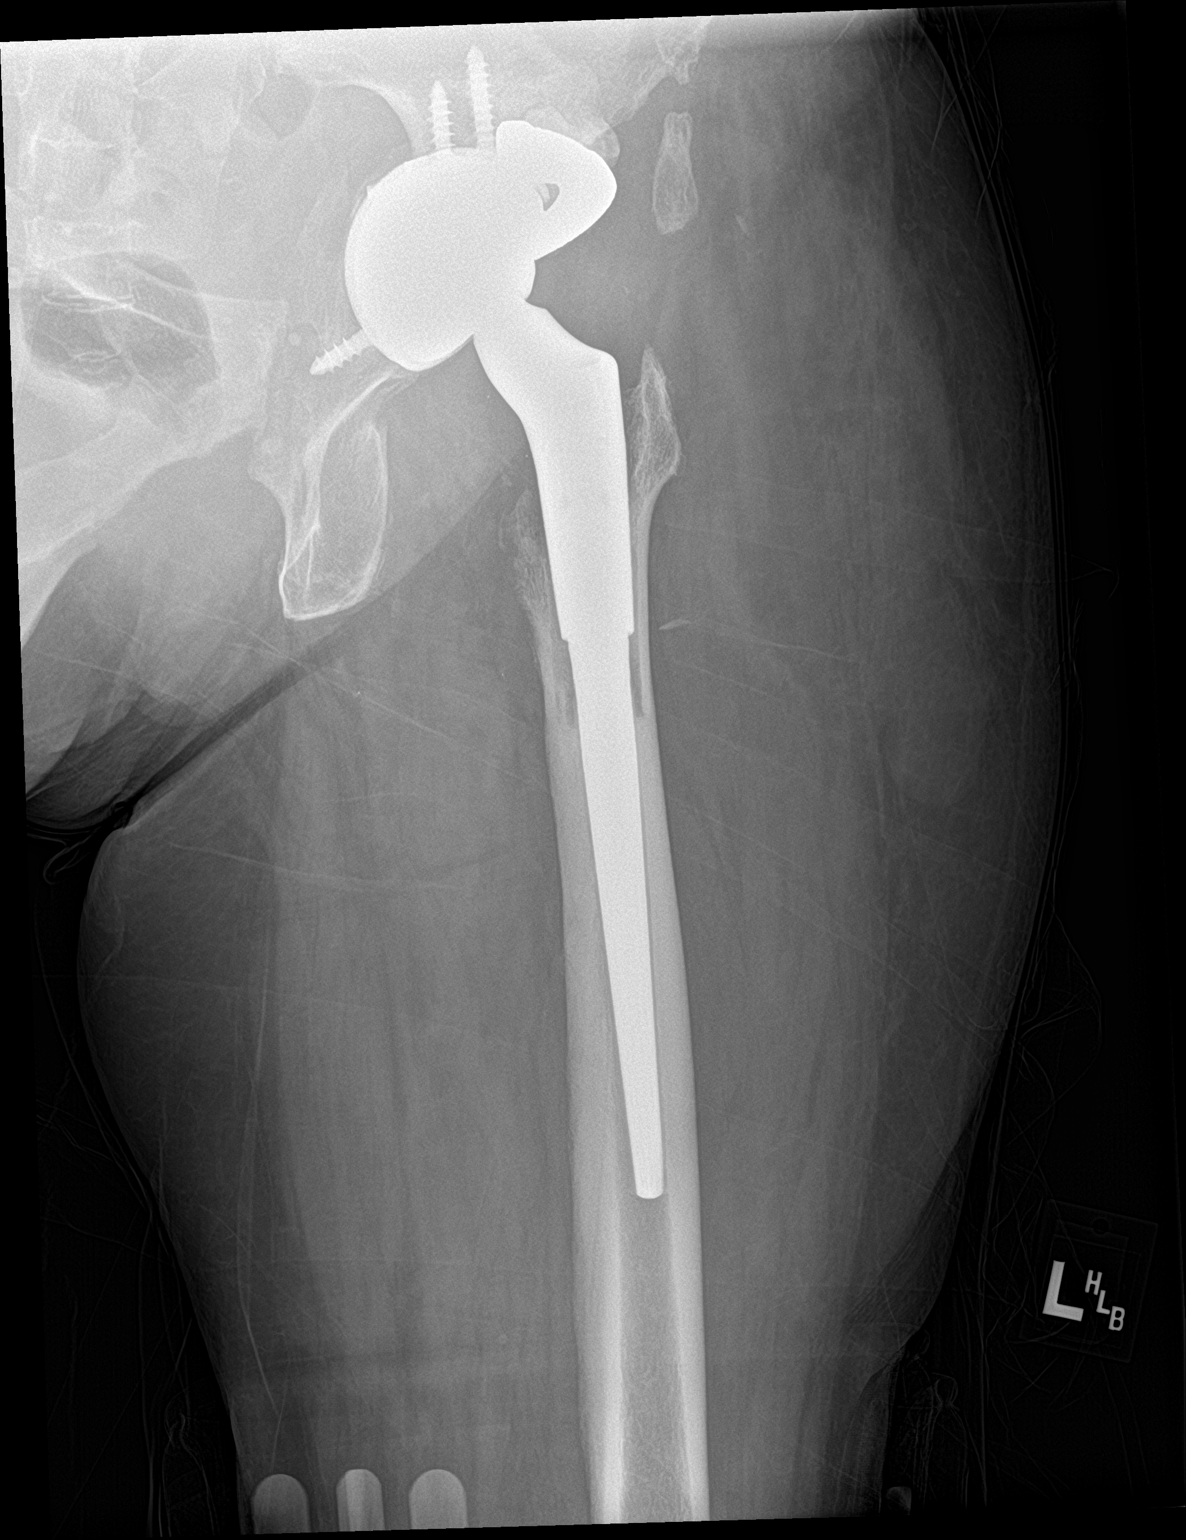

[2 of 2 positions shown; findings below may reference images not displayed]

FINDINGS: Successful interval relocation of the articulating left femoral
component into the screw fixed acetabular cup. Extensive changes
from prior resection and revision of the left hip arthroplasty are
again noted in comparison to more remote imaging from Friday November, 2019
including partial resection of the pubic root and ilium on the left
with marked degree of acetabular protrusio as well as additional
lucency and resection along the left femoral stem. A right hip
arthroplasty is unchanged in appearance from prior. Additional
degenerative changes in the lumbar spine and SI joints and symphysis
pubis.
IMPRESSION: 1. Successful interval relocation of the articulating left femoral
component into the screw fixed acetabular cup.
2. Extensive changes from prior resection and revision of the left
hip arthroplasty with acetabular protrusio.
3. Right hip arthroplasty is unchanged priors.
4. Additional degenerative changes in the lumbar spine and pelvis.

## 2021-12-07 DIAGNOSIS — Z20822 Contact with and (suspected) exposure to covid-19: Secondary | ICD-10-CM | POA: Diagnosis not present

## 2021-12-24 DIAGNOSIS — M25352 Other instability, left hip: Secondary | ICD-10-CM | POA: Diagnosis not present

## 2021-12-24 DIAGNOSIS — Z5181 Encounter for therapeutic drug level monitoring: Secondary | ICD-10-CM | POA: Diagnosis not present

## 2021-12-24 DIAGNOSIS — Z87891 Personal history of nicotine dependence: Secondary | ICD-10-CM | POA: Diagnosis not present

## 2021-12-24 DIAGNOSIS — M25552 Pain in left hip: Secondary | ICD-10-CM | POA: Diagnosis not present

## 2021-12-24 DIAGNOSIS — M351 Other overlap syndromes: Secondary | ICD-10-CM | POA: Diagnosis not present

## 2021-12-24 DIAGNOSIS — S73005A Unspecified dislocation of left hip, initial encounter: Secondary | ICD-10-CM | POA: Diagnosis not present

## 2021-12-24 DIAGNOSIS — Z96642 Presence of left artificial hip joint: Secondary | ICD-10-CM | POA: Diagnosis not present

## 2021-12-24 DIAGNOSIS — D649 Anemia, unspecified: Secondary | ICD-10-CM | POA: Diagnosis not present

## 2021-12-24 DIAGNOSIS — Z79899 Other long term (current) drug therapy: Secondary | ICD-10-CM | POA: Diagnosis not present

## 2021-12-24 DIAGNOSIS — I10 Essential (primary) hypertension: Secondary | ICD-10-CM | POA: Diagnosis not present

## 2021-12-24 DIAGNOSIS — M24452 Recurrent dislocation, left hip: Secondary | ICD-10-CM | POA: Diagnosis not present

## 2021-12-25 IMAGING — DX DG HIP (WITH OR WITHOUT PELVIS) 1V PORT*L*
1 series · 1 of 1 positions shown · non-contrast
Comparison: Earlier today

CLINICAL DATA: Status post reduction of left hip

EXAM:
DG HIP (WITH OR WITHOUT PELVIS) 1V PORT LEFT

[pelvis]
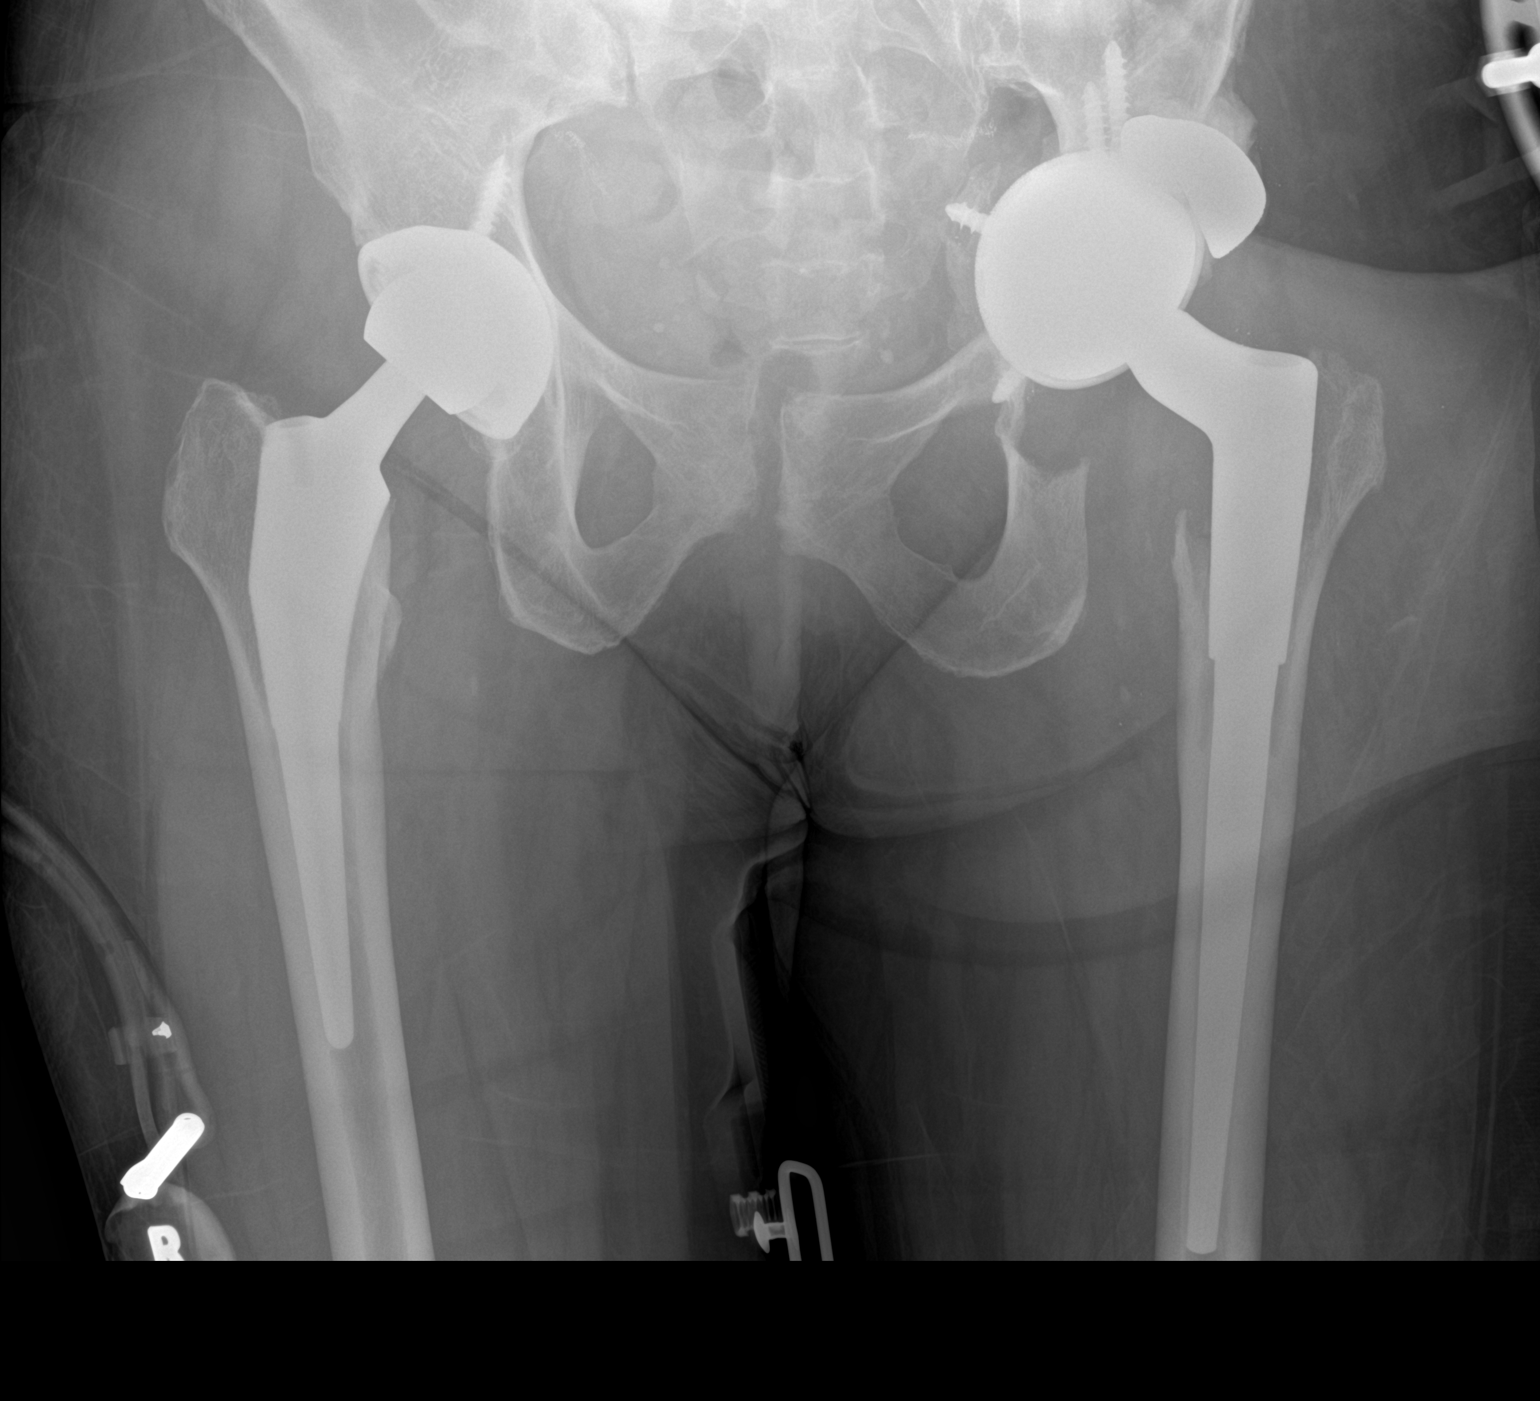

[1 of 1 positions shown; findings below may reference images not displayed]

FINDINGS: There is been interval reduction of the left femoral prosthesis.
Again seen are extensive erosive changes around the femoral
prosthesis as well as the left pubic ramus. No acute fractures
identified. The right hip prosthesis is normally located.
IMPRESSION: Interval reduction of left femoral prosthesis.

## 2021-12-25 IMAGING — DX DG HIP (WITH OR WITHOUT PELVIS) 2-3V*L*
2 series · 2 of 2 positions shown · non-contrast
Comparison: 06/09/2020

CLINICAL DATA: Left hip dislocations.

EXAM:
DG HIP (WITH OR WITHOUT PELVIS) 2-3V LEFT

[pelvis ap]
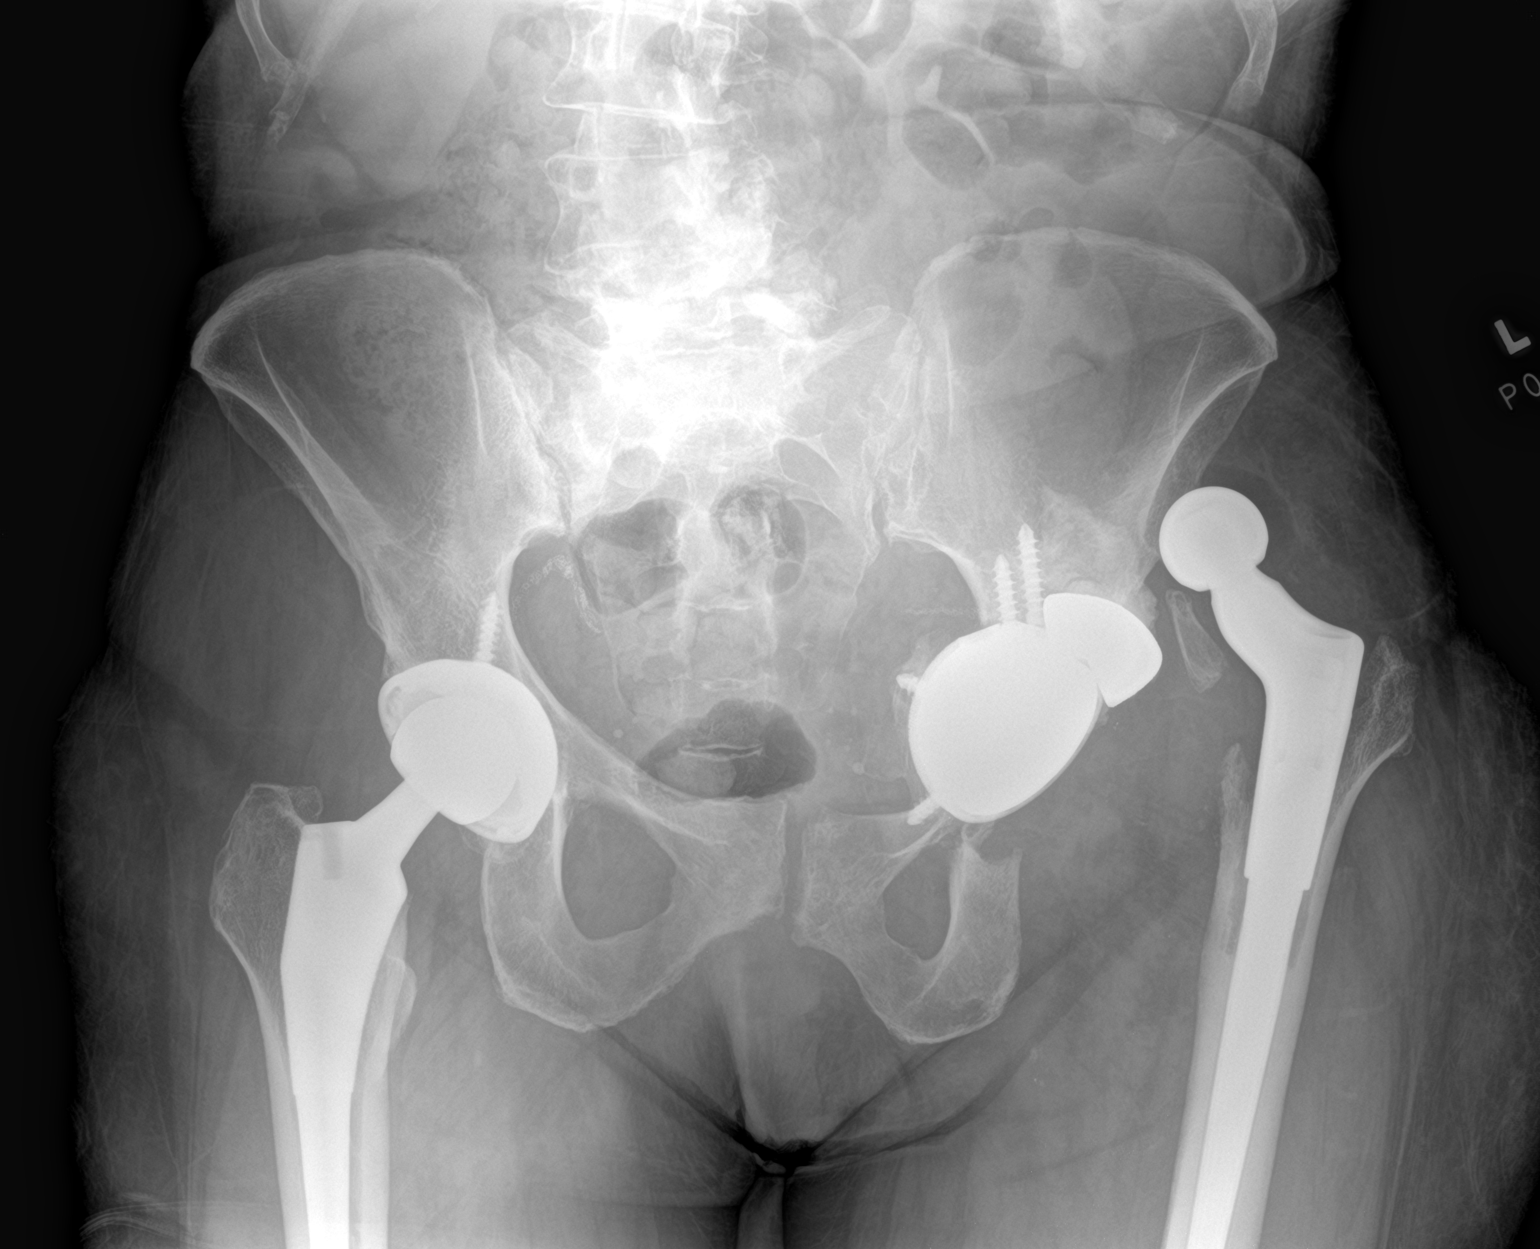

[hip x-table]
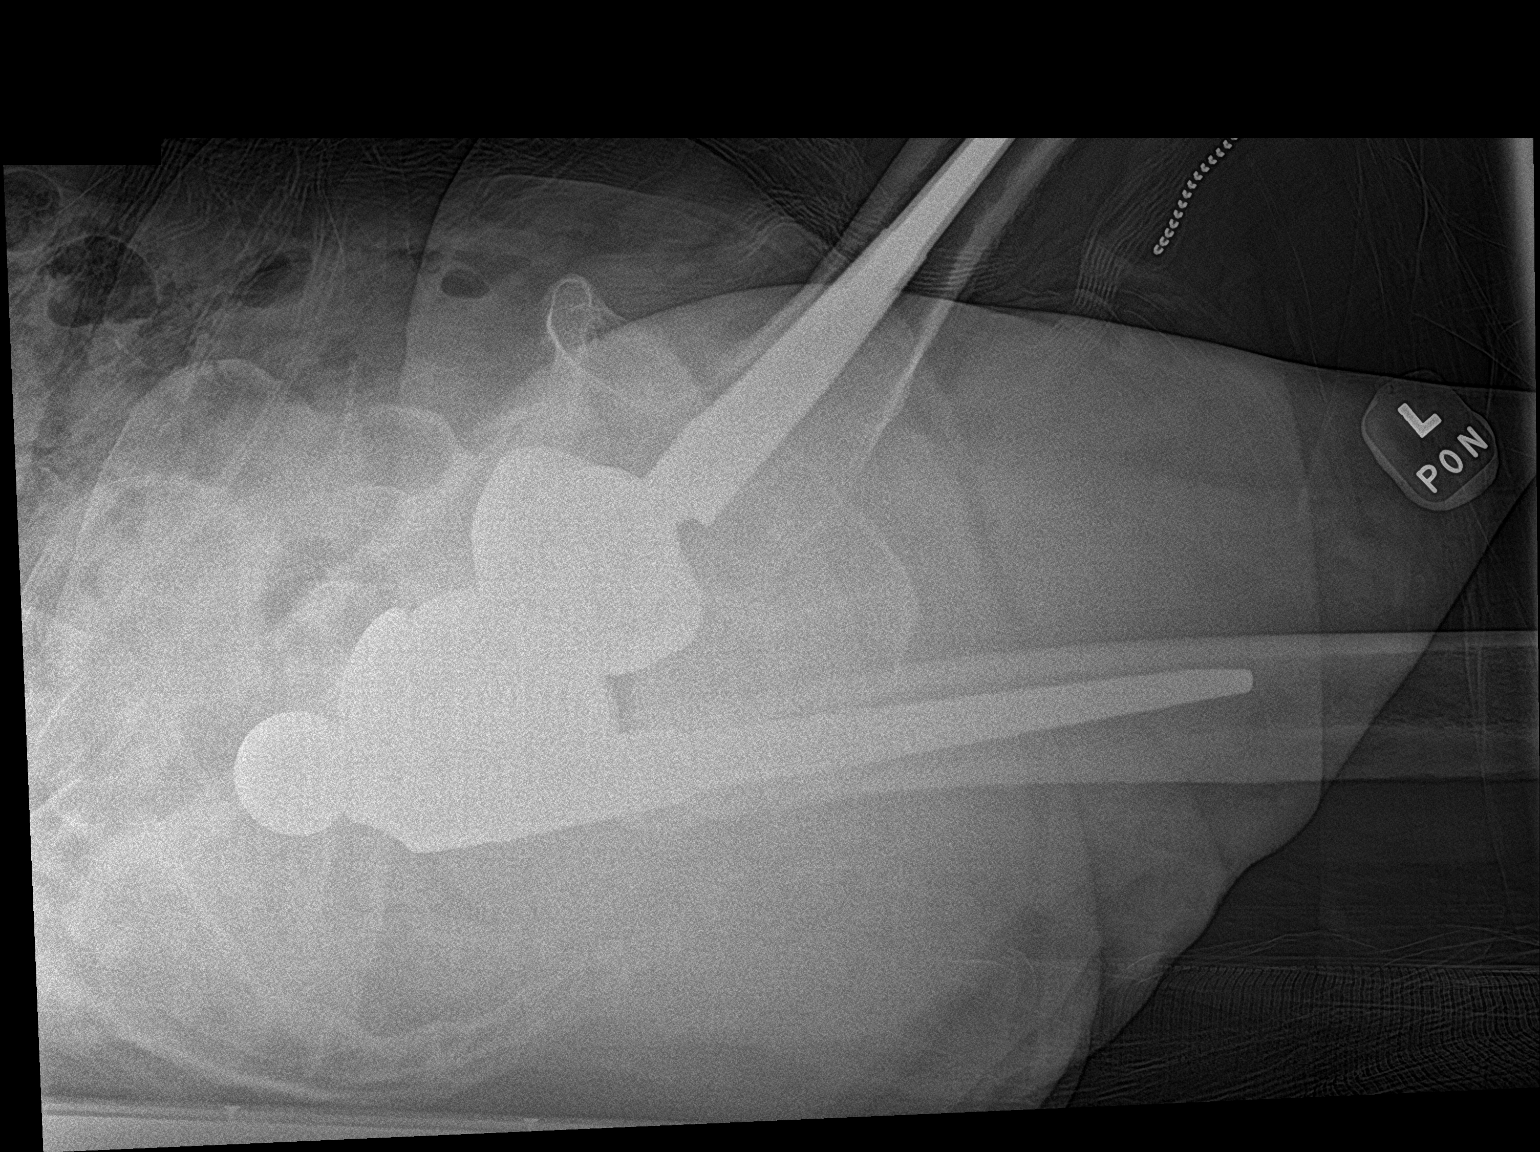

[2 of 2 positions shown; findings below may reference images not displayed]

FINDINGS: The left femoral prosthesis is dislocated superiorly and
posteriorly. Stable extensive erosive changes around the femoral
prosthesis and also involving the left pubic ramus. No acute
fracture. The right hip prosthesis is normally located.
IMPRESSION: Superior and posterior dislocation of the left femoral prosthesis.

## 2022-01-04 DIAGNOSIS — W1839XD Other fall on same level, subsequent encounter: Secondary | ICD-10-CM | POA: Diagnosis not present

## 2022-01-04 DIAGNOSIS — Z96642 Presence of left artificial hip joint: Secondary | ICD-10-CM | POA: Diagnosis not present

## 2022-01-04 DIAGNOSIS — S32592D Other specified fracture of left pubis, subsequent encounter for fracture with routine healing: Secondary | ICD-10-CM | POA: Diagnosis not present

## 2022-01-26 DIAGNOSIS — F902 Attention-deficit hyperactivity disorder, combined type: Secondary | ICD-10-CM | POA: Diagnosis not present

## 2022-02-01 DIAGNOSIS — Z96641 Presence of right artificial hip joint: Secondary | ICD-10-CM | POA: Diagnosis not present

## 2022-02-01 DIAGNOSIS — M25352 Other instability, left hip: Secondary | ICD-10-CM | POA: Diagnosis not present

## 2022-02-01 DIAGNOSIS — Z96649 Presence of unspecified artificial hip joint: Secondary | ICD-10-CM | POA: Diagnosis not present

## 2022-02-01 DIAGNOSIS — Z96642 Presence of left artificial hip joint: Secondary | ICD-10-CM | POA: Diagnosis not present

## 2022-02-22 DIAGNOSIS — G479 Sleep disorder, unspecified: Secondary | ICD-10-CM | POA: Diagnosis not present

## 2022-02-22 DIAGNOSIS — R29898 Other symptoms and signs involving the musculoskeletal system: Secondary | ICD-10-CM | POA: Diagnosis not present

## 2022-02-22 DIAGNOSIS — F3341 Major depressive disorder, recurrent, in partial remission: Secondary | ICD-10-CM | POA: Diagnosis not present

## 2022-02-22 DIAGNOSIS — M25552 Pain in left hip: Secondary | ICD-10-CM | POA: Diagnosis not present

## 2022-02-22 DIAGNOSIS — E039 Hypothyroidism, unspecified: Secondary | ICD-10-CM | POA: Diagnosis not present

## 2022-03-02 ENCOUNTER — Ambulatory Visit (HOSPITAL_BASED_OUTPATIENT_CLINIC_OR_DEPARTMENT_OTHER): Payer: Medicare Other | Attending: Family Medicine | Admitting: Physical Therapy

## 2022-03-02 ENCOUNTER — Encounter (HOSPITAL_BASED_OUTPATIENT_CLINIC_OR_DEPARTMENT_OTHER): Payer: Self-pay | Admitting: Physical Therapy

## 2022-03-02 ENCOUNTER — Other Ambulatory Visit: Payer: Self-pay

## 2022-03-02 DIAGNOSIS — M545 Low back pain, unspecified: Secondary | ICD-10-CM | POA: Diagnosis not present

## 2022-03-02 DIAGNOSIS — M25511 Pain in right shoulder: Secondary | ICD-10-CM | POA: Diagnosis not present

## 2022-03-02 DIAGNOSIS — G8929 Other chronic pain: Secondary | ICD-10-CM | POA: Diagnosis not present

## 2022-03-02 DIAGNOSIS — M25551 Pain in right hip: Secondary | ICD-10-CM | POA: Diagnosis not present

## 2022-03-02 DIAGNOSIS — M25552 Pain in left hip: Secondary | ICD-10-CM

## 2022-03-02 NOTE — Therapy (Signed)
OUTPATIENT PHYSICAL THERAPY SHOULDER EVALUATION   Patient Name: Virginia Gordon MRN: [id number] DOB:July 13, 1951, 71 y.o., female Today's Date: 03/03/2022   PT End of Session - 03/02/22 1316     Visit Number 1    Number of Visits 16    Date for PT Re-Evaluation 04/27/22    PT Start Time 1300    PT Stop Time 1343    PT Time Calculation (min) 43 min    Activity Tolerance Patient tolerated treatment well    Behavior During Therapy [facility] for tasks assessed/performed             Past Medical History:  Diagnosis Date   Rheumatoid arthritis ([place])    Past Surgical History:  Procedure Laterality Date   BREAST BIOPSY Left    pt not sure of date.   HIP SURGERY     SHOULDER SURGERY     Patient Active Problem List   Diagnosis Date Noted   Effusion of left knee joint 08/17/2021   ADD (attention deficit disorder) 11/13/2015   Connective tissue disease overlap syndrome ([place]) 11/13/2015   Menopausal and perimenopausal disorder 11/13/2015   Acne erythematosa 11/13/2015   Disordered sleep 11/13/2015   History of operative procedure on hip 07/03/2015   Left knee pain 05/20/2014   Right foot pain 05/14/2014   Bilateral shoulder pain 05/14/2014   Hypothyroidism 03/08/2014   Degenerative arthritis of hip 03/07/2012    PCP: [name]   REFERRING PROVIDER: [name] MD   REFERRING DIAG: R29.898 (ICD-10-CM) - Other symptoms and signs involving the musculoskeletal system  THERAPY DIAG:  Chronic right shoulder pain  Chronic bilateral low back pain without sciatica  Pain in right hip  Pain in left hip  Rationale for Evaluation and Treatment Rehabilitation  ONSET DATE: Revision 01/04/2022 Initial surgery was March 7th    SUBJECTIVE:                                                                                                                                                                                      SUBJECTIVE STATEMENT: Patient has a long history of hip and  shoulder pain. She has had 6 surgeries on the left hip starting in 2001. She had a replacement that got infected. She also has pain in the right side of her back and into her right hip. She uses a cane for ambulation. She had 2 RTC repairs on the right side. It is now completely torn. She has limited fucntional use of the arm. In March of 2023 she dislocated her hip again. She had another revision with a cage placed. In June she had another dislocation of  the hip but she was able to have it reduced without surgery. She is currently starting to walk with a walker and has been able to get into the pool and start swimming.    PERTINENT HISTORY: Charcott foot, Left hip replacement and revision; right hip revision, Scoliosis, DDD, Lumbar compression fracture; Right Shoulder RTC tear( repaired 2x) Rheumatoid arthritis ( although tests were negative; R foot pain;   PAIN:  Are you having pain? Yes: NPRS scale: 5-6/10 at worst at rest no pain  Pain location: Bilateral shoulders  Pain description: aching  Aggravating factors: use of her shoulders and pressure  Relieving factors: resr    PRECAUTIONS: No work on the hip at this time   WEIGHT BEARING RESTRICTIONS Yes WBAT   FALLS:  Has patient fallen in last 6 months? Yes. Number of falls had 1 fall prior to her dislocation Since the surgery no falls  No LIVING ENVIRONMENT: No steps at her house  OCCUPATION: Retried   PLOF: Requires assistive device for independence  PATIENT GOALS   OBJECTIVE:   DIAGNOSTIC FINDINGS:  Has had frequent follow up x-rays. Per patient everything looks good   PATIENT SURVEYS:  FOTO    COGNITION:  Overall cognitive status: Within functional limits for tasks assessed     SENSATION: WFL  POSTURE: Raised shoulders bilateral   UPPER EXTREMITY ROM:   MMT ROM Right eval Left eval  Shoulder flexion 4/5 4+/5  Shoulder extension    Shoulder abduction    Shoulder adduction    Shoulder internal rotation  4+/5 4+/5  Shoulder external rotation 3+/5 3+/5   Elbow flexion    Elbow extension    Wrist flexion    Wrist extension    Wrist ulnar deviation    Wrist radial deviation    Wrist pronation    Wrist supination    (Blank rows = not tested)  UPPER EXTREMITY MMT:  AROM  Right eval Left eval  Shoulder flexion 94 140  Shoulder extension    Shoulder abduction    Shoulder adduction    Shoulder internal rotation Full  Full   Shoulder external rotation Can reach behind her head but needs to put her head forward  Can reach behind her head but needs to put her head forward  Middle trapezius    Lower trapezius    Elbow flexion    Elbow extension    Wrist flexion    Wrist extension    Wrist ulnar deviation    Wrist radial deviation    Wrist pronation    Wrist supination    Grip strength (lbs)    (Blank rows = not tested)  SHOULDER SPECIAL TESTS:    PALPATION:  Tenderness to palpation in the upper back    TODAY'S TREATMENT:     PATIENT EDUCATION: Education details: reviewed HEP; symptom management; importance of following hip precautions and recommendations  Person educated: Patient Education method: Explanation Education comprehension: verbalized understanding, returned demonstration, verbal cues required, tactile cues required, and needs further education   HOME EXERCISE PROGRAM:   ASSESSMENT:  CLINICAL IMPRESSION: Patient is a 71 y.o.female who was seen today for physical therapy evaluation and treatment for bilateral shoulder pain. She has also had a hip dilocation with subsequent revision. The intial revision was performed on  10/06/2021. On 6/5 it dislocated again but she was able to have it reduced without surgery. At this time the MD does not want therapy for her hip . She has had an increase in  pain in her shoulders and progressive loss of flexion of her right shoulder since she has been using the walker. She also has pain and tightness in her lower back which has  increased with activity. She would benefit from skilled therapy to improve function in her shoulders and to reduce pain in her shoulders and back to imprve functional mobility.    OBJECTIVE IMPAIRMENTS Abnormal gait, decreased activity tolerance, decreased balance, decreased mobility, difficulty walking, decreased ROM, decreased strength, increased fascial restrictions, impaired UE functional use, and pain.   ACTIVITY LIMITATIONS carrying, lifting, bending, standing, squatting, stairs, transfers, bed mobility, reach over head, and locomotion level  PARTICIPATION LIMITATIONS: meal prep, cleaning, driving, shopping, community activity, occupation, and yard work  PERSONAL FACTORS 3+ comorbidities:  hip dislocation RTC tears; scoliosis; DDD of multiple joints; comression fx of the spine   are also affecting patient's functional outcome.   REHAB POTENTIAL: Good  CLINICAL DECISION MAKING: Unstable/unpredictable hip has dislocated since surgery; flutuating pain levels   EVALUATION COMPLEXITY: High   GOALS: Goals reviewed with patient? Yes  SHORT TERM GOALS: Target date: 03/31/2022  (Remove Blue Hyperlink)  Patient will increase right active shoulder flexion to 100 degree s Baseline: Goal status: INITIAL  2.  Patient will increase gross right shoulder strength by 5 degrees  Baseline:  Goal status: INITIAL  3.  Patient will report a 50% reduction of pain in her shoulders while using her walker  Baseline:  Goal status: INITIAL    LONG TERM GOALS: Target date: 04/28/2022  (Remove Blue Hyperlink)  Patent will be independent with an UE shoulder strength and stretching program  Baseline:  Goal status: INITIAL  2.  Patient will use both arms for mobility and ADL's with a 75% self reported reduction in pain Baseline:  Goal status: INITIAL  3.  Therapy will develop LTG's based on when she is cleared by ortho to begin hip therapy    PLAN: PT FREQUENCY: 2x/week  PT DURATION: 8  weeks  PLANNED INTERVENTIONS: Therapeutic exercises, Therapeutic activity, Neuromuscular re-education, Balance training, Gait training, Patient/Family education, Self Care, Joint mobilization, Aquatic Therapy, Dry Needling, Electrical stimulation, Cryotherapy, Moist heat, Taping, and Manual therapy  PLAN FOR NEXT SESSION: in the pool and land work on ROM, scap stability shoulder strengthening; manual therapy to the neck. Add in general mobility when cleared for the hip   Carney Living, PT 03/03/2022, 3:09 PM

## 2022-03-03 ENCOUNTER — Encounter (HOSPITAL_BASED_OUTPATIENT_CLINIC_OR_DEPARTMENT_OTHER): Payer: Self-pay | Admitting: Physical Therapy

## 2022-03-04 ENCOUNTER — Encounter (HOSPITAL_BASED_OUTPATIENT_CLINIC_OR_DEPARTMENT_OTHER): Payer: Self-pay | Admitting: Physical Therapy

## 2022-03-04 ENCOUNTER — Ambulatory Visit (HOSPITAL_BASED_OUTPATIENT_CLINIC_OR_DEPARTMENT_OTHER): Payer: Medicare Other | Admitting: Physical Therapy

## 2022-03-04 DIAGNOSIS — M545 Low back pain, unspecified: Secondary | ICD-10-CM | POA: Diagnosis not present

## 2022-03-04 DIAGNOSIS — G8929 Other chronic pain: Secondary | ICD-10-CM | POA: Diagnosis not present

## 2022-03-04 DIAGNOSIS — M25551 Pain in right hip: Secondary | ICD-10-CM | POA: Diagnosis not present

## 2022-03-04 DIAGNOSIS — M25552 Pain in left hip: Secondary | ICD-10-CM | POA: Diagnosis not present

## 2022-03-04 DIAGNOSIS — M25511 Pain in right shoulder: Secondary | ICD-10-CM | POA: Diagnosis not present

## 2022-03-04 NOTE — Therapy (Signed)
OUTPATIENT PHYSICAL THERAPY SHOULDER TREATMENT NOTE   Patient Name: Virginia Gordon MRN: [id number] DOB:Aug 13, 1950, 71 y.o., female Today's Date: 03/04/2022   PT End of Session - 03/04/22 1205     Visit Number 2    Number of Visits 16    Date for PT Re-Evaluation 04/27/22    PT Start Time 1203    PT Stop Time 1241    PT Time Calculation (min) 38 min    Activity Tolerance Patient tolerated treatment well    Behavior During Therapy [facility] for tasks assessed/performed             Past Medical History:  Diagnosis Date   Rheumatoid arthritis ([place])    Past Surgical History:  Procedure Laterality Date   BREAST BIOPSY Left    pt not sure of date.   HIP SURGERY     SHOULDER SURGERY     Patient Active Problem List   Diagnosis Date Noted   Effusion of left knee joint 08/17/2021   ADD (attention deficit disorder) 11/13/2015   Connective tissue disease overlap syndrome ([place]) 11/13/2015   Menopausal and perimenopausal disorder 11/13/2015   Acne erythematosa 11/13/2015   Disordered sleep 11/13/2015   History of operative procedure on hip 07/03/2015   Left knee pain 05/20/2014   Right foot pain 05/14/2014   Bilateral shoulder pain 05/14/2014   Hypothyroidism 03/08/2014   Degenerative arthritis of hip 03/07/2012    PCP: [name]   REFERRING PROVIDER: [name] MD   REFERRING DIAG: R29.898 (ICD-10-CM) - Other symptoms and signs involving the musculoskeletal system  THERAPY DIAG:  Chronic right shoulder pain  Chronic bilateral low back pain without sciatica  Rationale for Evaluation and Treatment Rehabilitation  ONSET DATE: Hip Revision 01/04/2022 Initial surgery was March 7th    SUBJECTIVE:                                                                                                                                                                                      SUBJECTIVE STATEMENT: Pt reports no new changes since last PT visit.    PERTINENT  HISTORY: Charcott foot, Left hip replacement and revision; right hip revision, Scoliosis, DDD, Lumbar compression fracture; Right Shoulder RTC tear( repaired 2x) Rheumatoid arthritis ( although tests were negative; R foot pain;   PAIN:  Are you having pain? Yes: NPRS scale: 4-5/10 at worst at rest no pain  Pain location: back, hip Pain description: aching  Aggravating factors: use of her shoulders and pressure  Relieving factors: rest    PRECAUTIONS: No work on the hip at this time   WEIGHT BEARING RESTRICTIONS Yes WBAT  FALLS:  Has patient fallen in last 6 months? Yes. Number of falls had 1 fall prior to her dislocation Since the surgery no falls  No LIVING ENVIRONMENT: No steps at her house  OCCUPATION: Retried   PLOF: Requires assistive device for independence  PATIENT GOALS   OBJECTIVE:   DIAGNOSTIC FINDINGS:  Has had frequent follow up x-rays. Per patient everything looks good   PATIENT SURVEYS:  FOTO    COGNITION:  Overall cognitive status: Within functional limits for tasks assessed     SENSATION: WFL  POSTURE: Raised shoulders bilateral   UPPER EXTREMITY ROM:   MMT ROM Right eval Left eval  Shoulder flexion 4/5 4+/5  Shoulder extension    Shoulder abduction    Shoulder adduction    Shoulder internal rotation 4+/5 4+/5  Shoulder external rotation 3+/5 3+/5   Elbow flexion    Elbow extension    Wrist flexion    Wrist extension    Wrist ulnar deviation    Wrist radial deviation    Wrist pronation    Wrist supination    (Blank rows = not tested)  UPPER EXTREMITY MMT:  AROM  Right eval Left eval  Shoulder flexion 94 140  Shoulder extension    Shoulder abduction    Shoulder adduction    Shoulder internal rotation Full  Full   Shoulder external rotation Can reach behind her head but needs to put her head forward  Can reach behind her head but needs to put her head forward  Middle trapezius    Lower trapezius    Elbow flexion    Elbow  extension    Wrist flexion    Wrist extension    Wrist ulnar deviation    Wrist radial deviation    Wrist pronation    Wrist supination    Grip strength (lbs)    (Blank rows = not tested)  SHOULDER SPECIAL TESTS:    PALPATION:  Tenderness to palpation in the upper back    TODAY'S TREATMENT:  Pt seen for aquatic therapy today.  Treatment took place in water 3.25-4.5 ft in depth at the [facility]. Temp of water was 91.  Pt entered/exited the pool via stairs independently with bilat rail.  * walking forward with hands at side (fingers together, as paddles)- limited distance * Walking backward with red paddles on hands, arms neutral * standing in wide stance:  Aqua jogger gloves-  bilat shoulder flexion (to 90) to neutral; bicep/tricep; IR/ER;  bilat UE on kick board moving arms/ trunk forward like table slide * seated on yellow noodle for relief in hip/pelvis:  bilat shoulder abdct/ add, shoulder horiz abdct/ add; shoulder ext/ flexion, alternating arms  Pt requires the buoyancy and hydrostatic pressure of water for support, and to offload joints by unweighting joint load by at least 50 % in navel deep water and by at least 75-80% in chest to neck deep water.  Viscosity of the water is needed for resistance of strengthening. Water current perturbations provides challenge to standing balance requiring increased core activation.     PATIENT EDUCATION: Education details: reviewed HEP; symptom management; importance of following hip precautions and recommendations  Person educated: Patient Education method: Explanation Education comprehension: verbalized understanding, returned demonstration, verbal cues required, tactile cues required, and needs further education   HOME EXERCISE PROGRAM: TBA  ASSESSMENT:  CLINICAL IMPRESSION: Pt had increased hip pain with forward walking, less with backward gait.  She did well suspended on yellow noodle between  legs for relief of  LBP/ hip pain while focusing on Rt shoulder exercises; no loss of balance.  Minor cues for scap depression/retraction while arm moving. No increase in shoulder pain during session.  She would benefit from skilled therapy to improve function in her shoulders and to reduce pain in her shoulders and back to imprve functional mobility.    OBJECTIVE IMPAIRMENTS Abnormal gait, decreased activity tolerance, decreased balance, decreased mobility, difficulty walking, decreased ROM, decreased strength, increased fascial restrictions, impaired UE functional use, and pain.   ACTIVITY LIMITATIONS carrying, lifting, bending, standing, squatting, stairs, transfers, bed mobility, reach over head, and locomotion level  PARTICIPATION LIMITATIONS: meal prep, cleaning, driving, shopping, community activity, occupation, and yard work  PERSONAL FACTORS 3+ comorbidities:  hip dislocation RTC tears; scoliosis; DDD of multiple joints; comression fx of the spine   are also affecting patient's functional outcome.   REHAB POTENTIAL: Good  CLINICAL DECISION MAKING: Unstable/unpredictable hip has dislocated since surgery; flutuating pain levels   EVALUATION COMPLEXITY: High   GOALS: Goals reviewed with patient? Yes  SHORT TERM GOALS: Target date: 03/31/2022  Patient will increase right active shoulder flexion to 100 degree s Baseline: Goal status: INITIAL  2.  Patient will increase gross right shoulder strength by 5 degrees  Baseline:  Goal status: INITIAL  3.  Patient will report a 50% reduction of pain in her shoulders while using her walker  Baseline:  Goal status: INITIAL    LONG TERM GOALS: Target date: 04/28/2022    Patent will be independent with an UE shoulder strength and stretching program  Baseline:  Goal status: INITIAL  2.  Patient will use both arms for mobility and ADL's with a 75% self reported reduction in pain Baseline:  Goal status: INITIAL  3.  Therapy will develop LTG's based on  when she is cleared by ortho to begin hip therapy    PLAN: PT FREQUENCY: 2x/week  PT DURATION: 8 weeks  PLANNED INTERVENTIONS: Therapeutic exercises, Therapeutic activity, Neuromuscular re-education, Balance training, Gait training, Patient/Family education, Self Care, Joint mobilization, Aquatic Therapy, Dry Needling, Electrical stimulation, Cryotherapy, Moist heat, Taping, and Manual therapy  PLAN FOR NEXT SESSION: in the pool and land work on ROM, scap stability shoulder strengthening; manual therapy to the neck. Add in general mobility when cleared for the hip   Kerin Perna, PTA 03/04/22 2:09 PM

## 2022-03-09 ENCOUNTER — Ambulatory Visit (HOSPITAL_BASED_OUTPATIENT_CLINIC_OR_DEPARTMENT_OTHER): Payer: Medicare Other | Admitting: Physical Therapy

## 2022-03-09 ENCOUNTER — Encounter (HOSPITAL_BASED_OUTPATIENT_CLINIC_OR_DEPARTMENT_OTHER): Payer: Self-pay | Admitting: Physical Therapy

## 2022-03-09 DIAGNOSIS — R2689 Other abnormalities of gait and mobility: Secondary | ICD-10-CM

## 2022-03-09 DIAGNOSIS — M25551 Pain in right hip: Secondary | ICD-10-CM

## 2022-03-09 DIAGNOSIS — M545 Low back pain, unspecified: Secondary | ICD-10-CM | POA: Diagnosis not present

## 2022-03-09 DIAGNOSIS — M25552 Pain in left hip: Secondary | ICD-10-CM | POA: Diagnosis not present

## 2022-03-09 DIAGNOSIS — G8929 Other chronic pain: Secondary | ICD-10-CM | POA: Diagnosis not present

## 2022-03-09 DIAGNOSIS — M25511 Pain in right shoulder: Secondary | ICD-10-CM | POA: Diagnosis not present

## 2022-03-09 NOTE — Therapy (Signed)
OUTPATIENT PHYSICAL THERAPY SHOULDER TREATMENT NOTE   Patient Name: Virginia Gordon MRN: [id number] DOB:22-Oct-1950, 71 y.o., female Today's Date: 03/09/2022   PT End of Session - 03/09/22 1224     Visit Number 3    Number of Visits 16    Date for PT Re-Evaluation 04/27/22    PT Start Time 4098    PT Stop Time 1228    PT Time Calculation (min) 43 min    Activity Tolerance Patient tolerated treatment well    Behavior During Therapy [facility] for tasks assessed/performed             Past Medical History:  Diagnosis Date   Rheumatoid arthritis ([place])    Past Surgical History:  Procedure Laterality Date   BREAST BIOPSY Left    pt not sure of date.   HIP SURGERY     SHOULDER SURGERY     Patient Active Problem List   Diagnosis Date Noted   Effusion of left knee joint 08/17/2021   ADD (attention deficit disorder) 11/13/2015   Connective tissue disease overlap syndrome ([place]) 11/13/2015   Menopausal and perimenopausal disorder 11/13/2015   Acne erythematosa 11/13/2015   Disordered sleep 11/13/2015   History of operative procedure on hip 07/03/2015   Left knee pain 05/20/2014   Right foot pain 05/14/2014   Bilateral shoulder pain 05/14/2014   Hypothyroidism 03/08/2014   Degenerative arthritis of hip 03/07/2012    PCP: [name]   REFERRING PROVIDER: [name] MD   REFERRING DIAG: R29.898 (ICD-10-CM) - Other symptoms and signs involving the musculoskeletal system  THERAPY DIAG:  Chronic right shoulder pain  Chronic bilateral low back pain without sciatica  Pain in right hip  Pain in left hip  Other abnormalities of gait and mobility  Rationale for Evaluation and Treatment Rehabilitation  ONSET DATE: Hip Revision 01/04/2022 Initial surgery was March 7th    SUBJECTIVE:                                                                                                                                                                                       SUBJECTIVE STATEMENT: Pt reports she has had pain around her ischial turbeosity today. It is effecting her gait. Her shoulders have been doing OK.     PERTINENT HISTORY: Charcott foot, Left hip replacement and revision; right hip revision, Scoliosis, DDD, Lumbar compression fracture; Right Shoulder RTC tear( repaired 2x) Rheumatoid arthritis ( although tests were negative; R foot pain;   PAIN:  Are you having pain? Yes: NPRS scale: 4-5/10 at worst at rest no pain  Pain location: back, hip Pain description: aching  Aggravating factors:  use of her shoulders and pressure  Relieving factors: rest    PRECAUTIONS: No work on the hip at this time   WEIGHT BEARING RESTRICTIONS Yes WBAT   FALLS:  Has patient fallen in last 6 months? Yes. Number of falls had 1 fall prior to her dislocation Since the surgery no falls  No LIVING ENVIRONMENT: No steps at her house  OCCUPATION: Retried   PLOF: Requires assistive device for independence  PATIENT GOALS   OBJECTIVE:   DIAGNOSTIC FINDINGS:  Has had frequent follow up x-rays. Per patient everything looks good   PATIENT SURVEYS:  FOTO    COGNITION:  Overall cognitive status: Within functional limits for tasks assessed     SENSATION: WFL  POSTURE: Raised shoulders bilateral   UPPER EXTREMITY ROM:   MMT ROM Right eval Left eval  Shoulder flexion 4/5 4+/5  Shoulder extension    Shoulder abduction    Shoulder adduction    Shoulder internal rotation 4+/5 4+/5  Shoulder external rotation 3+/5 3+/5   Elbow flexion    Elbow extension    Wrist flexion    Wrist extension    Wrist ulnar deviation    Wrist radial deviation    Wrist pronation    Wrist supination    (Blank rows = not tested)  UPPER EXTREMITY MMT:  AROM  Right eval Left eval  Shoulder flexion 94 140  Shoulder extension    Shoulder abduction    Shoulder adduction    Shoulder internal rotation Full  Full   Shoulder external rotation Can reach behind  her head but needs to put her head forward  Can reach behind her head but needs to put her head forward  Middle trapezius    Lower trapezius    Elbow flexion    Elbow extension    Wrist flexion    Wrist extension    Wrist ulnar deviation    Wrist radial deviation    Wrist pronation    Wrist supination    Grip strength (lbs)    (Blank rows = not tested)  SHOULDER SPECIAL TESTS:    PALPATION:  Tenderness to palpation in the upper back    TODAY'S TREATMENT:  8/8 Pt seen for aquatic therapy today.  Treatment took place in water 3.25-4.5 ft in depth at the [place]. Temp of water was 91.  Pt entered/exited the pool via stairs independently with bilat rail.  * walking forward as warm up 3 laps  * Walking backward with red paddles on hands, arms neutral Kick board forward stretch 5 x10 sec hold  Kick board lateral movement 10x each aide Kick board press down and hold x10  Rainbow noodle: extension; quick extension and wining x20  Yellow weight reach and pull x20  Under water flexion and scaption x20 each  Pt requires the buoyancy and hydrostatic pressure of water for support, and to offload joints by unweighting joint load by at least 50 % in navel deep water and by at least 75-80% in chest to neck deep water.  Viscosity of the water is needed for resistance of strengthening. Water current perturbations provides challenge to standing balance requiring increased core activation.      Last visit  Pt seen for aquatic therapy today.  Treatment took place in water 3.25-4.5 ft in depth at the [place]. Temp of water was 91.  Pt entered/exited the pool via stairs independently with bilat rail.  * walking forward with hands at side (fingers  together, as paddles)- limited distance * Walking backward with red paddles on hands, arms neutral * standing in wide stance:  Aqua jogger gloves-  bilat shoulder flexion (to 90) to neutral; bicep/tricep; IR/ER;   bilat UE on kick board moving arms/ trunk forward like table slide * seated on yellow noodle for relief in hip/pelvis:  bilat shoulder abdct/ add, shoulder horiz abdct/ add; shoulder ext/ flexion, alternating arms  Pt requires the buoyancy and hydrostatic pressure of water for support, and to offload joints by unweighting joint load by at least 50 % in navel deep water and by at least 75-80% in chest to neck deep water.  Viscosity of the water is needed for resistance of strengthening. Water current perturbations provides challenge to standing balance requiring increased core activation.     PATIENT EDUCATION: Education details: reviewed HEP; symptom management; importance of following hip precautions and recommendations  Person educated: Patient Education method: Explanation Education comprehension: verbalized understanding, returned demonstration, verbal cues required, tactile cues required, and needs further education   HOME EXERCISE PROGRAM: TBA  ASSESSMENT:  CLINICAL IMPRESSION: Patient tolerated treatment well> We reviewed her HEP for the water. Most of the exercises are on there in some form for her shoulder. She had no major complaints. She had minor fatigue with some exercises but otherwise she tolerated well. She will see her MD Monday about her hip.  OBJECTIVE IMPAIRMENTS Abnormal gait, decreased activity tolerance, decreased balance, decreased mobility, difficulty walking, decreased ROM, decreased strength, increased fascial restrictions, impaired UE functional use, and pain.   ACTIVITY LIMITATIONS carrying, lifting, bending, standing, squatting, stairs, transfers, bed mobility, reach over head, and locomotion level  PARTICIPATION LIMITATIONS: meal prep, cleaning, driving, shopping, community activity, occupation, and yard work  PERSONAL FACTORS 3+ comorbidities:  hip dislocation RTC tears; scoliosis; DDD of multiple joints; comression fx of the spine   are also affecting  patient's functional outcome.   REHAB POTENTIAL: Good  CLINICAL DECISION MAKING: Unstable/unpredictable hip has dislocated since surgery; flutuating pain levels   EVALUATION COMPLEXITY: High   GOALS: Goals reviewed with patient? Yes  SHORT TERM GOALS: Target date: 03/31/2022  Patient will increase right active shoulder flexion to 100 degree s Baseline: Goal status: INITIAL  2.  Patient will increase gross right shoulder strength by 5 degrees  Baseline:  Goal status: INITIAL  3.  Patient will report a 50% reduction of pain in her shoulders while using her walker  Baseline:  Goal status: INITIAL    LONG TERM GOALS: Target date: 04/28/2022    Patent will be independent with an UE shoulder strength and stretching program  Baseline:  Goal status: INITIAL  2.  Patient will use both arms for mobility and ADL's with a 75% self reported reduction in pain Baseline:  Goal status: INITIAL  3.  Therapy will develop LTG's based on when she is cleared by ortho to begin hip therapy    PLAN: PT FREQUENCY: 2x/week  PT DURATION: 8 weeks  PLANNED INTERVENTIONS: Therapeutic exercises, Therapeutic activity, Neuromuscular re-education, Balance training, Gait training, Patient/Family education, Self Care, Joint mobilization, Aquatic Therapy, Dry Needling, Electrical stimulation, Cryotherapy, Moist heat, Taping, and Manual therapy  PLAN FOR NEXT SESSION: in the pool and land work on ROM, scap stability shoulder strengthening; manual therapy to the neck. Add in general mobility when cleared for the hip  Carolyne Littles PT DPT  03/09/22 1:39 PM

## 2022-03-15 DIAGNOSIS — M25352 Other instability, left hip: Secondary | ICD-10-CM | POA: Diagnosis not present

## 2022-03-15 DIAGNOSIS — X58XXXD Exposure to other specified factors, subsequent encounter: Secondary | ICD-10-CM | POA: Diagnosis not present

## 2022-03-15 DIAGNOSIS — Z96642 Presence of left artificial hip joint: Secondary | ICD-10-CM | POA: Diagnosis not present

## 2022-03-15 DIAGNOSIS — Z96649 Presence of unspecified artificial hip joint: Secondary | ICD-10-CM | POA: Diagnosis not present

## 2022-03-15 DIAGNOSIS — S32592D Other specified fracture of left pubis, subsequent encounter for fracture with routine healing: Secondary | ICD-10-CM | POA: Diagnosis not present

## 2022-03-15 DIAGNOSIS — T84115D Breakdown (mechanical) of internal fixation device of left femur, subsequent encounter: Secondary | ICD-10-CM | POA: Diagnosis not present

## 2022-03-16 ENCOUNTER — Ambulatory Visit (HOSPITAL_BASED_OUTPATIENT_CLINIC_OR_DEPARTMENT_OTHER): Payer: Medicare Other | Admitting: Physical Therapy

## 2022-03-16 ENCOUNTER — Encounter (HOSPITAL_BASED_OUTPATIENT_CLINIC_OR_DEPARTMENT_OTHER): Payer: Self-pay | Admitting: Physical Therapy

## 2022-03-16 DIAGNOSIS — M25551 Pain in right hip: Secondary | ICD-10-CM | POA: Diagnosis not present

## 2022-03-16 DIAGNOSIS — M545 Low back pain, unspecified: Secondary | ICD-10-CM

## 2022-03-16 DIAGNOSIS — M25552 Pain in left hip: Secondary | ICD-10-CM | POA: Diagnosis not present

## 2022-03-16 DIAGNOSIS — G8929 Other chronic pain: Secondary | ICD-10-CM

## 2022-03-16 DIAGNOSIS — M25511 Pain in right shoulder: Secondary | ICD-10-CM | POA: Diagnosis not present

## 2022-03-16 NOTE — Therapy (Signed)
OUTPATIENT PHYSICAL THERAPY SHOULDER TREATMENT NOTE   Patient Name: Virginia Gordon MRN: [id number] DOB:1950/10/11, 71 y.o., female Today's Date: 03/16/2022   PT End of Session - 03/16/22 1209     Visit Number 4    Number of Visits 16    Date for PT Re-Evaluation 04/27/22    PT Start Time 1205    PT Stop Time 1245    PT Time Calculation (min) 40 min    Activity Tolerance Patient tolerated treatment well    Behavior During Therapy [facility] for tasks assessed/performed             Past Medical History:  Diagnosis Date   Rheumatoid arthritis ([place])    Past Surgical History:  Procedure Laterality Date   BREAST BIOPSY Left    pt not sure of date.   HIP SURGERY     SHOULDER SURGERY     Patient Active Problem List   Diagnosis Date Noted   Effusion of left knee joint 08/17/2021   ADD (attention deficit disorder) 11/13/2015   Connective tissue disease overlap syndrome ([place]) 11/13/2015   Menopausal and perimenopausal disorder 11/13/2015   Acne erythematosa 11/13/2015   Disordered sleep 11/13/2015   History of operative procedure on hip 07/03/2015   Left knee pain 05/20/2014   Right foot pain 05/14/2014   Bilateral shoulder pain 05/14/2014   Hypothyroidism 03/08/2014   Degenerative arthritis of hip 03/07/2012    PCP: [name]   REFERRING PROVIDER: [name] MD   REFERRING DIAG: R29.898 (ICD-10-CM) - Other symptoms and signs involving the musculoskeletal system  THERAPY DIAG:  Chronic right shoulder pain  Chronic bilateral low back pain without sciatica  Pain in right hip  Pain in left hip  Rationale for Evaluation and Treatment Rehabilitation  ONSET DATE: Hip Revision 01/04/2022 Initial surgery was March 7th    SUBJECTIVE:                                                                                                                                                                                      SUBJECTIVE STATEMENT: Pt reports her pubic rami  has detatched as per [facility] MD     PERTINENT HISTORY: Charcott foot, Left hip replacement and revision; right hip revision, Scoliosis, DDD, Lumbar compression fracture; Right Shoulder RTC tear( repaired 2x) Rheumatoid arthritis ( although tests were negative; R foot pain;   PAIN:  Are you having pain? Yes: NPRS scale: 7/10 at worst at rest no pain  Pain location: back, hip Pain description: aching  Aggravating factors: use of her shoulders and pressure  Relieving factors: rest    PRECAUTIONS: No work on the  hip at this time   WEIGHT BEARING RESTRICTIONS Yes WBAT   FALLS:  Has patient fallen in last 6 months? Yes. Number of falls had 1 fall prior to her dislocation Since the surgery no falls  No LIVING ENVIRONMENT: No steps at her house  OCCUPATION: Retried   PLOF: Requires assistive device for independence  PATIENT GOALS   OBJECTIVE:   DIAGNOSTIC FINDINGS:  Has had frequent follow up x-rays. Per patient everything looks good   PATIENT SURVEYS:  FOTO    COGNITION:  Overall cognitive status: Within functional limits for tasks assessed     SENSATION: WFL  POSTURE: Raised shoulders bilateral   UPPER EXTREMITY ROM:   MMT ROM Right eval Left eval  Shoulder flexion 4/5 4+/5  Shoulder extension    Shoulder abduction    Shoulder adduction    Shoulder internal rotation 4+/5 4+/5  Shoulder external rotation 3+/5 3+/5   Elbow flexion    Elbow extension    Wrist flexion    Wrist extension    Wrist ulnar deviation    Wrist radial deviation    Wrist pronation    Wrist supination    (Blank rows = not tested)  UPPER EXTREMITY MMT:  AROM  Right eval Left eval  Shoulder flexion 94 140  Shoulder extension    Shoulder abduction    Shoulder adduction    Shoulder internal rotation Full  Full   Shoulder external rotation Can reach behind her head but needs to put her head forward  Can reach behind her head but needs to put her head forward  Middle trapezius     Lower trapezius    Elbow flexion    Elbow extension    Wrist flexion    Wrist extension    Wrist ulnar deviation    Wrist radial deviation    Wrist pronation    Wrist supination    Grip strength (lbs)    (Blank rows = not tested)  SHOULDER SPECIAL TESTS:    PALPATION:  Tenderness to palpation in the upper back    TODAY'S TREATMENT:  8/15 Pt seen for aquatic therapy today.  Treatment took place in water 3.25-4.5 ft in depth at the [place]. Temp of water was 91.  Pt entered/exited the pool via stairs independently with bilat rail.     walking forward, back and side stepping as warm up  Straddling yellow noodle: gentle cycling with breast stroke arms Kick board press down [employer] board row 2x15 Cues for abdominal bracing; glut tightening for core engagement UE: using 1 foam hand buoy horizontal add/abd; shoulder flex to neutral; add/abd to neutral; tricep curl x10-12 Lumbar rotation and stretching using noodle  Pt requires the buoyancy and hydrostatic pressure of water for support, and to offload joints by unweighting joint load by at least 50 % in navel deep water and by at least 75-80% in chest to neck deep water.  Viscosity of the water is needed for resistance of strengthening. Water current perturbations provides challenge to standing balance requiring increased core activation.     PATIENT EDUCATION: Education details: reviewed HEP; symptom management; importance of following hip precautions and recommendations  Person educated: Patient Education method: Explanation Education comprehension: verbalized understanding, returned demonstration, verbal cues required, tactile cues required, and needs further education   HOME EXERCISE PROGRAM: TBA  ASSESSMENT:  CLINICAL IMPRESSION: Patient with pubic rami detachment (as per pt).  She states MD wants little exercises/focus on hip with therapy.  Will  focus on shoulder and core strengthening.  Used hand  buoys with ue exercises today rather than aqua gloves as pt unable to find a pair to purchase on line. 1 foam hand buoys tolerated > 2 foam. She reports she has been swimming for exercise between therapy sessions (back stroke).  She is introduced to straddling noodle for variety of exercise.  She tolerates session well.   OBJECTIVE IMPAIRMENTS Abnormal gait, decreased activity tolerance, decreased balance, decreased mobility, difficulty walking, decreased ROM, decreased strength, increased fascial restrictions, impaired UE functional use, and pain.   ACTIVITY LIMITATIONS carrying, lifting, bending, standing, squatting, stairs, transfers, bed mobility, reach over head, and locomotion level  PARTICIPATION LIMITATIONS: meal prep, cleaning, driving, shopping, community activity, occupation, and yard work  PERSONAL FACTORS 3+ comorbidities:  hip dislocation RTC tears; scoliosis; DDD of multiple joints; comression fx of the spine   are also affecting patient's functional outcome.   REHAB POTENTIAL: Good  CLINICAL DECISION MAKING: Unstable/unpredictable hip has dislocated since surgery; flutuating pain levels   EVALUATION COMPLEXITY: High   GOALS: Goals reviewed with patient? Yes  SHORT TERM GOALS: Target date: 03/31/2022  Patient will increase right active shoulder flexion to 100 degree s Baseline: Goal status: INITIAL  2.  Patient will increase gross right shoulder strength by 5 degrees  Baseline:  Goal status: INITIAL  3.  Patient will report a 50% reduction of pain in her shoulders while using her walker  Baseline:  Goal status: INITIAL    LONG TERM GOALS: Target date: 04/28/2022    Patent will be independent with an UE shoulder strength and stretching program  Baseline:  Goal status: INITIAL  2.  Patient will use both arms for mobility and ADL's with a 75% self reported reduction in pain Baseline:  Goal status: INITIAL  3.  Therapy will develop LTG's based on when she is  cleared by ortho to begin hip therapy    PLAN: PT FREQUENCY: 2x/week  PT DURATION: 8 weeks  PLANNED INTERVENTIONS: Therapeutic exercises, Therapeutic activity, Neuromuscular re-education, Balance training, Gait training, Patient/Family education, Self Care, Joint mobilization, Aquatic Therapy, Dry Needling, Electrical stimulation, Cryotherapy, Moist heat, Taping, and Manual therapy  PLAN FOR NEXT SESSION: in the pool and land work on ROM, scap stability shoulder strengthening; manual therapy to the neck. Add in general mobility when cleared for the hip  Stanton Kidney Tharon Aquas) Presly Steinruck MPT 03/16/22 12:32 PM

## 2022-03-19 ENCOUNTER — Encounter (HOSPITAL_BASED_OUTPATIENT_CLINIC_OR_DEPARTMENT_OTHER): Payer: Self-pay | Admitting: Physical Therapy

## 2022-03-19 ENCOUNTER — Ambulatory Visit (HOSPITAL_BASED_OUTPATIENT_CLINIC_OR_DEPARTMENT_OTHER): Payer: Medicare Other | Admitting: Physical Therapy

## 2022-03-19 DIAGNOSIS — M25552 Pain in left hip: Secondary | ICD-10-CM

## 2022-03-19 DIAGNOSIS — M545 Low back pain, unspecified: Secondary | ICD-10-CM

## 2022-03-19 DIAGNOSIS — G8929 Other chronic pain: Secondary | ICD-10-CM | POA: Diagnosis not present

## 2022-03-19 DIAGNOSIS — M25551 Pain in right hip: Secondary | ICD-10-CM | POA: Diagnosis not present

## 2022-03-19 DIAGNOSIS — M25511 Pain in right shoulder: Secondary | ICD-10-CM | POA: Diagnosis not present

## 2022-03-19 NOTE — Therapy (Signed)
OUTPATIENT PHYSICAL THERAPY SHOULDER TREATMENT NOTE   Patient Name: Virginia Gordon MRN: [id number] DOB:[birth year], 71 y.o., female Today's Date: 03/19/2022   PT End of Session - 03/19/22 1128     Visit Number 5    Number of Visits 16    Date for PT Re-Evaluation 04/27/22    PT Start Time 1031    PT Stop Time 1115    PT Time Calculation (min) 44 min    Activity Tolerance Patient tolerated treatment well    Behavior During Therapy [facility] for tasks assessed/performed              Past Medical History:  Diagnosis Date   Rheumatoid arthritis ([place])    Past Surgical History:  Procedure Laterality Date   BREAST BIOPSY Left    pt not sure of date.   HIP SURGERY     SHOULDER SURGERY     Patient Active Problem List   Diagnosis Date Noted   Effusion of left knee joint 08/17/2021   ADD (attention deficit disorder) 11/13/2015   Connective tissue disease overlap syndrome ([place]) 11/13/2015   Menopausal and perimenopausal disorder 11/13/2015   Acne erythematosa 11/13/2015   Disordered sleep 11/13/2015   History of operative procedure on hip 07/03/2015   Left knee pain 05/20/2014   Right foot pain 05/14/2014   Bilateral shoulder pain 05/14/2014   Hypothyroidism 03/08/2014   Degenerative arthritis of hip 03/07/2012    PCP: [name]   REFERRING PROVIDER: [name] MD   REFERRING DIAG: R29.898 (ICD-10-CM) - Other symptoms and signs involving the musculoskeletal system  THERAPY DIAG:  Chronic right shoulder pain  Chronic bilateral low back pain without sciatica  Pain in right hip  Pain in left hip  Rationale for Evaluation and Treatment Rehabilitation  ONSET DATE: Hip Revision 01/04/2022 Initial surgery was March 7th    SUBJECTIVE:                                                                                                                                                                                      SUBJECTIVE STATEMENT:  "Doing well, pain not bad  today"   PERTINENT HISTORY: Charcott foot, Left hip replacement and revision; right hip revision, Scoliosis, DDD, Lumbar compression fracture; Right Shoulder RTC tear( repaired 2x) Rheumatoid arthritis ( although tests were negative; R foot pain;   PAIN:  Are you having pain? Yes: NPRS scale: 4/10 at worst at rest no pain  Pain location: back, hip Pain description: aching  Aggravating factors: use of her shoulders and pressure  Relieving factors: rest    PRECAUTIONS: No work on the hip at this time  WEIGHT BEARING RESTRICTIONS Yes WBAT   FALLS:  Has patient fallen in last 6 months? Yes. Number of falls had 1 fall prior to her dislocation Since the surgery no falls  No LIVING ENVIRONMENT: No steps at her house  OCCUPATION: Retried   PLOF: Requires assistive device for independence  PATIENT GOALS   OBJECTIVE:   DIAGNOSTIC FINDINGS:  Has had frequent follow up x-rays. Per patient everything looks good   PATIENT SURVEYS:  FOTO    COGNITION:  Overall cognitive status: Within functional limits for tasks assessed     SENSATION: WFL  POSTURE: Raised shoulders bilateral   UPPER EXTREMITY ROM:   MMT ROM Right eval Left eval  Shoulder flexion 4/5 4+/5  Shoulder extension    Shoulder abduction    Shoulder adduction    Shoulder internal rotation 4+/5 4+/5  Shoulder external rotation 3+/5 3+/5   Elbow flexion    Elbow extension    Wrist flexion    Wrist extension    Wrist ulnar deviation    Wrist radial deviation    Wrist pronation    Wrist supination    (Blank rows = not tested)  UPPER EXTREMITY MMT:  AROM  Right eval Left eval  Shoulder flexion 94 140  Shoulder extension    Shoulder abduction    Shoulder adduction    Shoulder internal rotation Full  Full   Shoulder external rotation Can reach behind her head but needs to put her head forward  Can reach behind her head but needs to put her head forward  Middle trapezius    Lower trapezius     Elbow flexion    Elbow extension    Wrist flexion    Wrist extension    Wrist ulnar deviation    Wrist radial deviation    Wrist pronation    Wrist supination    Grip strength (lbs)    (Blank rows = not tested)  SHOULDER SPECIAL TESTS:    PALPATION:  Tenderness to palpation in the upper back    TODAY'S TREATMENT:  8/18 Pt seen for aquatic therapy today.  Treatment took place in water 3.25-4.5 ft in depth at the [facility]. Temp of water was 91.  Pt entered/exited the pool via stairs independently with bilat rail.     walking forward, back and side stepping as warm up  Straddling yellow noodle: gentle cycling with breast stroke arms; add/abd; skiing Tricep curls 2x5 on step Seated flutter; add/abd 3 x 20.  Cues for gentle movement Pelvic ROM sitting on squoodle: hip hiking; ant/post tilts Kick board row 2x15 Le staggered. Cues for abdominal bracing; glut tightening for core engagement Ue: 1 foam hand buoy: horizontal add/abd; shoulder flex/ext; add/abd Forward and backward amb between exercises for recovery  Pt requires the buoyancy and hydrostatic pressure of water for support, and to offload joints by unweighting joint load by at least 50 % in navel deep water and by at least 75-80% in chest to neck deep water.  Viscosity of the water is needed for resistance of strengthening. Water current perturbations provides challenge to standing balance requiring increased core activation.     PATIENT EDUCATION: Education details: reviewed HEP; symptom management; importance of following hip precautions and recommendations  Person educated: Patient Education method: Explanation Education comprehension: verbalized understanding, returned demonstration, verbal cues required, tactile cues required, and needs further education   HOME EXERCISE PROGRAM: TBA  ASSESSMENT:  CLINICAL IMPRESSION: Cues needed for LLE hip control/focus with amb submerged.  Pt with some  left sided pelvic discomfort with staggered foot position and kick board row. Focus on core and UE strengthening with activity.  She reports feeling muscle tightness throughout pelvis and LB. Resolved some with pelvic mobilizations.She tolerates session well without increase of discomfort.    OBJECTIVE IMPAIRMENTS Abnormal gait, decreased activity tolerance, decreased balance, decreased mobility, difficulty walking, decreased ROM, decreased strength, increased fascial restrictions, impaired UE functional use, and pain.   ACTIVITY LIMITATIONS carrying, lifting, bending, standing, squatting, stairs, transfers, bed mobility, reach over head, and locomotion level  PARTICIPATION LIMITATIONS: meal prep, cleaning, driving, shopping, community activity, occupation, and yard work  PERSONAL FACTORS 3+ comorbidities:  hip dislocation RTC tears; scoliosis; DDD of multiple joints; comression fx of the spine   are also affecting patient's functional outcome.   REHAB POTENTIAL: Good  CLINICAL DECISION MAKING: Unstable/unpredictable hip has dislocated since surgery; flutuating pain levels   EVALUATION COMPLEXITY: High   GOALS: Goals reviewed with patient? Yes  SHORT TERM GOALS: Target date: 03/31/2022  Patient will increase right active shoulder flexion to 100 degree s Baseline: Goal status: INITIAL  2.  Patient will increase gross right shoulder strength by 5 degrees  Baseline:  Goal status: INITIAL  3.  Patient will report a 50% reduction of pain in her shoulders while using her walker  Baseline:  Goal status: INITIAL    LONG TERM GOALS: Target date: 04/28/2022    Patent will be independent with an UE shoulder strength and stretching program  Baseline:  Goal status: INITIAL  2.  Patient will use both arms for mobility and ADL's with a 75% self reported reduction in pain Baseline:  Goal status: INITIAL  3.  Therapy will develop LTG's based on when she is cleared by ortho to begin hip  therapy    PLAN: PT FREQUENCY: 2x/week  PT DURATION: 8 weeks  PLANNED INTERVENTIONS: Therapeutic exercises, Therapeutic activity, Neuromuscular re-education, Balance training, Gait training, Patient/Family education, Self Care, Joint mobilization, Aquatic Therapy, Dry Needling, Electrical stimulation, Cryotherapy, Moist heat, Taping, and Manual therapy  PLAN FOR NEXT SESSION: in the pool and land work on ROM, scap stability shoulder strengthening; manual therapy to the neck. Add in general mobility when cleared for the hip  Stanton Kidney Tharon Aquas) Zarin Knupp MPT 03/19/22 11:29 AM

## 2022-03-22 ENCOUNTER — Encounter (HOSPITAL_BASED_OUTPATIENT_CLINIC_OR_DEPARTMENT_OTHER): Payer: Self-pay | Admitting: Physical Therapy

## 2022-03-22 ENCOUNTER — Ambulatory Visit (HOSPITAL_BASED_OUTPATIENT_CLINIC_OR_DEPARTMENT_OTHER): Payer: Medicare Other | Admitting: Physical Therapy

## 2022-03-22 DIAGNOSIS — M25551 Pain in right hip: Secondary | ICD-10-CM

## 2022-03-22 DIAGNOSIS — M25511 Pain in right shoulder: Secondary | ICD-10-CM | POA: Diagnosis not present

## 2022-03-22 DIAGNOSIS — G8929 Other chronic pain: Secondary | ICD-10-CM | POA: Diagnosis not present

## 2022-03-22 DIAGNOSIS — M545 Low back pain, unspecified: Secondary | ICD-10-CM | POA: Diagnosis not present

## 2022-03-22 DIAGNOSIS — M25552 Pain in left hip: Secondary | ICD-10-CM | POA: Diagnosis not present

## 2022-03-22 NOTE — Therapy (Signed)
OUTPATIENT PHYSICAL THERAPY SHOULDER TREATMENT NOTE   Patient Name: Virginia Gordon MRN: [id number] DOB:09-24-1950, 71 y.o., female Today's Date: 03/22/2022   PT End of Session - 03/22/22 1349     Visit Number 6    Number of Visits 16    Date for PT Re-Evaluation 04/27/22    PT Start Time 1201    PT Stop Time 1245    PT Time Calculation (min) 44 min    Activity Tolerance Patient tolerated treatment well    Behavior During Therapy [facility] for tasks assessed/performed               Past Medical History:  Diagnosis Date   Rheumatoid arthritis ([place])    Past Surgical History:  Procedure Laterality Date   BREAST BIOPSY Left    pt not sure of date.   HIP SURGERY     SHOULDER SURGERY     Patient Active Problem List   Diagnosis Date Noted   Effusion of left knee joint 08/17/2021   ADD (attention deficit disorder) 11/13/2015   Connective tissue disease overlap syndrome ([place]) 11/13/2015   Menopausal and perimenopausal disorder 11/13/2015   Acne erythematosa 11/13/2015   Disordered sleep 11/13/2015   History of operative procedure on hip 07/03/2015   Left knee pain 05/20/2014   Right foot pain 05/14/2014   Bilateral shoulder pain 05/14/2014   Hypothyroidism 03/08/2014   Degenerative arthritis of hip 03/07/2012    PCP: [name]   REFERRING PROVIDER: [name] MD   REFERRING DIAG: R29.898 (ICD-10-CM) - Other symptoms and signs involving the musculoskeletal system  THERAPY DIAG:  Chronic right shoulder pain  Chronic bilateral low back pain without sciatica  Pain in right hip  Rationale for Evaluation and Treatment Rehabilitation  ONSET DATE: Hip Revision 01/04/2022 Initial surgery was March 7th    SUBJECTIVE:                                                                                                                                                                                      SUBJECTIVE STATEMENT:  "Had a good weekend, feel  ok"   PERTINENT HISTORY: Charcott foot, Left hip replacement and revision; right hip revision, Scoliosis, DDD, Lumbar compression fracture; Right Shoulder RTC tear( repaired 2x) Rheumatoid arthritis ( although tests were negative; R foot pain;   PAIN:  Are you having pain? Yes: NPRS scale: 4/10 at worst at rest no pain  Pain location: back, hip Pain description: aching  Aggravating factors: use of her shoulders and pressure  Relieving factors: rest    PRECAUTIONS: No work on the hip at this time   [place]  Yes WBAT   FALLS:  Has patient fallen in last 6 months? Yes. Number of falls had 1 fall prior to her dislocation Since the surgery no falls  No LIVING ENVIRONMENT: No steps at her house  OCCUPATION: Retried   PLOF: Requires assistive device for independence  PATIENT GOALS   OBJECTIVE:   DIAGNOSTIC FINDINGS:  Has had frequent follow up x-rays. Per patient everything looks good   PATIENT SURVEYS:  FOTO    COGNITION:  Overall cognitive status: Within functional limits for tasks assessed     SENSATION: WFL  POSTURE: Raised shoulders bilateral   UPPER EXTREMITY ROM:   MMT ROM Right eval Left eval  Shoulder flexion 4/5 4+/5  Shoulder extension    Shoulder abduction    Shoulder adduction    Shoulder internal rotation 4+/5 4+/5  Shoulder external rotation 3+/5 3+/5   Elbow flexion    Elbow extension    Wrist flexion    Wrist extension    Wrist ulnar deviation    Wrist radial deviation    Wrist pronation    Wrist supination    (Blank rows = not tested)  UPPER EXTREMITY MMT:  AROM  Right eval Left eval  Shoulder flexion 94 140  Shoulder extension    Shoulder abduction    Shoulder adduction    Shoulder internal rotation Full  Full   Shoulder external rotation Can reach behind her head but needs to put her head forward  Can reach behind her head but needs to put her head forward  Middle trapezius    Lower trapezius    Elbow  flexion    Elbow extension    Wrist flexion    Wrist extension    Wrist ulnar deviation    Wrist radial deviation    Wrist pronation    Wrist supination    Grip strength (lbs)    (Blank rows = not tested)  SHOULDER SPECIAL TESTS:    PALPATION:  Tenderness to palpation in the upper back    TODAY'S TREATMENT:  8/18 Pt seen for aquatic therapy today.  Treatment took place in water 3.25-4.5 ft in depth at the [place]. Temp of water was 91.  Pt entered/exited the pool via stairs independently with bilat rail.   Straddling yellow noodle: gentle cycling with breast stroke arms forward and backward Tricep curls x10 seated on 4th step, x 10 on 3rd step Pelvic ROM sitting on squoodle: hip hiking; ant/post tilts Kick board row 2x15 Le staggered. Gentle resisted core rotation shortened range x6 R/L. Cues for abdominal bracing; glut tightening for core engagement Ue: 1 foam hand buoy rue, red hand paddle right: horizontal add/abd; shoulder flex/ext; add/abd Shoulder circles CW and CCW 2x15 Forward and backward amb between exercises for recovery  Pt requires the buoyancy and hydrostatic pressure of water for support, and to offload joints by unweighting joint load by at least 50 % in navel deep water and by at least 75-80% in chest to neck deep water.  Viscosity of the water is needed for resistance of strengthening. Water current perturbations provides challenge to standing balance requiring increased core activation.     PATIENT EDUCATION: Education details: reviewed HEP; symptom management; importance of following hip precautions and recommendations  Person educated: Patient Education method: Explanation Education comprehension: verbalized understanding, returned demonstration, verbal cues required, tactile cues required, and needs further education   HOME EXERCISE PROGRAM: TBA  ASSESSMENT:  CLINICAL IMPRESSION: Pt tolerates session without increased  discomfort.  She is encouraged to slow pace and intensity with exercises particularly involving movement of LE's. She tolerates just gentle resisted core rotating; cues to move in comfortable  range. Focused on core and ue strength and ROM.  Goals ongoing.    OBJECTIVE IMPAIRMENTS Abnormal gait, decreased activity tolerance, decreased balance, decreased mobility, difficulty walking, decreased ROM, decreased strength, increased fascial restrictions, impaired UE functional use, and pain.   ACTIVITY LIMITATIONS carrying, lifting, bending, standing, squatting, stairs, transfers, bed mobility, reach over head, and locomotion level  PARTICIPATION LIMITATIONS: meal prep, cleaning, driving, shopping, community activity, occupation, and yard work  PERSONAL FACTORS 3+ comorbidities:  hip dislocation RTC tears; scoliosis; DDD of multiple joints; comression fx of the spine   are also affecting patient's functional outcome.   REHAB POTENTIAL: Good  CLINICAL DECISION MAKING: Unstable/unpredictable hip has dislocated since surgery; flutuating pain levels   EVALUATION COMPLEXITY: High   GOALS: Goals reviewed with patient? Yes  SHORT TERM GOALS: Target date: 03/31/2022  Patient will increase right active shoulder flexion to 100 degree s Baseline: Goal status: INITIAL  2.  Patient will increase gross right shoulder strength by 5 degrees  Baseline:  Goal status: INITIAL  3.  Patient will report a 50% reduction of pain in her shoulders while using her walker  Baseline:  Goal status: INITIAL    LONG TERM GOALS: Target date: 04/28/2022    Patent will be independent with an UE shoulder strength and stretching program  Baseline:  Goal status: INITIAL  2.  Patient will use both arms for mobility and ADL's with a 75% self reported reduction in pain Baseline:  Goal status: INITIAL  3.  Therapy will develop LTG's based on when she is cleared by ortho to begin hip therapy    PLAN: PT FREQUENCY:  2x/week  PT DURATION: 8 weeks  PLANNED INTERVENTIONS: Therapeutic exercises, Therapeutic activity, Neuromuscular re-education, Balance training, Gait training, Patient/Family education, Self Care, Joint mobilization, Aquatic Therapy, Dry Needling, Electrical stimulation, Cryotherapy, Moist heat, Taping, and Manual therapy  PLAN FOR NEXT SESSION: in the pool and land work on ROM, scap stability shoulder strengthening; manual therapy to the neck. Add in general mobility when cleared for the hip  Stanton Kidney Tharon Aquas) Pansy Ostrovsky MPT 03/22/22 1:50 PM

## 2022-03-24 ENCOUNTER — Encounter (HOSPITAL_BASED_OUTPATIENT_CLINIC_OR_DEPARTMENT_OTHER): Payer: Self-pay | Admitting: Physical Therapy

## 2022-03-24 ENCOUNTER — Ambulatory Visit (HOSPITAL_BASED_OUTPATIENT_CLINIC_OR_DEPARTMENT_OTHER): Payer: Medicare Other | Admitting: Physical Therapy

## 2022-03-24 DIAGNOSIS — M25551 Pain in right hip: Secondary | ICD-10-CM

## 2022-03-24 DIAGNOSIS — M25511 Pain in right shoulder: Secondary | ICD-10-CM | POA: Diagnosis not present

## 2022-03-24 DIAGNOSIS — M545 Low back pain, unspecified: Secondary | ICD-10-CM | POA: Diagnosis not present

## 2022-03-24 DIAGNOSIS — G8929 Other chronic pain: Secondary | ICD-10-CM | POA: Diagnosis not present

## 2022-03-24 DIAGNOSIS — M25552 Pain in left hip: Secondary | ICD-10-CM | POA: Diagnosis not present

## 2022-03-24 NOTE — Therapy (Signed)
OUTPATIENT PHYSICAL THERAPY SHOULDER TREATMENT NOTE   Patient Name: Virginia Gordon MRN: [id number] DOB:November 04, 1950, 71 y.o., female Today's Date: 03/24/2022   PT End of Session - 03/24/22 1124     Visit Number 7    Number of Visits 16    Date for PT Re-Evaluation 04/27/22    PT Start Time 1120    PT Stop Time 1200    PT Time Calculation (min) 40 min    Activity Tolerance Patient tolerated treatment well    Behavior During Therapy [facility] for tasks assessed/performed               Past Medical History:  Diagnosis Date   Rheumatoid arthritis ([place])    Past Surgical History:  Procedure Laterality Date   BREAST BIOPSY Left    pt not sure of date.   HIP SURGERY     SHOULDER SURGERY     Patient Active Problem List   Diagnosis Date Noted   Effusion of left knee joint 08/17/2021   ADD (attention deficit disorder) 11/13/2015   Connective tissue disease overlap syndrome ([place]) 11/13/2015   Menopausal and perimenopausal disorder 11/13/2015   Acne erythematosa 11/13/2015   Disordered sleep 11/13/2015   History of operative procedure on hip 07/03/2015   Left knee pain 05/20/2014   Right foot pain 05/14/2014   Bilateral shoulder pain 05/14/2014   Hypothyroidism 03/08/2014   Degenerative arthritis of hip 03/07/2012    PCP: [name]   REFERRING PROVIDER: [name] MD   REFERRING DIAG: R29.898 (ICD-10-CM) - Other symptoms and signs involving the musculoskeletal system  THERAPY DIAG:  Chronic right shoulder pain  Chronic bilateral low back pain without sciatica  Pain in right hip  Rationale for Evaluation and Treatment Rehabilitation  ONSET DATE: Hip Revision 01/04/2022 Initial surgery was March 7th    SUBJECTIVE:                                                                                                                                                                                      SUBJECTIVE STATEMENT:  "Really tired after last  session"   PERTINENT HISTORY: Charcott foot, Left hip replacement and revision; right hip revision, Scoliosis, DDD, Lumbar compression fracture; Right Shoulder RTC tear( repaired 2x) Rheumatoid arthritis ( although tests were negative; R foot pain;   PAIN:  Are you having pain? Yes: NPRS scale: 0/10 at worst at rest no pain  Pain location: back, hip Pain description: aching  Aggravating factors: use of her shoulders and pressure  Relieving factors: rest Shoulder current 4/10   PRECAUTIONS: No work on the hip at this time   WEIGHT BEARING  RESTRICTIONS Yes WBAT   FALLS:  Has patient fallen in last 6 months? Yes. Number of falls had 1 fall prior to her dislocation Since the surgery no falls  No LIVING ENVIRONMENT: No steps at her house  OCCUPATION: Retried   PLOF: Requires assistive device for independence  PATIENT GOALS   OBJECTIVE:   DIAGNOSTIC FINDINGS:  Has had frequent follow up x-rays. Per patient everything looks good   PATIENT SURVEYS:  FOTO    COGNITION:  Overall cognitive status: Within functional limits for tasks assessed     SENSATION: WFL  POSTURE: Raised shoulders bilateral   UPPER EXTREMITY ROM:   MMT ROM Right eval Left eval  Shoulder flexion 4/5 4+/5  Shoulder extension    Shoulder abduction    Shoulder adduction    Shoulder internal rotation 4+/5 4+/5  Shoulder external rotation 3+/5 3+/5   Elbow flexion    Elbow extension    Wrist flexion    Wrist extension    Wrist ulnar deviation    Wrist radial deviation    Wrist pronation    Wrist supination    (Blank rows = not tested)  UPPER EXTREMITY MMT:  AROM  Right eval Left eval  Shoulder flexion 94 140  Shoulder extension    Shoulder abduction    Shoulder adduction    Shoulder internal rotation Full  Full   Shoulder external rotation Can reach behind her head but needs to put her head forward  Can reach behind her head but needs to put her head forward  Middle trapezius     Lower trapezius    Elbow flexion    Elbow extension    Wrist flexion    Wrist extension    Wrist ulnar deviation    Wrist radial deviation    Wrist pronation    Wrist supination    Grip strength (lbs)    (Blank rows = not tested)  SHOULDER SPECIAL TESTS:    PALPATION:  Tenderness to palpation in the upper back    TODAY'S TREATMENT:  8/18 Pt seen for aquatic therapy today.  Treatment took place in water 3.25-4.5 ft in depth at the [place]. Temp of water was 91.  Pt entered/exited the pool via stairs independently with bilat rail.   Straddling yellow noodle: gentle cycling with breast stroke arms forward and backward Tricep curls 2x10 seated on 4th step, 2x10 on 3rd step Pelvic ROM sitting on squoodle: hip hiking; ant/post tilts Wall push ups 2 x10 Plank on yellow noodle 3x 20s hold UE pull down in plank x5.  VC and demonstration for tech Ue: red hand paddle: horizontal add/abd; shoulder flex through full ext; add/abd Forward and backward amb between exercises for recovery  Pt requires the buoyancy and hydrostatic pressure of water for support, and to offload joints by unweighting joint load by at least 50 % in navel deep water and by at least 75-80% in chest to neck deep water.  Viscosity of the water is needed for resistance of strengthening. Water current perturbations provides challenge to standing balance requiring increased core activation.     PATIENT EDUCATION: Education details: reviewed HEP; symptom management; importance of following hip precautions and recommendations  Person educated: Patient Education method: Explanation Education comprehension: verbalized understanding, returned demonstration, verbal cues required, tactile cues required, and needs further education   HOME EXERCISE PROGRAM: TBA  ASSESSMENT:  CLINICAL IMPRESSION: Progressed core strengthening adding planks on yellow noodle. Pt able to gain position and hold with  good  execution increasing engagement of core. Initially some discomfort LB resolves with repetition. No increase pain.  Pt completes session with excellent toleration. Progressing well towards goal.      OBJECTIVE IMPAIRMENTS Abnormal gait, decreased activity tolerance, decreased balance, decreased mobility, difficulty walking, decreased ROM, decreased strength, increased fascial restrictions, impaired UE functional use, and pain.   ACTIVITY LIMITATIONS carrying, lifting, bending, standing, squatting, stairs, transfers, bed mobility, reach over head, and locomotion level  PARTICIPATION LIMITATIONS: meal prep, cleaning, driving, shopping, community activity, occupation, and yard work  PERSONAL FACTORS 3+ comorbidities:  hip dislocation RTC tears; scoliosis; DDD of multiple joints; comression fx of the spine   are also affecting patient's functional outcome.   REHAB POTENTIAL: Good  CLINICAL DECISION MAKING: Unstable/unpredictable hip has dislocated since surgery; flutuating pain levels   EVALUATION COMPLEXITY: High   GOALS: Goals reviewed with patient? Yes  SHORT TERM GOALS: Target date: 03/31/2022  Patient will increase right active shoulder flexion to 100 degree s Baseline: Goal status: INITIAL  2.  Patient will increase gross right shoulder strength by 5 degrees  Baseline:  Goal status: INITIAL  3.  Patient will report a 50% reduction of pain in her shoulders while using her walker  Baseline:  Goal status: INITIAL    LONG TERM GOALS: Target date: 04/28/2022    Patent will be independent with an UE shoulder strength and stretching program  Baseline:  Goal status: INITIAL  2.  Patient will use both arms for mobility and ADL's with a 75% self reported reduction in pain Baseline:  Goal status: INITIAL  3.  Therapy will develop LTG's based on when she is cleared by ortho to begin hip therapy    PLAN: PT FREQUENCY: 2x/week  PT DURATION: 8 weeks  PLANNED INTERVENTIONS:  Therapeutic exercises, Therapeutic activity, Neuromuscular re-education, Balance training, Gait training, Patient/Family education, Self Care, Joint mobilization, Aquatic Therapy, Dry Needling, Electrical stimulation, Cryotherapy, Moist heat, Taping, and Manual therapy  PLAN FOR NEXT SESSION: in the pool and land work on ROM, scap stability shoulder strengthening; manual therapy to the neck. Add in general mobility when cleared for the hip  Stanton Kidney Tharon Aquas) Mykela Mewborn MPT 03/24/22 12:03 PM

## 2022-03-29 ENCOUNTER — Ambulatory Visit (HOSPITAL_BASED_OUTPATIENT_CLINIC_OR_DEPARTMENT_OTHER): Payer: Medicare Other | Admitting: Physical Therapy

## 2022-03-30 ENCOUNTER — Ambulatory Visit (HOSPITAL_BASED_OUTPATIENT_CLINIC_OR_DEPARTMENT_OTHER): Payer: Medicare Other | Admitting: Physical Therapy

## 2022-03-30 DIAGNOSIS — M25511 Pain in right shoulder: Secondary | ICD-10-CM | POA: Diagnosis not present

## 2022-03-30 DIAGNOSIS — M25551 Pain in right hip: Secondary | ICD-10-CM | POA: Diagnosis not present

## 2022-03-30 DIAGNOSIS — R2689 Other abnormalities of gait and mobility: Secondary | ICD-10-CM

## 2022-03-30 DIAGNOSIS — G8929 Other chronic pain: Secondary | ICD-10-CM | POA: Diagnosis not present

## 2022-03-30 DIAGNOSIS — M545 Low back pain, unspecified: Secondary | ICD-10-CM | POA: Diagnosis not present

## 2022-03-30 DIAGNOSIS — M25552 Pain in left hip: Secondary | ICD-10-CM

## 2022-03-30 NOTE — Therapy (Signed)
OUTPATIENT PHYSICAL THERAPY SHOULDER TREATMENT NOTE   Patient Name: Virginia Gordon MRN: [id number] DOB:08-29-1950, 71 y.o., female Today's Date: 03/30/2022   PT End of Session - 03/30/22 1307     Visit Number 8    Number of Visits 16    Date for PT Re-Evaluation 04/27/22    PT Start Time 4098    PT Stop Time 1345    PT Time Calculation (min) 42 min    Activity Tolerance Patient tolerated treatment well    Behavior During Therapy [facility] for tasks assessed/performed               Past Medical History:  Diagnosis Date   Rheumatoid arthritis ([place])    Past Surgical History:  Procedure Laterality Date   BREAST BIOPSY Left    pt not sure of date.   HIP SURGERY     SHOULDER SURGERY     Patient Active Problem List   Diagnosis Date Noted   Effusion of left knee joint 08/17/2021   ADD (attention deficit disorder) 11/13/2015   Connective tissue disease overlap syndrome ([place]) 11/13/2015   Menopausal and perimenopausal disorder 11/13/2015   Acne erythematosa 11/13/2015   Disordered sleep 11/13/2015   History of operative procedure on hip 07/03/2015   Left knee pain 05/20/2014   Right foot pain 05/14/2014   Bilateral shoulder pain 05/14/2014   Hypothyroidism 03/08/2014   Degenerative arthritis of hip 03/07/2012    PCP: [name]   REFERRING PROVIDER: [name] MD   REFERRING DIAG: R29.898 (ICD-10-CM) - Other symptoms and signs involving the musculoskeletal system  THERAPY DIAG:  No diagnosis found.  Rationale for Evaluation and Treatment Rehabilitation  ONSET DATE: Hip Revision 01/04/2022 Initial surgery was March 7th    SUBJECTIVE:                                                                                                                                                                                      SUBJECTIVE STATEMENT:  The patient reports her shoulders are sore, but no more then usual. Her hip is sore when she stands and walks.     PERTINENT HISTORY: Charcott foot, Left hip replacement and revision; right hip revision, Scoliosis, DDD, Lumbar compression fracture; Right Shoulder RTC tear( repaired 2x) Rheumatoid arthritis ( although tests were negative; R foot pain;   PAIN:  Are you having pain? Yes: NPRS scale: 2/10 at worst  Pain location: shoulders  Pain description: aching  Aggravating factors: use of her shoulders and pressure  Relieving factors: rest Shoulder current 4/10   PRECAUTIONS: No work on the hip at this time   [place]  Yes WBAT   FALLS:  Has patient fallen in last 6 months? Yes. Number of falls had 1 fall prior to her dislocation Since the surgery no falls  No LIVING ENVIRONMENT: No steps at her house  OCCUPATION: Retried   PLOF: Requires assistive device for independence  PATIENT GOALS   OBJECTIVE:   DIAGNOSTIC FINDINGS:  Has had frequent follow up x-rays. Per patient everything looks good   PATIENT SURVEYS:  FOTO    COGNITION:  Overall cognitive status: Within functional limits for tasks assessed     SENSATION: WFL  POSTURE: Raised shoulders bilateral   UPPER EXTREMITY ROM:   MMT ROM Right eval Left eval  Shoulder flexion 4/5 4+/5  Shoulder extension    Shoulder abduction    Shoulder adduction    Shoulder internal rotation 4+/5 4+/5  Shoulder external rotation 3+/5 3+/5   Elbow flexion    Elbow extension    Wrist flexion    Wrist extension    Wrist ulnar deviation    Wrist radial deviation    Wrist pronation    Wrist supination    (Blank rows = not tested)  UPPER EXTREMITY MMT:  AROM  Right eval Left eval  Shoulder flexion 94 140  Shoulder extension    Shoulder abduction    Shoulder adduction    Shoulder internal rotation Full  Full   Shoulder external rotation Can reach behind her head but needs to put her head forward  Can reach behind her head but needs to put her head forward  Middle trapezius    Lower trapezius     Elbow flexion    Elbow extension    Wrist flexion    Wrist extension    Wrist ulnar deviation    Wrist radial deviation    Wrist pronation    Wrist supination    Grip strength (lbs)    (Blank rows = not tested)  SHOULDER SPECIAL TESTS:    PALPATION:  Tenderness to palpation in the upper back    TODAY'S TREATMENT:  8/29 Pt seen for aquatic therapy today.  Treatment took place in water 3.25-4.5 ft in depth at the [place]. Temp of water was 91.  Pt entered/exited the pool via stairs independently with bilat rail.   Straddling yellow noodle: gentle cycling with breast stroke arms forward and backward Tricep curls 2x10 seated on 4th step, 2x10 on 3rd step Chest deep shoulder flexion x20 scpation x20  Wall push ups 2 x10 Kick borard reach and stretch x10 5 sec hold; kick boards rotation 10 each side  UE pull down in plank x5.  VC and demonstration for tech Ue: red hand paddle: ER IR x20 circles cw and ccw x20 each flexion extension x20  Forward and backward amb between exercises for recovery Rainbow weight extension x20 quick alt extension x20  row x20  Pt requires the buoyancy and hydrostatic pressure of water for support, and to offload joints by unweighting joint load by at least 50 % in navel deep water and by at least 75-80% in chest to neck deep water.  Viscosity of the water is needed for resistance of strengthening. Water current perturbations provides challenge to standing balance requiring increased core activ     PATIENT EDUCATION: Education details: reviewed HEP; symptom management; importance of following hip precautions and recommendations  Person educated: Patient Education method: Explanation Education comprehension: verbalized understanding, returned demonstration, verbal cues required, tactile cues required, and needs further education   HOME EXERCISE  PROGRAM: TBA  ASSESSMENT:  CLINICAL IMPRESSION: The patient continues to tolerate  pool exercises well. She had no increase in pain with her shoulder exercises. She is able to do most exercises's in standing which is likely offering some challenge to her leg endurance muscles. She was advised to use her core muscles when she does her exercises.    OBJECTIVE IMPAIRMENTS Abnormal gait, decreased activity tolerance, decreased balance, decreased mobility, difficulty walking, decreased ROM, decreased strength, increased fascial restrictions, impaired UE functional use, and pain.   ACTIVITY LIMITATIONS carrying, lifting, bending, standing, squatting, stairs, transfers, bed mobility, reach over head, and locomotion level  PARTICIPATION LIMITATIONS: meal prep, cleaning, driving, shopping, community activity, occupation, and yard work  PERSONAL FACTORS 3+ comorbidities:  hip dislocation RTC tears; scoliosis; DDD of multiple joints; comression fx of the spine   are also affecting patient's functional outcome.   REHAB POTENTIAL: Good  CLINICAL DECISION MAKING: Unstable/unpredictable hip has dislocated since surgery; flutuating pain levels   EVALUATION COMPLEXITY: High   GOALS: Goals reviewed with patient? Yes  SHORT TERM GOALS: Target date: 03/31/2022  Patient will increase right active shoulder flexion to 100 degree s Baseline: Goal status: INITIAL  2.  Patient will increase gross right shoulder strength by 5 degrees  Baseline:  Goal status: INITIAL  3.  Patient will report a 50% reduction of pain in her shoulders while using her walker  Baseline:  Goal status: INITIAL    LONG TERM GOALS: Target date: 04/28/2022    Patent will be independent with an UE shoulder strength and stretching program  Baseline:  Goal status: INITIAL  2.  Patient will use both arms for mobility and ADL's with a 75% self reported reduction in pain Baseline:  Goal status: INITIAL  3.  Therapy will develop LTG's based on when she is cleared by ortho to begin hip therapy    PLAN: PT  FREQUENCY: 2x/week  PT DURATION: 8 weeks  PLANNED INTERVENTIONS: Therapeutic exercises, Therapeutic activity, Neuromuscular re-education, Balance training, Gait training, Patient/Family education, Self Care, Joint mobilization, Aquatic Therapy, Dry Needling, Electrical stimulation, Cryotherapy, Moist heat, Taping, and Manual therapy  PLAN FOR NEXT SESSION: in the pool and land work on ROM, scap stability shoulder strengthening; manual therapy to the neck. Add in general mobility when cleared for the hip  Carolyne Littles PT DPT  03/30/22 1:12 PM

## 2022-03-31 ENCOUNTER — Encounter (HOSPITAL_BASED_OUTPATIENT_CLINIC_OR_DEPARTMENT_OTHER): Payer: Self-pay | Admitting: Physical Therapy

## 2022-03-31 DIAGNOSIS — E039 Hypothyroidism, unspecified: Secondary | ICD-10-CM | POA: Diagnosis not present

## 2022-04-01 ENCOUNTER — Encounter (HOSPITAL_BASED_OUTPATIENT_CLINIC_OR_DEPARTMENT_OTHER): Payer: Self-pay | Admitting: Physical Therapy

## 2022-04-01 ENCOUNTER — Ambulatory Visit (HOSPITAL_BASED_OUTPATIENT_CLINIC_OR_DEPARTMENT_OTHER): Payer: Medicare Other | Admitting: Physical Therapy

## 2022-04-01 DIAGNOSIS — G8929 Other chronic pain: Secondary | ICD-10-CM

## 2022-04-01 DIAGNOSIS — M25511 Pain in right shoulder: Secondary | ICD-10-CM | POA: Diagnosis not present

## 2022-04-01 DIAGNOSIS — M25551 Pain in right hip: Secondary | ICD-10-CM | POA: Diagnosis not present

## 2022-04-01 DIAGNOSIS — M545 Low back pain, unspecified: Secondary | ICD-10-CM

## 2022-04-01 DIAGNOSIS — M25552 Pain in left hip: Secondary | ICD-10-CM | POA: Diagnosis not present

## 2022-04-01 NOTE — Therapy (Signed)
OUTPATIENT PHYSICAL THERAPY SHOULDER TREATMENT NOTE   Patient Name: Virginia Gordon MRN: [id number] DOB:1951-03-01, 71 y.o., female Today's Date: 04/01/2022   PT End of Session - 04/01/22 1253     Visit Number 9    Number of Visits 16    Date for PT Re-Evaluation 04/27/22    PT Start Time 1210    PT Stop Time 1249    PT Time Calculation (min) 39 min    Activity Tolerance Patient tolerated treatment well    Behavior During Therapy [facility] for tasks assessed/performed                Past Medical History:  Diagnosis Date   Rheumatoid arthritis ([place])    Past Surgical History:  Procedure Laterality Date   BREAST BIOPSY Left    pt not sure of date.   HIP SURGERY     SHOULDER SURGERY     Patient Active Problem List   Diagnosis Date Noted   Effusion of left knee joint 08/17/2021   ADD (attention deficit disorder) 11/13/2015   Connective tissue disease overlap syndrome ([place]) 11/13/2015   Menopausal and perimenopausal disorder 11/13/2015   Acne erythematosa 11/13/2015   Disordered sleep 11/13/2015   History of operative procedure on hip 07/03/2015   Left knee pain 05/20/2014   Right foot pain 05/14/2014   Bilateral shoulder pain 05/14/2014   Hypothyroidism 03/08/2014   Degenerative arthritis of hip 03/07/2012    PCP: [name]   REFERRING PROVIDER: [name] MD   REFERRING DIAG: R29.898 (ICD-10-CM) - Other symptoms and signs involving the musculoskeletal system  THERAPY DIAG:  Chronic right shoulder pain  Chronic bilateral low back pain without sciatica  Pain in right hip  Rationale for Evaluation and Treatment Rehabilitation  ONSET DATE: Hip Revision 01/04/2022 Initial surgery was March 7th    SUBJECTIVE:                                                                                                                                                                                      SUBJECTIVE STATEMENT:  "Able to get some gardening done without  any problems".    PERTINENT HISTORY: Charcott foot, Left hip replacement and revision; right hip revision, Scoliosis, DDD, Lumbar compression fracture; Right Shoulder RTC tear( repaired 2x) Rheumatoid arthritis ( although tests were negative; R foot pain;   PAIN:  Are you having pain? Yes: NPRS scale: 2/10 at worst  Pain location: shoulders  Pain description: aching  Aggravating factors: use of her shoulders and pressure  Relieving factors: rest Shoulder current 4/10   PRECAUTIONS: No work on the hip at this time  WEIGHT BEARING RESTRICTIONS Yes WBAT   FALLS:  Has patient fallen in last 6 months? Yes. Number of falls had 1 fall prior to her dislocation Since the surgery no falls  No LIVING ENVIRONMENT: No steps at her house  OCCUPATION: Retried   PLOF: Requires assistive device for independence  PATIENT GOALS   OBJECTIVE:   DIAGNOSTIC FINDINGS:  Has had frequent follow up x-rays. Per patient everything looks good   PATIENT SURVEYS:  FOTO    COGNITION:  Overall cognitive status: Within functional limits for tasks assessed     SENSATION: WFL  POSTURE: Raised shoulders bilateral   UPPER EXTREMITY ROM:   MMT ROM Right eval Left eval  Shoulder flexion 4/5 4+/5  Shoulder extension    Shoulder abduction    Shoulder adduction    Shoulder internal rotation 4+/5 4+/5  Shoulder external rotation 3+/5 3+/5   Elbow flexion    Elbow extension    Wrist flexion    Wrist extension    Wrist ulnar deviation    Wrist radial deviation    Wrist pronation    Wrist supination    (Blank rows = not tested)  UPPER EXTREMITY MMT:  AROM  Right eval Left eval  Shoulder flexion 94 140  Shoulder extension    Shoulder abduction    Shoulder adduction    Shoulder internal rotation Full  Full   Shoulder external rotation Can reach behind her head but needs to put her head forward  Can reach behind her head but needs to put her head forward  Middle trapezius    Lower  trapezius    Elbow flexion    Elbow extension    Wrist flexion    Wrist extension    Wrist ulnar deviation    Wrist radial deviation    Wrist pronation    Wrist supination    Grip strength (lbs)    (Blank rows = not tested)  SHOULDER SPECIAL TESTS:    PALPATION:  Tenderness to palpation in the upper back    TODAY'S TREATMENT:  8/29 Pt seen for aquatic therapy today.  Treatment took place in water 3.25-4.5 ft in depth at the [place] [facility]. Temp of water was 91.  Pt entered/exited the pool via stairs independently with bilat rail.  Straddling yellow noodle: gentle cycling with breast stroke arms forward and backward Pelvic ROM standing holding to wall: hip hiking; ant/post tilts Wall push ups 2 x10 Ue: red hand paddle chest deep: horizontal add/abd; shoulder flex through full ext; add/abd [employer] row 2x15 with le staggered Plank on yellow noodle 3x 20s hold UE pull down in plank x5.  VC and demonstration for tech Forward and backward amb between exercises for recovery   Pt requires the buoyancy and hydrostatic pressure of water for support, and to offload joints by unweighting joint load by at least 50 % in navel deep water and by at least 75-80% in chest to neck deep water.  Viscosity of the water is needed for resistance of strengthening. Water current perturbations provides challenge to standing balance requiring increased core activ     PATIENT EDUCATION: Education details: reviewed HEP; symptom management; importance of following hip precautions and recommendations  Person educated: Patient Education method: Explanation Education comprehension: verbalized understanding, returned demonstration, verbal cues required, tactile cues required, and needs further education   HOME EXERCISE PROGRAM: TBA  ASSESSMENT:  CLINICAL IMPRESSION: Pt reports good response from last session. Able to get out into garden and work  without increase in pain or excessive  fatigue.  Worked core and ue strengthening today.  Improved core control with plank position. She is challenged with arm pull downs in plank although completed at end of session where pt is fatiguing.  She is able to maintain good posture throughout standing exercises.  Next visit on land for assessment.  She will continue to benefit from aquatic therapy using properties of water to facilitate progression towards on land goals.   OBJECTIVE IMPAIRMENTS Abnormal gait, decreased activity tolerance, decreased balance, decreased mobility, difficulty walking, decreased ROM, decreased strength, increased fascial restrictions, impaired UE functional use, and pain.   ACTIVITY LIMITATIONS carrying, lifting, bending, standing, squatting, stairs, transfers, bed mobility, reach over head, and locomotion level  PARTICIPATION LIMITATIONS: meal prep, cleaning, driving, shopping, community activity, occupation, and yard work  PERSONAL FACTORS 3+ comorbidities:  hip dislocation RTC tears; scoliosis; DDD of multiple joints; comression fx of the spine   are also affecting patient's functional outcome.   REHAB POTENTIAL: Good  CLINICAL DECISION MAKING: Unstable/unpredictable hip has dislocated since surgery; flutuating pain levels   EVALUATION COMPLEXITY: High   GOALS: Goals reviewed with patient? Yes  SHORT TERM GOALS: Target date: 03/31/2022  Patient will increase right active shoulder flexion to 100 degree s Baseline: Goal status: INITIAL  2.  Patient will increase gross right shoulder strength by 5 degrees  Baseline:  Goal status: INITIAL  3.  Patient will report a 50% reduction of pain in her shoulders while using her walker  Baseline:  Goal status: INITIAL    LONG TERM GOALS: Target date: 04/28/2022    Patent will be independent with an UE shoulder strength and stretching program  Baseline:  Goal status: INITIAL  2.  Patient will use both arms for mobility and ADL's with a 75% self reported  reduction in pain Baseline:  Goal status: INITIAL  3.  Therapy will develop LTG's based on when she is cleared by ortho to begin hip therapy    PLAN: PT FREQUENCY: 2x/week  PT DURATION: 8 weeks  PLANNED INTERVENTIONS: Therapeutic exercises, Therapeutic activity, Neuromuscular re-education, Balance training, Gait training, Patient/Family education, Self Care, Joint mobilization, Aquatic Therapy, Dry Needling, Electrical stimulation, Cryotherapy, Moist heat, Taping, and Manual therapy  PLAN FOR NEXT SESSION: in the pool and land work on ROM, scap stability shoulder strengthening; manual therapy to the neck. Add in general mobility when cleared for the hip  Stanton Kidney Tharon Aquas) Delina Kruczek MPT 04/01/22 12:54 PM

## 2022-04-06 ENCOUNTER — Encounter (HOSPITAL_BASED_OUTPATIENT_CLINIC_OR_DEPARTMENT_OTHER): Payer: Self-pay | Admitting: Physical Therapy

## 2022-04-06 ENCOUNTER — Ambulatory Visit (HOSPITAL_BASED_OUTPATIENT_CLINIC_OR_DEPARTMENT_OTHER): Payer: Medicare Other | Attending: Family Medicine | Admitting: Physical Therapy

## 2022-04-06 DIAGNOSIS — G8929 Other chronic pain: Secondary | ICD-10-CM | POA: Diagnosis not present

## 2022-04-06 DIAGNOSIS — M25511 Pain in right shoulder: Secondary | ICD-10-CM | POA: Diagnosis not present

## 2022-04-06 DIAGNOSIS — M25552 Pain in left hip: Secondary | ICD-10-CM | POA: Insufficient documentation

## 2022-04-06 DIAGNOSIS — M25551 Pain in right hip: Secondary | ICD-10-CM | POA: Insufficient documentation

## 2022-04-06 DIAGNOSIS — R2689 Other abnormalities of gait and mobility: Secondary | ICD-10-CM | POA: Insufficient documentation

## 2022-04-06 DIAGNOSIS — M545 Low back pain, unspecified: Secondary | ICD-10-CM | POA: Insufficient documentation

## 2022-04-06 NOTE — Therapy (Addendum)
OUTPATIENT PHYSICAL THERAPY SHOULDER TREATMENT NOTE/ Progress Note    Patient Name: Virginia Gordon MRN: [id number] DOB:Nov 15, 1950, 71 y.o., female Today's Date: 04/06/2022  Progress Note Reporting Period 03/02/2022 to 04/06/2022  See note below for Objective Data and Assessment of Progress/Goals.       PT End of Session - 04/06/22 1350     Visit Number 10    Number of Visits 16    Date for PT Re-Evaluation 04/27/22    Authorization Type Progress note done on the 10th visit    PT Start Time 1345    PT Stop Time 1428    PT Time Calculation (min) 43 min    Activity Tolerance Patient tolerated treatment well    Behavior During Therapy WFL for tasks assessed/performed                Past Medical History:  Diagnosis Date   Rheumatoid arthritis ([place])    Past Surgical History:  Procedure Laterality Date   BREAST BIOPSY Left    pt not sure of date.   HIP SURGERY     SHOULDER SURGERY     Patient Active Problem List   Diagnosis Date Noted   Effusion of left knee joint 08/17/2021   ADD (attention deficit disorder) 11/13/2015   Connective tissue disease overlap syndrome ([place]) 11/13/2015   Menopausal and perimenopausal disorder 11/13/2015   Acne erythematosa 11/13/2015   Disordered sleep 11/13/2015   History of operative procedure on hip 07/03/2015   Left knee pain 05/20/2014   Right foot pain 05/14/2014   Bilateral shoulder pain 05/14/2014   Hypothyroidism 03/08/2014   Degenerative arthritis of hip 03/07/2012    PCP: [name]   REFERRING PROVIDER: [name] MD   REFERRING DIAG: R29.898 (ICD-10-CM) - Other symptoms and signs involving the musculoskeletal system  THERAPY DIAG:  Chronic right shoulder pain  Chronic bilateral low back pain without sciatica  Pain in right hip  Pain in left hip  Other abnormalities of gait and mobility  Rationale for Evaluation and Treatment Rehabilitation  ONSET DATE: Hip Revision 01/04/2022 Initial surgery was  March 7th    SUBJECTIVE:                                                                                                                                                                                      SUBJECTIVE STATEMENT:  "Able to get some gardening done without any problems".    PERTINENT HISTORY: Charcott foot, Left hip replacement and revision; right hip revision, Scoliosis, DDD, Lumbar compression fracture; Right Shoulder RTC tear( repaired 2x) Rheumatoid arthritis ( although tests were negative; R foot pain;  PAIN:  Are you having pain? Yes: NPRS scale: 2/10 at worst  Pain location: shoulders  Pain description: aching  Aggravating factors: use of her shoulders and pressure  Relieving factors: rest Shoulder current 4/10   PRECAUTIONS: No work on the hip at this time   WEIGHT BEARING RESTRICTIONS Yes WBAT   FALLS:  Has patient fallen in last 6 months? Yes. Number of falls had 1 fall prior to her dislocation Since the surgery no falls  No LIVING ENVIRONMENT: No steps at her house  OCCUPATION: Retried   PLOF: Requires assistive device for independence  PATIENT GOALS   OBJECTIVE:   DIAGNOSTIC FINDINGS:  Has had frequent follow up x-rays. Per patient everything looks good   PATIENT SURVEYS:  FOTO    COGNITION:  Overall cognitive status: Within functional limits for tasks assessed     SENSATION: WFL  POSTURE: Raised shoulders bilateral   UPPER EXTREMITY ROM:   MMT ROM Right eval Left eval Right  Left   Shoulder flexion 4/5 4+/5 4+/5 11.1 4+/5 11.7  Shoulder extension      Shoulder abduction      Shoulder adduction      Shoulder internal rotation 4+/5 4+/5 9.9 13.1  Shoulder external rotation 3+/5 3+/5  9.8 10.3  Elbow flexion      Elbow extension      Wrist flexion      Wrist extension      Wrist ulnar deviation      Wrist radial deviation      Wrist pronation      Wrist supination      (Blank rows = not tested)  UPPER EXTREMITY  MMT:  AROM  Right eval Left eval Right  Left   Shoulder flexion 94 140 100 148  Shoulder extension      Shoulder abduction      Shoulder adduction      Shoulder internal rotation Full  Full     Shoulder external rotation Can reach behind her head but needs to put her head forward  Can reach behind her head but needs to put her head forward Improved  Improved   Middle trapezius      Lower trapezius      Elbow flexion      Elbow extension      Wrist flexion      Wrist extension      Wrist ulnar deviation      Wrist radial deviation      Wrist pronation      Wrist supination      Grip strength (lbs)      (Blank rows = not tested)  SHOULDER SPECIAL TESTS:    PALPATION:  Tenderness to palpation in the upper back    TODAY'S TREATMENT:  9/5  Bilateral er 2x15 yellow  Bilateral horizontal abduction 2x15 yellow  Bilateral flexion  2x15 yellow band   Curls 2x15 2lbs  Hammer cruls 2x15   Supine wand flexion 2x20  Supine ABC 1x each shoulder   Reviewed HEP; measurements; goals going forward and functional goals   8/29 Pt seen for aquatic therapy today.  Treatment took place in water 3.25-4.5 ft in depth at the [place]. Temp of water was 91.  Pt entered/exited the pool via stairs independently with bilat rail.  Straddling yellow noodle: gentle cycling with breast stroke arms forward and backward Pelvic ROM standing holding to wall: hip hiking; ant/post tilts Wall push ups 2 x10 Ue: red hand paddle  chest deep: horizontal add/abd; shoulder flex through full ext; add/abd Kick board row 2x15 with le staggered Plank on yellow noodle 3x 20s hold UE pull down in plank x5.  VC and demonstration for tech Forward and backward amb between exercises for recovery   Pt requires the buoyancy and hydrostatic pressure of water for support, and to offload joints by unweighting joint load by at least 50 % in navel deep water and by at least 75-80% in chest to neck deep  water.  Viscosity of the water is needed for resistance of strengthening. Water current perturbations provides challenge to standing balance requiring increased core activ     PATIENT EDUCATION: Education details: reviewed HEP; symptom management; importance of following hip precautions and recommendations  Person educated: Patient Education method: Explanation Education comprehension: verbalized understanding, returned demonstration, verbal cues required, tactile cues required, and needs further education   HOME EXERCISE PROGRAM:  [id number]   ASSESSMENT:  CLINICAL IMPRESSION: The patient has made some progress. Her strength has improved with flexion and ER on the right. She has had minor improvements in shoulder motion. She is still having pain when she is walking in her shoulders. She is reaching out a little better. She is also swimming back stroke. We reviewed a program for home and gave her an updated HEP. She would benefit from further skilled therapy 2W8     OBJECTIVE IMPAIRMENTS Abnormal gait, decreased activity tolerance, decreased balance, decreased mobility, difficulty walking, decreased ROM, decreased strength, increased fascial restrictions, impaired UE functional use, and pain.   ACTIVITY LIMITATIONS carrying, lifting, bending, standing, squatting, stairs, transfers, bed mobility, reach over head, and locomotion level  PARTICIPATION LIMITATIONS: meal prep, cleaning, driving, shopping, community activity, occupation, and yard work  PERSONAL FACTORS 3+ comorbidities:  hip dislocation RTC tears; scoliosis; DDD of multiple joints; comression fx of the spine   are also affecting patient's functional outcome.   REHAB POTENTIAL: Good  CLINICAL DECISION MAKING: Unstable/unpredictable hip has dislocated since surgery; flutuating pain levels   EVALUATION COMPLEXITY: High   GOALS: Goals reviewed with patient? Yes  SHORT TERM GOALS: Target date: 03/31/2022 9/5 Patient will  increase right active shoulder flexion to 100 degree s Baseline: improving flexion bilateral  Goal status: ongoing   2.  Patient will increase gross right shoulder strength by 5 lbs  Baseline:  Goal status: just measured with dyno today   3.  Patient will report a 50% reduction of pain in her shoulders while using her walker  Baseline:  Goal status: still pain ful ongoing     LONG TERM GOALS: Target date: 04/28/2022    Patent will be independent with an UE shoulder strength and stretching program  Baseline:  Goal status: progressing   2.  Patient will use both arms for mobility and ADL's with a 75% self reported reduction in pain Baseline:  Goal status: still having pain but improved   3.  Patient will lift her walker into and out of the car to improve her ability to perform community activity. Goal New    PLAN: PT FREQUENCY: 2x/week  PT DURATION: 8 weeks  PLANNED INTERVENTIONS: Therapeutic exercises, Therapeutic activity, Neuromuscular re-education, Balance training, Gait training, Patient/Family education, Self Care, Joint mobilization, Aquatic Therapy, Dry Needling, Electrical stimulation, Cryotherapy, Moist heat, Taping, and Manual therapy  PLAN FOR NEXT SESSION: in the pool and land work on ROM, scap stability shoulder strengthening; manual therapy to the neck. Add in general mobility when cleared for the hip  Carolyne Littles  PT DPT 04/06/22 3:14 PM

## 2022-04-08 ENCOUNTER — Ambulatory Visit (HOSPITAL_BASED_OUTPATIENT_CLINIC_OR_DEPARTMENT_OTHER): Payer: Medicare Other | Admitting: Physical Therapy

## 2022-04-08 ENCOUNTER — Encounter (HOSPITAL_BASED_OUTPATIENT_CLINIC_OR_DEPARTMENT_OTHER): Payer: Self-pay | Admitting: Physical Therapy

## 2022-04-08 DIAGNOSIS — M545 Low back pain, unspecified: Secondary | ICD-10-CM

## 2022-04-08 DIAGNOSIS — M25552 Pain in left hip: Secondary | ICD-10-CM | POA: Diagnosis not present

## 2022-04-08 DIAGNOSIS — M069 Rheumatoid arthritis, unspecified: Secondary | ICD-10-CM | POA: Diagnosis not present

## 2022-04-08 DIAGNOSIS — R1013 Epigastric pain: Secondary | ICD-10-CM | POA: Diagnosis not present

## 2022-04-08 DIAGNOSIS — M810 Age-related osteoporosis without current pathological fracture: Secondary | ICD-10-CM | POA: Diagnosis not present

## 2022-04-08 DIAGNOSIS — Z23 Encounter for immunization: Secondary | ICD-10-CM | POA: Diagnosis not present

## 2022-04-08 DIAGNOSIS — R2689 Other abnormalities of gait and mobility: Secondary | ICD-10-CM | POA: Diagnosis not present

## 2022-04-08 DIAGNOSIS — G8929 Other chronic pain: Secondary | ICD-10-CM | POA: Diagnosis not present

## 2022-04-08 DIAGNOSIS — M25551 Pain in right hip: Secondary | ICD-10-CM | POA: Diagnosis not present

## 2022-04-08 DIAGNOSIS — E039 Hypothyroidism, unspecified: Secondary | ICD-10-CM | POA: Diagnosis not present

## 2022-04-08 DIAGNOSIS — F3341 Major depressive disorder, recurrent, in partial remission: Secondary | ICD-10-CM | POA: Diagnosis not present

## 2022-04-08 DIAGNOSIS — M25559 Pain in unspecified hip: Secondary | ICD-10-CM | POA: Diagnosis not present

## 2022-04-08 DIAGNOSIS — R29898 Other symptoms and signs involving the musculoskeletal system: Secondary | ICD-10-CM | POA: Diagnosis not present

## 2022-04-08 DIAGNOSIS — I10 Essential (primary) hypertension: Secondary | ICD-10-CM | POA: Diagnosis not present

## 2022-04-08 DIAGNOSIS — G479 Sleep disorder, unspecified: Secondary | ICD-10-CM | POA: Diagnosis not present

## 2022-04-08 DIAGNOSIS — M25511 Pain in right shoulder: Secondary | ICD-10-CM | POA: Diagnosis not present

## 2022-04-08 DIAGNOSIS — M5441 Lumbago with sciatica, right side: Secondary | ICD-10-CM | POA: Diagnosis not present

## 2022-04-08 NOTE — Therapy (Signed)
OUTPATIENT PHYSICAL THERAPY SHOULDER TREATMENT NOTE   Patient Name: Virginia Gordon MRN: [id number] DOB:[birth year], 71 y.o., female Today's Date: 04/08/2022   PT End of Session - 04/08/22 1035     Visit Number 11    Number of Visits 26    Date for PT Re-Evaluation 06/01/22    Authorization Type Progress note done on the 20th visit    PT Start Time 1032    PT Stop Time 1110    PT Time Calculation (min) 38 min    Activity Tolerance Patient tolerated treatment well    Behavior During Therapy [facility] for tasks assessed/performed                Past Medical History:  Diagnosis Date   Rheumatoid arthritis ([place])    Past Surgical History:  Procedure Laterality Date   BREAST BIOPSY Left    pt not sure of date.   HIP SURGERY     SHOULDER SURGERY     Patient Active Problem List   Diagnosis Date Noted   Effusion of left knee joint 08/17/2021   ADD (attention deficit disorder) 11/13/2015   Connective tissue disease overlap syndrome ([place]) 11/13/2015   Menopausal and perimenopausal disorder 11/13/2015   Acne erythematosa 11/13/2015   Disordered sleep 11/13/2015   History of operative procedure on hip 07/03/2015   Left knee pain 05/20/2014   Right foot pain 05/14/2014   Bilateral shoulder pain 05/14/2014   Hypothyroidism 03/08/2014   Degenerative arthritis of hip 03/07/2012    PCP: [name]   REFERRING PROVIDER: [name] MD   REFERRING DIAG: R29.898 (ICD-10-CM) - Other symptoms and signs involving the musculoskeletal system  THERAPY DIAG:  Chronic right shoulder pain  Chronic bilateral low back pain without sciatica  Rationale for Evaluation and Treatment Rehabilitation  ONSET DATE: Hip Revision 01/04/2022 Initial surgery was March 7th    SUBJECTIVE:                                                                                                                                                                                      SUBJECTIVE STATEMENT:  Pt  reports she drove herself today to session.  She states she was cleared to ride (stationary bike), but hasn't tried it - only suspended cycling motion in deeper water.    PERTINENT HISTORY: Charcott foot, Left hip replacement and revision; right hip revision, Scoliosis, DDD, Lumbar compression fracture; Right Shoulder RTC tear( repaired 2x) Rheumatoid arthritis ( although tests were negative; R foot pain;   PAIN:  Are you having pain? Yes: NPRS scale: 4/10   Pain location: shoulders bilat Pain description: aching  Aggravating  factors: use of her shoulders and pressure  Relieving factors: rest    PRECAUTIONS: No work on the hip at this time   WEIGHT BEARING RESTRICTIONS Yes WBAT   FALLS:  Has patient fallen in last 6 months? Yes. Number of falls had 1 fall prior to her dislocation Since the surgery no falls  No LIVING ENVIRONMENT: No steps at her house  OCCUPATION: Retried   PLOF: Requires assistive device for independence  PATIENT GOALS   OBJECTIVE:   DIAGNOSTIC FINDINGS:  Has had frequent follow up x-rays. Per patient everything looks good   PATIENT SURVEYS:  FOTO    COGNITION:  Overall cognitive status: Within functional limits for tasks assessed     SENSATION: WFL  POSTURE: Raised shoulders bilateral   UPPER EXTREMITY ROM:   MMT ROM Right eval Left eval Right  Left   Shoulder flexion 4/5 4+/5 4+/5 11.1 4+/5 11.7  Shoulder extension      Shoulder abduction      Shoulder adduction      Shoulder internal rotation 4+/5 4+/5 9.9 13.1  Shoulder external rotation 3+/5 3+/5  9.8 10.3  Elbow flexion      Elbow extension      Wrist flexion      Wrist extension      Wrist ulnar deviation      Wrist radial deviation      Wrist pronation      Wrist supination      (Blank rows = not tested)  UPPER EXTREMITY MMT:  AROM  Right eval Left eval Right  Left   Shoulder flexion 94 140 100 148  Shoulder extension      Shoulder abduction      Shoulder  adduction      Shoulder internal rotation Full  Full     Shoulder external rotation Can reach behind her head but needs to put her head forward  Can reach behind her head but needs to put her head forward Improved  Improved   Middle trapezius      Lower trapezius      Elbow flexion      Elbow extension      Wrist flexion      Wrist extension      Wrist ulnar deviation      Wrist radial deviation      Wrist pronation      Wrist supination      Grip strength (lbs)      (Blank rows = not tested)  SHOULDER SPECIAL TESTS:    PALPATION:  Tenderness to palpation in the upper back    TODAY'S TREATMENT:  9/7 Pt seen for aquatic therapy today.  Treatment took place in water 3.25-4.5 ft in depth at the [place]. Temp of water was 92.  Pt entered/exited the pool via stairs independently with bilat rail.  Straddling yellow noodle: gentle cycling with breast stroke arms forward and backward Wall push up / push offs x 20 Pelvic ROM standing holding to wall: hip hiking; ant/post tilts Kick board row 2x15 with LE staggered Ue: red hand paddle chest deep: horizontal add/abd; shoulder flex through full ext; add/abd; IR/ ER; bicep/tricep; modified ai chi gathering Plank on yellow noodle 3x 20s hold UE pull down in plank x5.  Straddling yellow noodle: gentle cycling with breast stroke arms forward and backward   Pt requires the buoyancy and hydrostatic pressure of water for support, and to offload joints by unweighting joint load by at least  50 % in navel deep water and by at least 75-80% in chest to neck deep water.  Viscosity of the water is needed for resistance of strengthening. Water current perturbations provides challenge to standing balance requiring increased core activ  9/5  Bilateral er 2x15 yellow  Bilateral horizontal abduction 2x15 yellow  Bilateral flexion  2x15 yellow band   Curls 2x15 2lbs  Hammer cruls 2x15   Supine wand flexion 2x20  Supine ABC 1x each  shoulder   Reviewed HEP; measurements; goals going forward and functional goals   PATIENT EDUCATION: Education details: reviewed HEP; symptom management; importance of following hip precautions and recommendations  Person educated: Patient Education method: Explanation Education comprehension: verbalized understanding, returned demonstration, verbal cues required, tactile cues required, and needs further education   HOME EXERCISE PROGRAM:  [id number]   ASSESSMENT:  CLINICAL IMPRESSION: Pt had difficulty with staggered stance position for modified Ai Chi gathering, reporting tightening in inner thighs.  All other exercises were tolerated well.  Pt will  benefit from continued PT intervention to maximize functional mobility.     OBJECTIVE IMPAIRMENTS Abnormal gait, decreased activity tolerance, decreased balance, decreased mobility, difficulty walking, decreased ROM, decreased strength, increased fascial restrictions, impaired UE functional use, and pain.   ACTIVITY LIMITATIONS carrying, lifting, bending, standing, squatting, stairs, transfers, bed mobility, reach over head, and locomotion level  PARTICIPATION LIMITATIONS: meal prep, cleaning, driving, shopping, community activity, occupation, and yard work  PERSONAL FACTORS 3+ comorbidities:  hip dislocation RTC tears; scoliosis; DDD of multiple joints; comression fx of the spine   are also affecting patient's functional outcome.   REHAB POTENTIAL: Good  CLINICAL DECISION MAKING: Unstable/unpredictable hip has dislocated since surgery; flutuating pain levels   EVALUATION COMPLEXITY: High   GOALS: Goals reviewed with patient? Yes  SHORT TERM GOALS: Target date: 9/5 Patient will increase right active shoulder flexion to 100 degree s Baseline: improving flexion bilateral  Goal status: ongoing   2.  Patient will increase gross right shoulder strength by 5 lbs  Baseline:  Goal status: just measured with dyno today   3.   Patient will report a 50% reduction of pain in her shoulders while using her walker  Baseline:  Goal status: still pain ful ongoing     LONG TERM GOALS: Target date:06/01/22  Patent will be independent with an UE shoulder strength and stretching program  Baseline:  Goal status: progressing   2.  Patient will use both arms for mobility and ADL's with a 75% self reported reduction in pain Baseline:  Goal status: still having pain but improved   3.  Patient will lift her walker into and out of the car to improve her ability to perform community activity. Goal New    PLAN: PT FREQUENCY: 2x/week  PT DURATION: 8 weeks  PLANNED INTERVENTIONS: Therapeutic exercises, Therapeutic activity, Neuromuscular re-education, Balance training, Gait training, Patient/Family education, Self Care, Joint mobilization, Aquatic Therapy, Dry Needling, Electrical stimulation, Cryotherapy, Moist heat, Taping, and Manual therapy  PLAN FOR NEXT SESSION: in the pool and land work on ROM, scap stability shoulder strengthening; manual therapy to the neck. Add in general mobility when cleared for the hip  Kerin Perna, PTA 04/08/22 11:27 AM

## 2022-04-13 ENCOUNTER — Encounter (HOSPITAL_BASED_OUTPATIENT_CLINIC_OR_DEPARTMENT_OTHER): Payer: Self-pay | Admitting: Physical Therapy

## 2022-04-13 ENCOUNTER — Ambulatory Visit (HOSPITAL_BASED_OUTPATIENT_CLINIC_OR_DEPARTMENT_OTHER): Payer: Medicare Other | Admitting: Physical Therapy

## 2022-04-13 DIAGNOSIS — M545 Low back pain, unspecified: Secondary | ICD-10-CM | POA: Diagnosis not present

## 2022-04-13 DIAGNOSIS — R2689 Other abnormalities of gait and mobility: Secondary | ICD-10-CM | POA: Diagnosis not present

## 2022-04-13 DIAGNOSIS — G8929 Other chronic pain: Secondary | ICD-10-CM

## 2022-04-13 DIAGNOSIS — M25552 Pain in left hip: Secondary | ICD-10-CM

## 2022-04-13 DIAGNOSIS — M25511 Pain in right shoulder: Secondary | ICD-10-CM | POA: Diagnosis not present

## 2022-04-13 DIAGNOSIS — M25551 Pain in right hip: Secondary | ICD-10-CM | POA: Diagnosis not present

## 2022-04-13 NOTE — Therapy (Signed)
OUTPATIENT PHYSICAL THERAPY SHOULDER TREATMENT NOTE   Patient Name: Virginia Gordon MRN: [id number] DOB:November 07, 1950, 71 y.o., female Today's Date: 04/13/2022   PT End of Session - 04/13/22 1348     Visit Number 12    Number of Visits 26    Date for PT Re-Evaluation 06/01/22    Authorization Type Progress note done on the 20th visit    PT Start Time 1345    PT Stop Time 1428    PT Time Calculation (min) 43 min    Activity Tolerance Patient tolerated treatment well    Behavior During Therapy [facility] for tasks assessed/performed                Past Medical History:  Diagnosis Date   Rheumatoid arthritis ([place])    Past Surgical History:  Procedure Laterality Date   BREAST BIOPSY Left    pt not sure of date.   HIP SURGERY     SHOULDER SURGERY     Patient Active Problem List   Diagnosis Date Noted   Effusion of left knee joint 08/17/2021   ADD (attention deficit disorder) 11/13/2015   Connective tissue disease overlap syndrome ([place]) 11/13/2015   Menopausal and perimenopausal disorder 11/13/2015   Acne erythematosa 11/13/2015   Disordered sleep 11/13/2015   History of operative procedure on hip 07/03/2015   Left knee pain 05/20/2014   Right foot pain 05/14/2014   Bilateral shoulder pain 05/14/2014   Hypothyroidism 03/08/2014   Degenerative arthritis of hip 03/07/2012    PCP: [name]   REFERRING PROVIDER: [name] MD   REFERRING DIAG: R29.898 (ICD-10-CM) - Other symptoms and signs involving the musculoskeletal system  THERAPY DIAG:  Chronic right shoulder pain  Chronic bilateral low back pain without sciatica  Pain in right hip  Pain in left hip  Other abnormalities of gait and mobility  Rationale for Evaluation and Treatment Rehabilitation  ONSET DATE: Hip Revision 01/04/2022 Initial surgery was March 7th    SUBJECTIVE:                                                                                                                                                                                       SUBJECTIVE STATEMENT:  Pt reports she drove herself today to session.  She states she was cleared to ride (stationary bike), but hasn't tried it - only suspended cycling motion in deeper water.    PERTINENT HISTORY: Charcott foot, Left hip replacement and revision; right hip revision, Scoliosis, DDD, Lumbar compression fracture; Right Shoulder RTC tear( repaired 2x) Rheumatoid arthritis ( although tests were negative; R foot pain;   PAIN:  Are you  having pain? Yes: NPRS scale: 4/10   Pain location: shoulders bilat Pain description: aching  Aggravating factors: use of her shoulders and pressure  Relieving factors: rest    PRECAUTIONS: No work on the hip at this time   WEIGHT BEARING RESTRICTIONS Yes WBAT   FALLS:  Has patient fallen in last 6 months? Yes. Number of falls had 1 fall prior to her dislocation Since the surgery no falls  No LIVING ENVIRONMENT: No steps at her house  OCCUPATION: Retried   PLOF: Requires assistive device for independence  PATIENT GOALS   OBJECTIVE:   DIAGNOSTIC FINDINGS:  Has had frequent follow up x-rays. Per patient everything looks good   PATIENT SURVEYS:  FOTO    COGNITION:  Overall cognitive status: Within functional limits for tasks assessed     SENSATION: WFL  POSTURE: Raised shoulders bilateral   UPPER EXTREMITY ROM:   MMT ROM Right eval Left eval Right  Left   Shoulder flexion 4/5 4+/5 4+/5 11.1 4+/5 11.7  Shoulder extension      Shoulder abduction      Shoulder adduction      Shoulder internal rotation 4+/5 4+/5 9.9 13.1  Shoulder external rotation 3+/5 3+/5  9.8 10.3  Elbow flexion      Elbow extension      Wrist flexion      Wrist extension      Wrist ulnar deviation      Wrist radial deviation      Wrist pronation      Wrist supination      (Blank rows = not tested)  UPPER EXTREMITY MMT:  AROM  Right eval Left eval Right  Left   Shoulder  flexion 94 140 100 148  Shoulder extension      Shoulder abduction      Shoulder adduction      Shoulder internal rotation Full  Full     Shoulder external rotation Can reach behind her head but needs to put her head forward  Can reach behind her head but needs to put her head forward Improved  Improved   Middle trapezius      Lower trapezius      Elbow flexion      Elbow extension      Wrist flexion      Wrist extension      Wrist ulnar deviation      Wrist radial deviation      Wrist pronation      Wrist supination      Grip strength (lbs)      (Blank rows = not tested)  SHOULDER SPECIAL TESTS:    PALPATION:  Tenderness to palpation in the upper back    TODAY'S TREATMENT:  9/12 PROM into flexion and ER. Improved crepitus into ER with manual therapy  Garde II and II Inferior and posterior glides Trigger point release to upper trap   Bicpes curl 2x15 2lbs  Seated row 2x20 red  Seated shoulder extensions 2x20 red  Bilateral er x10 red. Patient had some difficulty so she was given free weights> She did better with free weights.   Supine wand flexion x10 1lb  X10 2lbs   Abc attempted 2lbs but did better with 1   9/7 Pt seen for aquatic therapy today.  Treatment took place in water 3.25-4.5 ft in depth at the [place]. Temp of water was 92.  Pt entered/exited the pool via stairs independently with bilat rail.  Straddling yellow noodle:  gentle cycling with breast stroke arms forward and backward Wall push up / push offs x 20 Pelvic ROM standing holding to wall: hip hiking; ant/post tilts Kick board row 2x15 with LE staggered Ue: red hand paddle chest deep: horizontal add/abd; shoulder flex through full ext; add/abd; IR/ ER; bicep/tricep; modified ai chi gathering Plank on yellow noodle 3x 20s hold UE pull down in plank x5.  Straddling yellow noodle: gentle cycling with breast stroke arms forward and backward   Pt requires the buoyancy and hydrostatic  pressure of water for support, and to offload joints by unweighting joint load by at least 50 % in navel deep water and by at least 75-80% in chest to neck deep water.  Viscosity of the water is needed for resistance of strengthening. Water current perturbations provides challenge to standing balance requiring increased core activ  9/5  Bilateral er 2x15 yellow  Bilateral horizontal abduction 2x15 yellow  Bilateral flexion  2x15 yellow band   Curls 2x15 2lbs  Hammer cruls 2x15   Supine wand flexion 2x20  Supine ABC 1x each shoulder   Reviewed HEP; measurements; goals going forward and functional goals   PATIENT EDUCATION: Education details: reviewed HEP; symptom management; importance of following hip precautions and recommendations  Person educated: Patient Education method: Explanation Education comprehension: verbalized understanding, returned demonstration, verbal cues required, tactile cues required, and needs further education   HOME EXERCISE PROGRAM:  [id number]   ASSESSMENT:  CLINICAL IMPRESSION: The patient tolerated treatment well. She had improved pain free motion with manual therapy. We reviewed trigger points on her shoulder. We reviewed both dumbbell and band exercises. She had trigger points in her upper trap. He active motion does well in supine bt its limited sitting. Therapy will continue to advance as tolerated.  OBJECTIVE IMPAIRMENTS Abnormal gait, decreased activity tolerance, decreased balance, decreased mobility, difficulty walking, decreased ROM, decreased strength, increased fascial restrictions, impaired UE functional use, and pain.   ACTIVITY LIMITATIONS carrying, lifting, bending, standing, squatting, stairs, transfers, bed mobility, reach over head, and locomotion level  PARTICIPATION LIMITATIONS: meal prep, cleaning, driving, shopping, community activity, occupation, and yard work  PERSONAL FACTORS 3+ comorbidities:  hip dislocation RTC tears;  scoliosis; DDD of multiple joints; comression fx of the spine   are also affecting patient's functional outcome.   REHAB POTENTIAL: Good  CLINICAL DECISION MAKING: Unstable/unpredictable hip has dislocated since surgery; flutuating pain levels   EVALUATION COMPLEXITY: High   GOALS: Goals reviewed with patient? Yes  SHORT TERM GOALS: Target date: 9/5 Patient will increase right active shoulder flexion to 100 degree s Baseline: improving flexion bilateral  Goal status: ongoing   2.  Patient will increase gross right shoulder strength by 5 lbs  Baseline:  Goal status: just measured with dyno today   3.  Patient will report a 50% reduction of pain in her shoulders while using her walker  Baseline:  Goal status: still pain ful ongoing     LONG TERM GOALS: Target date:06/01/22  Patent will be independent with an UE shoulder strength and stretching program  Baseline:  Goal status: progressing   2.  Patient will use both arms for mobility and ADL's with a 75% self reported reduction in pain Baseline:  Goal status: still having pain but improved   3.  Patient will lift her walker into and out of the car to improve her ability to perform community activity. Goal New    PLAN: PT FREQUENCY: 2x/week  PT DURATION: 8 weeks  PLANNED  INTERVENTIONS: Therapeutic exercises, Therapeutic activity, Neuromuscular re-education, Balance training, Gait training, Patient/Family education, Self Care, Joint mobilization, Aquatic Therapy, Dry Needling, Electrical stimulation, Cryotherapy, Moist heat, Taping, and Manual therapy  PLAN FOR NEXT SESSION: in the pool and land work on ROM, scap stability shoulder strengthening; manual therapy to the neck. Add in general mobility when cleared for the hip  Carolyne Littles PT DPT  04/13/22 2:54 PM

## 2022-04-15 ENCOUNTER — Encounter (HOSPITAL_BASED_OUTPATIENT_CLINIC_OR_DEPARTMENT_OTHER): Payer: Self-pay | Admitting: Physical Therapy

## 2022-04-15 ENCOUNTER — Ambulatory Visit (HOSPITAL_BASED_OUTPATIENT_CLINIC_OR_DEPARTMENT_OTHER): Payer: Medicare Other | Admitting: Physical Therapy

## 2022-04-15 DIAGNOSIS — M25511 Pain in right shoulder: Secondary | ICD-10-CM | POA: Diagnosis not present

## 2022-04-15 DIAGNOSIS — M545 Low back pain, unspecified: Secondary | ICD-10-CM | POA: Diagnosis not present

## 2022-04-15 DIAGNOSIS — R2689 Other abnormalities of gait and mobility: Secondary | ICD-10-CM | POA: Diagnosis not present

## 2022-04-15 DIAGNOSIS — G8929 Other chronic pain: Secondary | ICD-10-CM

## 2022-04-15 DIAGNOSIS — M25551 Pain in right hip: Secondary | ICD-10-CM | POA: Diagnosis not present

## 2022-04-15 DIAGNOSIS — M25552 Pain in left hip: Secondary | ICD-10-CM | POA: Diagnosis not present

## 2022-04-15 NOTE — Therapy (Signed)
OUTPATIENT PHYSICAL THERAPY SHOULDER TREATMENT NOTE   Patient Name: Virginia Gordon MRN: [id number] DOB:July 08, 1951, 71 y.o., female Today's Date: 04/15/2022   PT End of Session - 04/15/22 1040     Visit Number 13    Number of Visits 26    Date for PT Re-Evaluation 06/01/22    Authorization Type Progress note done on the 20th visit    PT Start Time 1030    PT Stop Time 1110    PT Time Calculation (min) 40 min    Activity Tolerance Patient tolerated treatment well    Behavior During Therapy [facility] for tasks assessed/performed                Past Medical History:  Diagnosis Date   Rheumatoid arthritis ([place])    Past Surgical History:  Procedure Laterality Date   BREAST BIOPSY Left    pt not sure of date.   HIP SURGERY     SHOULDER SURGERY     Patient Active Problem List   Diagnosis Date Noted   Effusion of left knee joint 08/17/2021   ADD (attention deficit disorder) 11/13/2015   Connective tissue disease overlap syndrome ([place]) 11/13/2015   Menopausal and perimenopausal disorder 11/13/2015   Acne erythematosa 11/13/2015   Disordered sleep 11/13/2015   History of operative procedure on hip 07/03/2015   Left knee pain 05/20/2014   Right foot pain 05/14/2014   Bilateral shoulder pain 05/14/2014   Hypothyroidism 03/08/2014   Degenerative arthritis of hip 03/07/2012    PCP: [name]   REFERRING PROVIDER: [name] MD   REFERRING DIAG: R29.898 (ICD-10-CM) - Other symptoms and signs involving the musculoskeletal system  THERAPY DIAG:  Chronic right shoulder pain  Chronic bilateral low back pain without sciatica  Rationale for Evaluation and Treatment Rehabilitation  ONSET DATE: Hip Revision 01/04/2022 Initial surgery was March 7th    SUBJECTIVE:                                                                                                                                                                                      SUBJECTIVE  STATEMENT:  Pt reports the TPR on her upper trap helped. She has noticed if she warms up in deep water (suspended on noodle) for a few laps, it "really helps with swimming".     PERTINENT HISTORY: Charcott foot, Left hip replacement and revision; right hip revision, Scoliosis, DDD, Lumbar compression fracture; Right Shoulder RTC tear( repaired 2x) Rheumatoid arthritis ( although tests were negative; R foot pain;   PAIN:  Are you having pain? yes: NPRS scale: 2/10   Pain location: shoulders bilat Pain description: aching  Aggravating factors: use of her shoulders and pressure  Relieving factors: rest    PRECAUTIONS: No work on the hip at this time   WEIGHT BEARING RESTRICTIONS Yes WBAT   FALLS:  Has patient fallen in last 6 months? Yes. Number of falls had 1 fall prior to her dislocation Since the surgery no falls  No LIVING ENVIRONMENT: No steps at her house  OCCUPATION: Retried   PLOF: Requires assistive device for independence  PATIENT GOALS   OBJECTIVE:   DIAGNOSTIC FINDINGS:  Has had frequent follow up x-rays. Per patient everything looks good   PATIENT SURVEYS:  FOTO    COGNITION:  Overall cognitive status: Within functional limits for tasks assessed     SENSATION: WFL  POSTURE: Raised shoulders bilateral   UPPER EXTREMITY ROM:   MMT ROM Right eval Left eval Right  Left   Shoulder flexion 4/5 4+/5 4+/5 11.1 4+/5 11.7  Shoulder extension      Shoulder abduction      Shoulder adduction      Shoulder internal rotation 4+/5 4+/5 9.9 13.1  Shoulder external rotation 3+/5 3+/5  9.8 10.3  Elbow flexion      Elbow extension      Wrist flexion      Wrist extension      Wrist ulnar deviation      Wrist radial deviation      Wrist pronation      Wrist supination      (Blank rows = not tested)  UPPER EXTREMITY MMT:  AROM  Right eval Left eval Right  Left   Shoulder flexion 94 140 100 148  Shoulder extension      Shoulder abduction      Shoulder  adduction      Shoulder internal rotation Full  Full     Shoulder external rotation Can reach behind her head but needs to put her head forward  Can reach behind her head but needs to put her head forward Improved  Improved   Middle trapezius      Lower trapezius      Elbow flexion      Elbow extension      Wrist flexion      Wrist extension      Wrist ulnar deviation      Wrist radial deviation      Wrist pronation      Wrist supination      Grip strength (lbs)      (Blank rows = not tested)  SHOULDER SPECIAL TESTS:    PALPATION:  Tenderness to palpation in the upper back    TODAY'S TREATMENT:  9/14 Pt seen for aquatic therapy today.  Treatment took place in water 3.25-4.5 ft in depth at the [place]. Temp of water was 91.  Pt entered/exited the pool via stairs independently with single rail.  Straddling yellow noodle: gentle cycling with breast stroke arms forward and backward, arm abdct/ add Seated on bench:  blue short noodle submerged with upright posture and core engaged, 5 sec holds; red small paddles donned: horizontal add/abd; shoulder flex through to neutral; add/abd; IR/ ER; bicep/tricep; and variations with one UE moving at a time with focus on posture, core engagement. Plank on yellow noodle 3x 20s hold - > switched to white barbell  UE pull down in plank holding white barbell x 5;  Plank on bench with very small hip ext  Wall push up / push offs x 20 at 4 ft  Pt requires the buoyancy and hydrostatic pressure of water for support, and to offload joints by unweighting joint load by at least 50 % in navel deep water and by at least 75-80% in chest to neck deep water.  Viscosity of the water is needed for resistance of strengthening. Water current perturbations provides challenge to standing balance requiring increased core activation.  9/12 PROM into flexion and ER. Improved crepitus into ER with manual therapy  Garde II and II Inferior and posterior  glides Trigger point release to upper trap   Bicpes curl 2x15 2lbs  Seated row 2x20 red  Seated shoulder extensions 2x20 red  Bilateral er x10 red. Patient had some difficulty so she was given free weights> She did better with free weights.   Supine wand flexion x10 1lb  X10 2lbs   Abc attempted 2lbs but did better with 1   9/7 Pt seen for aquatic therapy today.  Treatment took place in water 3.25-4.5 ft in depth at the [place]. Temp of water was 92.  Pt entered/exited the pool via stairs independently with bilat rail.  PATIENT EDUCATION: Education details: reviewed HEP; symptom management; importance of following hip precautions and recommendations  Person educated: Patient Education method: Explanation Education comprehension: verbalized understanding, returned demonstration, verbal cues required, tactile cues required, and needs further education   HOME EXERCISE PROGRAM:  [id number]   ASSESSMENT:  CLINICAL IMPRESSION: Pt reported soreness in UE up to 4/10 when warming up in deeper water. No further report of soreness with use of paddles with arm strengthening.  She required minor cues for upright posture with noodle press and seated shoulder work, working on engaging posterior shoulder girdle and core musculature. Therapy will continue to advance as tolerated.    OBJECTIVE IMPAIRMENTS Abnormal gait, decreased activity tolerance, decreased balance, decreased mobility, difficulty walking, decreased ROM, decreased strength, increased fascial restrictions, impaired UE functional use, and pain.   ACTIVITY LIMITATIONS carrying, lifting, bending, standing, squatting, stairs, transfers, bed mobility, reach over head, and locomotion level  PARTICIPATION LIMITATIONS: meal prep, cleaning, driving, shopping, community activity, occupation, and yard work  PERSONAL FACTORS 3+ comorbidities:  hip dislocation RTC tears; scoliosis; DDD of multiple joints; comression fx of  the spine   are also affecting patient's functional outcome.   REHAB POTENTIAL: Good  CLINICAL DECISION MAKING: Unstable/unpredictable hip has dislocated since surgery; flutuating pain levels   EVALUATION COMPLEXITY: High   GOALS: Goals reviewed with patient? Yes  SHORT TERM GOALS: Target date: 9/5 Patient will increase right active shoulder flexion to 100 degree s Baseline: improving flexion bilateral  Goal status: ongoing   2.  Patient will increase gross right shoulder strength by 5 lbs  Baseline:  Goal status: just measured with dyno today   3.  Patient will report a 50% reduction of pain in her shoulders while using her walker  Baseline:  Goal status: still pain ful ongoing     LONG TERM GOALS: Target date:06/01/22  Patent will be independent with an UE shoulder strength and stretching program  Baseline:  Goal status: progressing   2.  Patient will use both arms for mobility and ADL's with a 75% self reported reduction in pain Baseline:  Goal status: still having pain but improved   3.  Patient will lift her walker into and out of the car to improve her ability to perform community activity. Baseline:  Goal Status:  New    PLAN: PT FREQUENCY: 2x/week  PT DURATION: 8 weeks  PLANNED  INTERVENTIONS: Therapeutic exercises, Therapeutic activity, Neuromuscular re-education, Balance training, Gait training, Patient/Family education, Self Care, Joint mobilization, Aquatic Therapy, Dry Needling, Electrical stimulation, Cryotherapy, Moist heat, Taping, and Manual therapy  PLAN FOR NEXT SESSION: in the pool and land work on ROM, scap stability shoulder strengthening; manual therapy to the neck. Add in general mobility when cleared for the hip  Kerin Perna, PTA 04/15/22 11:20 AM [place], [place], [address] Phone: [phone]   Fax:  [phone]

## 2022-04-20 ENCOUNTER — Encounter (HOSPITAL_BASED_OUTPATIENT_CLINIC_OR_DEPARTMENT_OTHER): Payer: Self-pay | Admitting: Physical Therapy

## 2022-04-20 ENCOUNTER — Ambulatory Visit (HOSPITAL_BASED_OUTPATIENT_CLINIC_OR_DEPARTMENT_OTHER): Payer: Medicare Other | Admitting: Physical Therapy

## 2022-04-20 DIAGNOSIS — M25552 Pain in left hip: Secondary | ICD-10-CM | POA: Diagnosis not present

## 2022-04-20 DIAGNOSIS — M25551 Pain in right hip: Secondary | ICD-10-CM | POA: Diagnosis not present

## 2022-04-20 DIAGNOSIS — R2689 Other abnormalities of gait and mobility: Secondary | ICD-10-CM

## 2022-04-20 DIAGNOSIS — G8929 Other chronic pain: Secondary | ICD-10-CM | POA: Diagnosis not present

## 2022-04-20 DIAGNOSIS — M25511 Pain in right shoulder: Secondary | ICD-10-CM | POA: Diagnosis not present

## 2022-04-20 DIAGNOSIS — M545 Low back pain, unspecified: Secondary | ICD-10-CM | POA: Diagnosis not present

## 2022-04-20 NOTE — Therapy (Signed)
OUTPATIENT PHYSICAL THERAPY SHOULDER TREATMENT NOTE   Patient Name: Virginia Gordon MRN: [id number] DOB:06/05/1951, 71 y.o., female Today's Date: 04/20/2022   PT End of Session - 04/20/22 1352     Visit Number 14    Number of Visits 26    Date for PT Re-Evaluation 06/01/22    Authorization Type Progress note done on the 20th visit    PT Start Time 1349    PT Stop Time 1430    PT Time Calculation (min) 41 min    Activity Tolerance Patient tolerated treatment well    Behavior During Therapy [facility] for tasks assessed/performed                Past Medical History:  Diagnosis Date   Rheumatoid arthritis ([place])    Past Surgical History:  Procedure Laterality Date   BREAST BIOPSY Left    pt not sure of date.   HIP SURGERY     SHOULDER SURGERY     Patient Active Problem List   Diagnosis Date Noted   Effusion of left knee joint 08/17/2021   ADD (attention deficit disorder) 11/13/2015   Connective tissue disease overlap syndrome ([place]) 11/13/2015   Menopausal and perimenopausal disorder 11/13/2015   Acne erythematosa 11/13/2015   Disordered sleep 11/13/2015   History of operative procedure on hip 07/03/2015   Left knee pain 05/20/2014   Right foot pain 05/14/2014   Bilateral shoulder pain 05/14/2014   Hypothyroidism 03/08/2014   Degenerative arthritis of hip 03/07/2012    PCP: [name]   REFERRING PROVIDER: [name] MD   REFERRING DIAG: R29.898 (ICD-10-CM) - Other symptoms and signs involving the musculoskeletal system  THERAPY DIAG:  Chronic right shoulder pain  Chronic bilateral low back pain without sciatica  Pain in right hip  Pain in left hip  Other abnormalities of gait and mobility  Rationale for Evaluation and Treatment Rehabilitation  ONSET DATE: Hip Revision 01/04/2022 Initial surgery was March 7th    SUBJECTIVE:                                                                                                                                                                                       SUBJECTIVE STATEMENT:  Pt reports the TPR on her upper trap helped. She has noticed if she warms up in deep water (suspended on noodle) for a few laps, it "really helps with swimming".     PERTINENT HISTORY: Charcott foot, Left hip replacement and revision; right hip revision, Scoliosis, DDD, Lumbar compression fracture; Right Shoulder RTC tear( repaired 2x) Rheumatoid arthritis ( although tests were negative; R foot pain;   PAIN:  Are you having pain? yes: NPRS scale: 2/10   Pain location: shoulders bilat Pain description: aching  Aggravating factors: use of her shoulders and pressure  Relieving factors: rest    PRECAUTIONS: No work on the hip at this time   WEIGHT BEARING RESTRICTIONS Yes WBAT   FALLS:  Has patient fallen in last 6 months? Yes. Number of falls had 1 fall prior to her dislocation Since the surgery no falls  No LIVING ENVIRONMENT: No steps at her house  OCCUPATION: Retried   PLOF: Requires assistive device for independence  PATIENT GOALS   OBJECTIVE:   DIAGNOSTIC FINDINGS:  Has had frequent follow up x-rays. Per patient everything looks good   PATIENT SURVEYS:  FOTO    COGNITION:  Overall cognitive status: Within functional limits for tasks assessed     SENSATION: WFL  POSTURE: Raised shoulders bilateral   UPPER EXTREMITY ROM:   MMT ROM Right eval Left eval Right  Left   Shoulder flexion 4/5 4+/5 4+/5 11.1 4+/5 11.7  Shoulder extension      Shoulder abduction      Shoulder adduction      Shoulder internal rotation 4+/5 4+/5 9.9 13.1  Shoulder external rotation 3+/5 3+/5  9.8 10.3  Elbow flexion      Elbow extension      Wrist flexion      Wrist extension      Wrist ulnar deviation      Wrist radial deviation      Wrist pronation      Wrist supination      (Blank rows = not tested)  UPPER EXTREMITY MMT:  AROM  Right eval Left eval Right  Left   Shoulder flexion 94 140  100 148  Shoulder extension      Shoulder abduction      Shoulder adduction      Shoulder internal rotation Full  Full     Shoulder external rotation Can reach behind her head but needs to put her head forward  Can reach behind her head but needs to put her head forward Improved  Improved   Middle trapezius      Lower trapezius      Elbow flexion      Elbow extension      Wrist flexion      Wrist extension      Wrist ulnar deviation      Wrist radial deviation      Wrist pronation      Wrist supination      Grip strength (lbs)      (Blank rows = not tested)  SHOULDER SPECIAL TESTS:    PALPATION:  Tenderness to palpation in the upper back    TODAY'S TREATMENT:  9/19 Manual: Trigger point release to upper trap; PROM into ER and flexion    Wand flexion 2lbs x2x15 1 lb x15  Serratus press 3x15 3lbs   Chest press 3x15 lbs  Biceps curl 3x15  3lbs  Row 2x20 green  Shoulder extension 2x20 green     9/14 Pt seen for aquatic therapy today.  Treatment took place in water 3.25-4.5 ft in depth at the [place]. Temp of water was 91.  Pt entered/exited the pool via stairs independently with single rail.  Straddling yellow noodle: gentle cycling with breast stroke arms forward and backward, arm abdct/ add Seated on bench:  blue short noodle submerged with upright posture and core engaged, 5 sec holds; red small paddles donned:  horizontal add/abd; shoulder flex through to neutral; add/abd; IR/ ER; bicep/tricep; and variations with one UE moving at a time with focus on posture, core engagement. Plank on yellow noodle 3x 20s hold - > switched to white barbell  UE pull down in plank holding white barbell x 5;  Plank on bench with very small hip ext  Wall push up / push offs x 20 at 4 ft  Pt requires the buoyancy and hydrostatic pressure of water for support, and to offload joints by unweighting joint load by at least 50 % in navel deep water and by at least 75-80% in  chest to neck deep water.  Viscosity of the water is needed for resistance of strengthening. Water current perturbations provides challenge to standing balance requiring increased core activation.  9/12 PROM into flexion and ER. Improved crepitus into ER with manual therapy  Garde II and II Inferior and posterior glides Trigger point release to upper trap   Bicpes curl 2x15 2lbs  Seated row 2x20 red  Seated shoulder extensions 2x20 red  Bilateral er x10 red. Patient had some difficulty so she was given free weights> She did better with free weights.   Supine wand flexion x10 1lb  X10 2lbs   Abc attempted 2lbs but did better with 1   9/7 Pt seen for aquatic therapy today.  Treatment took place in water 3.25-4.5 ft in depth at the [place]. Temp of water was 92.  Pt entered/exited the pool via stairs independently with bilat rail.  PATIENT EDUCATION: Education details: reviewed HEP; symptom management; importance of following hip precautions and recommendations  Person educated: Patient Education method: Explanation Education comprehension: verbalized understanding, returned demonstration, verbal cues required, tactile cues required, and needs further education   HOME EXERCISE PROGRAM:  [id number]   ASSESSMENT:  CLINICAL IMPRESSION: The patient tolerated treatment well. We reviewed supine exercises. She tolerated well. We gave her serratus punches with a bar. She had no increase in pain. We were able to advance some of the weights with her exercises. She continues ot have a large trigger point in her upper trap. Therapy will continue to progress as tolerated.   OBJECTIVE IMPAIRMENTS Abnormal gait, decreased activity tolerance, decreased balance, decreased mobility, difficulty walking, decreased ROM, decreased strength, increased fascial restrictions, impaired UE functional use, and pain.   ACTIVITY LIMITATIONS carrying, lifting, bending, standing, squatting,  stairs, transfers, bed mobility, reach over head, and locomotion level  PARTICIPATION LIMITATIONS: meal prep, cleaning, driving, shopping, community activity, occupation, and yard work  PERSONAL FACTORS 3+ comorbidities:  hip dislocation RTC tears; scoliosis; DDD of multiple joints; comression fx of the spine   are also affecting patient's functional outcome.   REHAB POTENTIAL: Good  CLINICAL DECISION MAKING: Unstable/unpredictable hip has dislocated since surgery; flutuating pain levels   EVALUATION COMPLEXITY: High   GOALS: Goals reviewed with patient? Yes  SHORT TERM GOALS: Target date: 9/5 Patient will increase right active shoulder flexion to 100 degree s Baseline: improving flexion bilateral  Goal status: ongoing   2.  Patient will increase gross right shoulder strength by 5 lbs  Baseline:  Goal status: just measured with dyno today   3.  Patient will report a 50% reduction of pain in her shoulders while using her walker  Baseline:  Goal status: still pain ful ongoing     LONG TERM GOALS: Target date:06/01/22  Patent will be independent with an UE shoulder strength and stretching program  Baseline:  Goal status: progressing  2.  Patient will use both arms for mobility and ADL's with a 75% self reported reduction in pain Baseline:  Goal status: still having pain but improved   3.  Patient will lift her walker into and out of the car to improve her ability to perform community activity. Baseline:  Goal Status:  New    PLAN: PT FREQUENCY: 2x/week  PT DURATION: 8 weeks  PLANNED INTERVENTIONS: Therapeutic exercises, Therapeutic activity, Neuromuscular re-education, Balance training, Gait training, Patient/Family education, Self Care, Joint mobilization, Aquatic Therapy, Dry Needling, Electrical stimulation, Cryotherapy, Moist heat, Taping, and Manual therapy  PLAN FOR NEXT SESSION: in the pool and land work on ROM, scap stability shoulder strengthening; manual  therapy to the neck. Add in general mobility when cleared for the hip  Carolyne Littles PT DPT  04/20/22 2:07 PM [place], [place], [address] Phone: [phone]   Fax:  [phone]

## 2022-04-22 ENCOUNTER — Ambulatory Visit (HOSPITAL_BASED_OUTPATIENT_CLINIC_OR_DEPARTMENT_OTHER): Payer: Medicare Other | Admitting: Physical Therapy

## 2022-04-22 DIAGNOSIS — M47816 Spondylosis without myelopathy or radiculopathy, lumbar region: Secondary | ICD-10-CM | POA: Diagnosis not present

## 2022-04-22 DIAGNOSIS — M5417 Radiculopathy, lumbosacral region: Secondary | ICD-10-CM | POA: Diagnosis not present

## 2022-04-22 DIAGNOSIS — M533 Sacrococcygeal disorders, not elsewhere classified: Secondary | ICD-10-CM | POA: Diagnosis not present

## 2022-04-22 DIAGNOSIS — M545 Low back pain, unspecified: Secondary | ICD-10-CM | POA: Diagnosis not present

## 2022-04-22 DIAGNOSIS — M7918 Myalgia, other site: Secondary | ICD-10-CM | POA: Diagnosis not present

## 2022-04-27 ENCOUNTER — Encounter (HOSPITAL_BASED_OUTPATIENT_CLINIC_OR_DEPARTMENT_OTHER): Payer: Self-pay | Admitting: Physical Therapy

## 2022-04-27 ENCOUNTER — Ambulatory Visit (HOSPITAL_BASED_OUTPATIENT_CLINIC_OR_DEPARTMENT_OTHER): Payer: Medicare Other | Admitting: Physical Therapy

## 2022-04-27 DIAGNOSIS — M545 Low back pain, unspecified: Secondary | ICD-10-CM | POA: Diagnosis not present

## 2022-04-27 DIAGNOSIS — M25552 Pain in left hip: Secondary | ICD-10-CM | POA: Diagnosis not present

## 2022-04-27 DIAGNOSIS — G8929 Other chronic pain: Secondary | ICD-10-CM

## 2022-04-27 DIAGNOSIS — M25511 Pain in right shoulder: Secondary | ICD-10-CM | POA: Diagnosis not present

## 2022-04-27 DIAGNOSIS — M25551 Pain in right hip: Secondary | ICD-10-CM | POA: Diagnosis not present

## 2022-04-27 DIAGNOSIS — R2689 Other abnormalities of gait and mobility: Secondary | ICD-10-CM | POA: Diagnosis not present

## 2022-04-27 NOTE — Therapy (Signed)
OUTPATIENT PHYSICAL THERAPY SHOULDER TREATMENT NOTE   Patient Name: Virginia Gordon MRN: [id number] DOB:06-Jun-1951, 71 y.o., female Today's Date: 04/27/2022   PT End of Session - 04/27/22 1419     Visit Number 15    Number of Visits 26    Date for PT Re-Evaluation 06/01/22    PT Start Time 1345    PT Stop Time 1424    PT Time Calculation (min) 39 min    Activity Tolerance Patient tolerated treatment well    Behavior During Therapy WFL for tasks assessed/performed                Past Medical History:  Diagnosis Date   Rheumatoid arthritis ([place])    Past Surgical History:  Procedure Laterality Date   BREAST BIOPSY Left    pt not sure of date.   HIP SURGERY     SHOULDER SURGERY     Patient Active Problem List   Diagnosis Date Noted   Effusion of left knee joint 08/17/2021   ADD (attention deficit disorder) 11/13/2015   Connective tissue disease overlap syndrome ([place]) 11/13/2015   Menopausal and perimenopausal disorder 11/13/2015   Acne erythematosa 11/13/2015   Disordered sleep 11/13/2015   History of operative procedure on hip 07/03/2015   Left knee pain 05/20/2014   Right foot pain 05/14/2014   Bilateral shoulder pain 05/14/2014   Hypothyroidism 03/08/2014   Degenerative arthritis of hip 03/07/2012    PCP: [name]   REFERRING PROVIDER: [name] MD   REFERRING DIAG: R29.898 (ICD-10-CM) - Other symptoms and signs involving the musculoskeletal system  THERAPY DIAG:  Chronic right shoulder pain  Rationale for Evaluation and Treatment Rehabilitation  ONSET DATE: Hip Revision 01/04/2022 Initial surgery was March 7th    SUBJECTIVE:                                                                                                                                                                                      SUBJECTIVE STATEMENT:  The patient was sore after the last visit for a day or two.   PERTINENT HISTORY: Charcott foot, Left hip  replacement and revision; right hip revision, Scoliosis, DDD, Lumbar compression fracture; Right Shoulder RTC tear( repaired 2x) Rheumatoid arthritis ( although tests were negative; R foot pain;   PAIN:  Are you having pain? yes: NPRS scale: 2/10   Pain location: shoulders bilat Pain description: aching  Aggravating factors: use of her shoulders and pressure  Relieving factors: rest    PRECAUTIONS: No work on the hip at this time   WEIGHT BEARING RESTRICTIONS Yes WBAT   FALLS:  Has patient fallen in  last 6 months? Yes. Number of falls had 1 fall prior to her dislocation Since the surgery no falls  No LIVING ENVIRONMENT: No steps at her house  OCCUPATION: Retried   PLOF: Requires assistive device for independence  PATIENT GOALS   OBJECTIVE:   DIAGNOSTIC FINDINGS:  Has had frequent follow up x-rays. Per patient everything looks good   PATIENT SURVEYS:  FOTO    COGNITION:  Overall cognitive status: Within functional limits for tasks assessed     SENSATION: WFL  POSTURE: Raised shoulders bilateral   UPPER EXTREMITY ROM:   MMT ROM Right eval Left eval Right  Left   Shoulder flexion 4/5 4+/5 4+/5 11.1 4+/5 11.7  Shoulder extension      Shoulder abduction      Shoulder adduction      Shoulder internal rotation 4+/5 4+/5 9.9 13.1  Shoulder external rotation 3+/5 3+/5  9.8 10.3  Elbow flexion      Elbow extension      Wrist flexion      Wrist extension      Wrist ulnar deviation      Wrist radial deviation      Wrist pronation      Wrist supination      (Blank rows = not tested)  UPPER EXTREMITY MMT:  AROM  Right eval Left eval Right  Left   Shoulder flexion 94 140 100 148  Shoulder extension      Shoulder abduction      Shoulder adduction      Shoulder internal rotation Full  Full     Shoulder external rotation Can reach behind her head but needs to put her head forward  Can reach behind her head but needs to put her head forward Improved  Improved    Middle trapezius      Lower trapezius      Elbow flexion      Elbow extension      Wrist flexion      Wrist extension      Wrist ulnar deviation      Wrist radial deviation      Wrist pronation      Wrist supination      Grip strength (lbs)      (Blank rows = not tested)  SHOULDER SPECIAL TESTS:    PALPATION:  Tenderness to palpation in the upper back    TODAY'S TREATMENT:  9/26 Manual: Trigger point release to upper trap; PROM into ER and flexion    Wand flexion 2lbs x2x15  Serratus press 3x15 3lbs   Chest press 3x15 lbs  Biceps curl 3x15  3lbs  Row 2x20 green  Shoulder extension 2x20 green  Supine ABC 2lbs       9/19 Manual: Trigger point release to upper trap; PROM into ER and flexion    Wand flexion 2lbs x2x15 1 lb x15  Serratus press 3x15 3lbs   Chest press 3x15 lbs  Biceps curl 3x15  3lbs  Row 2x20 green  Shoulder extension 2x20 green  Bilateral ER red 2x10       PATIENT EDUCATION: Education details: reviewed HEP; symptom management; importance of following hip precautions and recommendations  Person educated: Patient Education method: Explanation Education comprehension: verbalized understanding, returned demonstration, verbal cues required, tactile cues required, and needs further education   HOME EXERCISE PROGRAM:  [id number]   ASSESSMENT:  CLINICAL IMPRESSION: The patient tolerated treatment well . We advanced her bilateral er to a red band. She had some  difficulty. She continues to have spasming and trigger points in her posterior shoulder. Therapy will continue to progress as tolerated. She was advised to continue 1x in the pool and one time at land. She is progressing towards having an independent shoulder program.  OBJECTIVE IMPAIRMENTS Abnormal gait, decreased activity tolerance, decreased balance, decreased mobility, difficulty walking, decreased ROM, decreased strength, increased fascial restrictions, impaired UE functional  use, and pain.   ACTIVITY LIMITATIONS carrying, lifting, bending, standing, squatting, stairs, transfers, bed mobility, reach over head, and locomotion level  PARTICIPATION LIMITATIONS: meal prep, cleaning, driving, shopping, community activity, occupation, and yard work  PERSONAL FACTORS 3+ comorbidities:  hip dislocation RTC tears; scoliosis; DDD of multiple joints; comression fx of the spine   are also affecting patient's functional outcome.   REHAB POTENTIAL: Good  CLINICAL DECISION MAKING: Unstable/unpredictable hip has dislocated since surgery; flutuating pain levels   EVALUATION COMPLEXITY: High   GOALS: Goals reviewed with patient? Yes  SHORT TERM GOALS: Target date: 9/5 Patient will increase right active shoulder flexion to 100 degree s Baseline: improving flexion bilateral  Goal status: ongoing   2.  Patient will increase gross right shoulder strength by 5 lbs  Baseline:  Goal status: just measured with dyno today   3.  Patient will report a 50% reduction of pain in her shoulders while using her walker  Baseline:  Goal status: still pain ful ongoing     LONG TERM GOALS: Target date:06/01/22  Patent will be independent with an UE shoulder strength and stretching program  Baseline:  Goal status: progressing   2.  Patient will use both arms for mobility and ADL's with a 75% self reported reduction in pain Baseline:  Goal status: still having pain but improved   3.  Patient will lift her walker into and out of the car to improve her ability to perform community activity. Baseline:  Goal Status:  New    PLAN: PT FREQUENCY: 2x/week  PT DURATION: 8 weeks  PLANNED INTERVENTIONS: Therapeutic exercises, Therapeutic activity, Neuromuscular re-education, Balance training, Gait training, Patient/Family education, Self Care, Joint mobilization, Aquatic Therapy, Dry Needling, Electrical stimulation, Cryotherapy, Moist heat, Taping, and Manual therapy  PLAN FOR NEXT  SESSION: in the pool and land work on ROM, scap stability shoulder strengthening; manual therapy to the neck. Add in general mobility when cleared for the hip  Carolyne Littles PT DPT  04/27/22 3:07 PM [place], [place], [address] Phone: [phone]   Fax:  [phone]

## 2022-04-28 DIAGNOSIS — F902 Attention-deficit hyperactivity disorder, combined type: Secondary | ICD-10-CM | POA: Diagnosis not present

## 2022-04-29 ENCOUNTER — Ambulatory Visit (HOSPITAL_BASED_OUTPATIENT_CLINIC_OR_DEPARTMENT_OTHER): Payer: Medicare Other | Admitting: Physical Therapy

## 2022-04-29 ENCOUNTER — Encounter (HOSPITAL_BASED_OUTPATIENT_CLINIC_OR_DEPARTMENT_OTHER): Payer: Self-pay | Admitting: Physical Therapy

## 2022-04-29 DIAGNOSIS — M25551 Pain in right hip: Secondary | ICD-10-CM

## 2022-04-29 DIAGNOSIS — M25511 Pain in right shoulder: Secondary | ICD-10-CM | POA: Diagnosis not present

## 2022-04-29 DIAGNOSIS — R2689 Other abnormalities of gait and mobility: Secondary | ICD-10-CM | POA: Diagnosis not present

## 2022-04-29 DIAGNOSIS — G8929 Other chronic pain: Secondary | ICD-10-CM | POA: Diagnosis not present

## 2022-04-29 DIAGNOSIS — M25552 Pain in left hip: Secondary | ICD-10-CM | POA: Diagnosis not present

## 2022-04-29 DIAGNOSIS — M545 Low back pain, unspecified: Secondary | ICD-10-CM | POA: Diagnosis not present

## 2022-04-29 NOTE — Therapy (Signed)
OUTPATIENT PHYSICAL THERAPY SHOULDER TREATMENT NOTE   Patient Name: Virginia Gordon MRN: [id number] DOB:April 18, 1951, 71 y.o., female Today's Date: 04/29/2022   PT End of Session - 04/29/22 1130     Visit Number 16    Number of Visits 26    Date for PT Re-Evaluation 06/01/22    Authorization Type Progress note done on the 20th visit    PT Start Time 1031    PT Stop Time 1115    PT Time Calculation (min) 44 min    Activity Tolerance Patient tolerated treatment well    Behavior During Therapy [facility] for tasks assessed/performed                 Past Medical History:  Diagnosis Date   Rheumatoid arthritis ([place])    Past Surgical History:  Procedure Laterality Date   BREAST BIOPSY Left    pt not sure of date.   HIP SURGERY     SHOULDER SURGERY     Patient Active Problem List   Diagnosis Date Noted   Effusion of left knee joint 08/17/2021   ADD (attention deficit disorder) 11/13/2015   Connective tissue disease overlap syndrome ([place]) 11/13/2015   Menopausal and perimenopausal disorder 11/13/2015   Acne erythematosa 11/13/2015   Disordered sleep 11/13/2015   History of operative procedure on hip 07/03/2015   Left knee pain 05/20/2014   Right foot pain 05/14/2014   Bilateral shoulder pain 05/14/2014   Hypothyroidism 03/08/2014   Degenerative arthritis of hip 03/07/2012    PCP: [name]   REFERRING PROVIDER: [name] MD   REFERRING DIAG: R29.898 (ICD-10-CM) - Other symptoms and signs involving the musculoskeletal system  THERAPY DIAG:  Chronic right shoulder pain  Chronic bilateral low back pain without sciatica  Pain in right hip  Pain in left hip  Rationale for Evaluation and Treatment Rehabilitation  ONSET DATE: Hip Revision 01/04/2022 Initial surgery was March 7th    SUBJECTIVE:                                                                                                                                                                                       SUBJECTIVE STATEMENT:  I got the big shot 1 week and I am much better.   Pt states [name] told her she can swim and use recumbent bike  PERTINENT HISTORY: Charcott foot, Left hip replacement and revision; right hip revision, Scoliosis, DDD, Lumbar compression fracture; Right Shoulder RTC tear( repaired 2x) Rheumatoid arthritis ( although tests were negative; R foot pain;   PAIN:  Are you having pain? yes: NPRS scale: 0/10   Pain location: shoulders  bilat Pain description: aching  Aggravating factors: use of her shoulders and pressure  Relieving factors: rest    PRECAUTIONS: No work on the hip at this time   WEIGHT BEARING RESTRICTIONS Yes WBAT   FALLS:  Has patient fallen in last 6 months? Yes. Number of falls had 1 fall prior to her dislocation Since the surgery no falls  No LIVING ENVIRONMENT: No steps at her house  OCCUPATION: Retried   PLOF: Requires assistive device for independence  PATIENT GOALS   OBJECTIVE:   DIAGNOSTIC FINDINGS:  Has had frequent follow up x-rays. Per patient everything looks good   PATIENT SURVEYS:  FOTO    COGNITION:  Overall cognitive status: Within functional limits for tasks assessed     SENSATION: WFL  POSTURE: Raised shoulders bilateral   UPPER EXTREMITY ROM:   MMT ROM Right eval Left eval Right  Left   Shoulder flexion 4/5 4+/5 4+/5 11.1 4+/5 11.7  Shoulder extension      Shoulder abduction      Shoulder adduction      Shoulder internal rotation 4+/5 4+/5 9.9 13.1  Shoulder external rotation 3+/5 3+/5  9.8 10.3  Elbow flexion      Elbow extension      Wrist flexion      Wrist extension      Wrist ulnar deviation      Wrist radial deviation      Wrist pronation      Wrist supination      (Blank rows = not tested)  UPPER EXTREMITY MMT:  AROM  Right eval Left eval Right  Left   Shoulder flexion 94 140 100 148  Shoulder extension      Shoulder abduction      Shoulder adduction       Shoulder internal rotation Full  Full     Shoulder external rotation Can reach behind her head but needs to put her head forward  Can reach behind her head but needs to put her head forward Improved  Improved   Middle trapezius      Lower trapezius      Elbow flexion      Elbow extension      Wrist flexion      Wrist extension      Wrist ulnar deviation      Wrist radial deviation      Wrist pronation      Wrist supination      Grip strength (lbs)      (Blank rows = not tested)  SHOULDER SPECIAL TESTS:    PALPATION:  Tenderness to palpation in the upper back    TODAY'S TREATMENT:  9/28 Pt seen for aquatic therapy today.  Treatment took place in water 3.25-4.5 ft in depth at the [place]. Temp of water was 91.  Pt entered/exited the pool via stairs independently with single rail.   Straddling yellow noodle: gentle cycling with breast stroke arms forward and backward, arm abdct/ add Standing:horizontal add/abd; shoulder flex through to neutral; add/abd; IR/ ER; bicep/tricep. Using 1 foam hand buoy UE alternated with 1 foam buoy and hand paddle red x 10 Seated on bench:  blue short noodle submerged with upright posture and core engaged, 5 sec holds; red small paddles donned:  Plank on white barbell 3x 20s hold   UE pull down in plank holding white barbell 2x 5;  Plank on white barbell with hip extension x 10 Sititng balance for core engagement on sm  squoodle. Holding x 20 sec hands; rhen hands out of water x 20s; progressed to pushing buoy under pt in seated position holding  Wall push up / push offs x 20 at 4 ft   Pt requires the buoyancy and hydrostatic pressure of water for support, and to offload joints by unweighting joint load by at least 50 % in navel deep water and by at least 75-80% in chest to neck deep water.  Viscosity of the water is needed for resistance of strengthening. Water current perturbations provides challenge to standing balance requiring  increased core activation.   9/26 Manual: Trigger point release to upper trap; PROM into ER and flexion    Wand flexion 2lbs x2x15  Serratus press 3x15 3lbs   Chest press 3x15 lbs  Biceps curl 3x15  3lbs  Row 2x20 green  Shoulder extension 2x20 green  Supine ABC 2lbs          PATIENT EDUCATION: Education details: reviewed HEP; symptom management; importance of following hip precautions and recommendations  Person educated: Patient Education method: Explanation Education comprehension: verbalized understanding, returned demonstration, verbal cues required, tactile cues required, and needs further education   HOME EXERCISE PROGRAM:  [id number]   ASSESSMENT:  CLINICAL IMPRESSION: The patient tolerated treatment well . We advanced core strengthening with added plank positions in prone and sup. She instructed on added aquatic exercises adding  sitting balance core engagement on noodle with progression to reverse plank. She demonstrates good focus. She did complain of left shoulder discomfort initially which decreased as session progressed.   OBJECTIVE IMPAIRMENTS Abnormal gait, decreased activity tolerance, decreased balance, decreased mobility, difficulty walking, decreased ROM, decreased strength, increased fascial restrictions, impaired UE functional use, and pain.   ACTIVITY LIMITATIONS carrying, lifting, bending, standing, squatting, stairs, transfers, bed mobility, reach over head, and locomotion level  PARTICIPATION LIMITATIONS: meal prep, cleaning, driving, shopping, community activity, occupation, and yard work  PERSONAL FACTORS 3+ comorbidities:  hip dislocation RTC tears; scoliosis; DDD of multiple joints; comression fx of the spine   are also affecting patient's functional outcome.   REHAB POTENTIAL: Good  CLINICAL DECISION MAKING: Unstable/unpredictable hip has dislocated since surgery; flutuating pain levels   EVALUATION COMPLEXITY:  High   GOALS: Goals reviewed with patient? Yes  SHORT TERM GOALS: Target date: 9/5 Patient will increase right active shoulder flexion to 100 degree s Baseline: improving flexion bilateral  Goal status: ongoing   2.  Patient will increase gross right shoulder strength by 5 lbs  Baseline:  Goal status: just measured with dyno today   3.  Patient will report a 50% reduction of pain in her shoulders while using her walker  Baseline:  Goal status: still pain ful ongoing     LONG TERM GOALS: Target date:06/01/22  Patent will be independent with an UE shoulder strength and stretching program  Baseline:  Goal status: progressing   2.  Patient will use both arms for mobility and ADL's with a 75% self reported reduction in pain Baseline:  Goal status: still having pain but improved   3.  Patient will lift her walker into and out of the car to improve her ability to perform community activity. Baseline:  Goal Status:  New    PLAN: PT FREQUENCY: 2x/week  PT DURATION: 8 weeks  PLANNED INTERVENTIONS: Therapeutic exercises, Therapeutic activity, Neuromuscular re-education, Balance training, Gait training, Patient/Family education, Self Care, Joint mobilization, Aquatic Therapy, Dry Needling, Electrical stimulation, Cryotherapy, Moist heat, Taping, and Manual therapy  PLAN FOR NEXT  SESSION: in the pool and land work on ROM, scap stability shoulder strengthening; manual therapy to the neck. Add in general mobility when cleared for the hip  Carolyne Littles PT DPT  04/29/22 11:32 AM [place], [place], [address] Phone: [phone]   Fax:  [phone]

## 2022-05-02 NOTE — Therapy (Signed)
OUTPATIENT PHYSICAL THERAPY SHOULDER TREATMENT NOTE   Patient Name: Virginia Gordon MRN: [id number] DOB:[birth year], 71 y.o., female Today's Date: 05/03/2022   PT End of Session - 05/03/22 1234     Visit Number 17    Number of Visits 26    Date for PT Re-Evaluation 06/01/22    Authorization Type Progress note done on the 20th visit    PT Start Time 1117    PT Stop Time 1200    PT Time Calculation (min) 43 min    Activity Tolerance Patient tolerated treatment well    Behavior During Therapy [facility] for tasks assessed/performed                  Past Medical History:  Diagnosis Date   Rheumatoid arthritis ([place])    Past Surgical History:  Procedure Laterality Date   BREAST BIOPSY Left    pt not sure of date.   HIP SURGERY     SHOULDER SURGERY     Patient Active Problem List   Diagnosis Date Noted   Effusion of left knee joint 08/17/2021   ADD (attention deficit disorder) 11/13/2015   Connective tissue disease overlap syndrome ([place]) 11/13/2015   Menopausal and perimenopausal disorder 11/13/2015   Acne erythematosa 11/13/2015   Disordered sleep 11/13/2015   History of operative procedure on hip 07/03/2015   Left knee pain 05/20/2014   Right foot pain 05/14/2014   Bilateral shoulder pain 05/14/2014   Hypothyroidism 03/08/2014   Degenerative arthritis of hip 03/07/2012    PCP: [name]   REFERRING PROVIDER: [name] MD   REFERRING DIAG: R29.898 (ICD-10-CM) - Other symptoms and signs involving the musculoskeletal system  THERAPY DIAG:  Chronic right shoulder pain  Chronic bilateral low back pain without sciatica  Pain in right hip  Pain in left hip  Other abnormalities of gait and mobility  Rationale for Evaluation and Treatment Rehabilitation  ONSET DATE: Hip Revision 01/04/2022 Initial surgery was March 7th    SUBJECTIVE:                                                                                                                                                                                       SUBJECTIVE STATEMENT:  My shoulders have been really hurting last few days.  I am having trouble swimming with the pain"   PERTINENT HISTORY: Charcott foot, Left hip replacement and revision; right hip revision, Scoliosis, DDD, Lumbar compression fracture; Right Shoulder RTC tear( repaired 2x) Rheumatoid arthritis ( although tests were negative; R foot pain;   PAIN:  Are you having pain? yes: NPRS scale: 5/10   Pain location: shoulders  bilat Pain description: aching  Aggravating factors: use of her shoulders and pressure  Relieving factors: rest    PRECAUTIONS: No work on the hip at this time   WEIGHT BEARING RESTRICTIONS Yes WBAT   FALLS:  Has patient fallen in last 6 months? Yes. Number of falls had 1 fall prior to her dislocation Since the surgery no falls  No LIVING ENVIRONMENT: No steps at her house  OCCUPATION: Retried   PLOF: Requires assistive device for independence  PATIENT GOALS   OBJECTIVE:   DIAGNOSTIC FINDINGS:  Has had frequent follow up x-rays. Per patient everything looks good   PATIENT SURVEYS:  FOTO    COGNITION:  Overall cognitive status: Within functional limits for tasks assessed     SENSATION: WFL  POSTURE: Raised shoulders bilateral   UPPER EXTREMITY ROM:   MMT ROM Right eval Left eval Right  Left   Shoulder flexion 4/5 4+/5 4+/5 11.1 4+/5 11.7  Shoulder extension      Shoulder abduction      Shoulder adduction      Shoulder internal rotation 4+/5 4+/5 9.9 13.1  Shoulder external rotation 3+/5 3+/5  9.8 10.3  Elbow flexion      Elbow extension      Wrist flexion      Wrist extension      Wrist ulnar deviation      Wrist radial deviation      Wrist pronation      Wrist supination      (Blank rows = not tested)  UPPER EXTREMITY MMT:  AROM  Right eval Left eval Right  Left   Shoulder flexion 94 140 100 148  Shoulder extension      Shoulder abduction       Shoulder adduction      Shoulder internal rotation Full  Full     Shoulder external rotation Can reach behind her head but needs to put her head forward  Can reach behind her head but needs to put her head forward Improved  Improved   Middle trapezius      Lower trapezius      Elbow flexion      Elbow extension      Wrist flexion      Wrist extension      Wrist ulnar deviation      Wrist radial deviation      Wrist pronation      Wrist supination      Grip strength (lbs)      (Blank rows = not tested)  SHOULDER SPECIAL TESTS:    PALPATION:  Tenderness to palpation in the upper back    TODAY'S TREATMENT:  9/28 Pt seen for aquatic therapy today.  Treatment took place in water 3.25-4.5 ft in depth at the [place]. Temp of water was 91.  Pt entered/exited the pool via stairs independently with single rail.   Straddling yellow noodle: gentle cycling with breast stroke arms forward and backward, arm abdct/ add; shoulder retraction Plank on white barbell 3x 20s hold   Above position with le on surface UE pull down in plank holding white barbell 2x 5;  Plank on white barbell with hip extension x 10; hands on step x10 Sititng balance for core engagement on sm squoodle. Holding x 20 sec hands; then hands out of water x 20s; progressed to pushing buoy under pt in seated position holding  Instruction and completion of reverse plank with assistance.  Pt able to gain position  and hold briefly Instruction and completion of side plank   Pt requires the buoyancy and hydrostatic pressure of water for support, and to offload joints by unweighting joint load by at least 50 % in navel deep water and by at least 75-80% in chest to neck deep water.  Viscosity of the water is needed for resistance of strengthening. Water current perturbations provides challenge to standing balance requiring increased core activation.   9/26 Manual: Trigger point release to upper trap; PROM into ER  and flexion    Wand flexion 2lbs x2x15  Serratus press 3x15 3lbs   Chest press 3x15 lbs  Biceps curl 3x15  3lbs  Row 2x20 green  Shoulder extension 2x20 green  Supine ABC 2lbs          PATIENT EDUCATION: Education details: reviewed HEP; symptom management; importance of following hip precautions and recommendations  Person educated: Patient Education method: Explanation Education comprehension: verbalized understanding, returned demonstration, verbal cues required, tactile cues required, and needs further education   HOME EXERCISE PROGRAM:  [id number]   ASSESSMENT:  CLINICAL IMPRESSION: The patient tolerated treatment well. She reports she was able to walk through the grocery store yesterday. Tolerated ~25-30 mins. 1st time in a long time. Focus on core strength using water pilates techniques. Pt with very good effort.  Unable to gain side plank despite several tries. Core strength improving. Pt report purchasing overhead pulley system as instructed by land based therapist and has been using    OBJECTIVE IMPAIRMENTS Abnormal gait, decreased activity tolerance, decreased balance, decreased mobility, difficulty walking, decreased ROM, decreased strength, increased fascial restrictions, impaired UE functional use, and pain.   ACTIVITY LIMITATIONS carrying, lifting, bending, standing, squatting, stairs, transfers, bed mobility, reach over head, and locomotion level  PARTICIPATION LIMITATIONS: meal prep, cleaning, driving, shopping, community activity, occupation, and yard work  PERSONAL FACTORS 3+ comorbidities:  hip dislocation RTC tears; scoliosis; DDD of multiple joints; comression fx of the spine   are also affecting patient's functional outcome.   REHAB POTENTIAL: Good  CLINICAL DECISION MAKING: Unstable/unpredictable hip has dislocated since surgery; flutuating pain levels   EVALUATION COMPLEXITY: High   GOALS: Goals reviewed with patient? Yes  SHORT TERM  GOALS: Target date: 9/5 Patient will increase right active shoulder flexion to 100 degree s Baseline: improving flexion bilateral  Goal status: ongoing   2.  Patient will increase gross right shoulder strength by 5 lbs  Baseline:  Goal status: just measured with dyno today   3.  Patient will report a 50% reduction of pain in her shoulders while using her walker  Baseline:  Goal status: still pain ful ongoing     LONG TERM GOALS: Target date:06/01/22  Patent will be independent with an UE shoulder strength and stretching program  Baseline:  Goal status: progressing   2.  Patient will use both arms for mobility and ADL's with a 75% self reported reduction in pain Baseline:  Goal status: still having pain but improved   3.  Patient will lift her walker into and out of the car to improve her ability to perform community activity. Baseline:  Goal Status:  New    PLAN: PT FREQUENCY: 2x/week  PT DURATION: 8 weeks  PLANNED INTERVENTIONS: Therapeutic exercises, Therapeutic activity, Neuromuscular re-education, Balance training, Gait training, Patient/Family education, Self Care, Joint mobilization, Aquatic Therapy, Dry Needling, Electrical stimulation, Cryotherapy, Moist heat, Taping, and Manual therapy  PLAN FOR NEXT SESSION: in the pool and land work on ROM, scap stability shoulder  strengthening; manual therapy to the neck. Add in general mobility when cleared for the hip  Annamarie Major) Deidra Spease MPT 05/03/22 12:35 PM [place], [place], [address] Phone: [phone]   Fax:  [phone]

## 2022-05-03 ENCOUNTER — Ambulatory Visit (HOSPITAL_BASED_OUTPATIENT_CLINIC_OR_DEPARTMENT_OTHER): Payer: Medicare Other | Attending: Family Medicine | Admitting: Physical Therapy

## 2022-05-03 ENCOUNTER — Encounter (HOSPITAL_BASED_OUTPATIENT_CLINIC_OR_DEPARTMENT_OTHER): Payer: Self-pay | Admitting: Physical Therapy

## 2022-05-03 DIAGNOSIS — M545 Low back pain, unspecified: Secondary | ICD-10-CM | POA: Diagnosis not present

## 2022-05-03 DIAGNOSIS — G8929 Other chronic pain: Secondary | ICD-10-CM | POA: Diagnosis not present

## 2022-05-03 DIAGNOSIS — M25552 Pain in left hip: Secondary | ICD-10-CM | POA: Diagnosis not present

## 2022-05-03 DIAGNOSIS — M25551 Pain in right hip: Secondary | ICD-10-CM | POA: Insufficient documentation

## 2022-05-03 DIAGNOSIS — R2689 Other abnormalities of gait and mobility: Secondary | ICD-10-CM | POA: Diagnosis not present

## 2022-05-03 DIAGNOSIS — M25511 Pain in right shoulder: Secondary | ICD-10-CM | POA: Insufficient documentation

## 2022-05-05 ENCOUNTER — Ambulatory Visit (HOSPITAL_BASED_OUTPATIENT_CLINIC_OR_DEPARTMENT_OTHER): Payer: Medicare Other | Admitting: Physical Therapy

## 2022-05-05 DIAGNOSIS — M25551 Pain in right hip: Secondary | ICD-10-CM | POA: Diagnosis not present

## 2022-05-05 DIAGNOSIS — G8929 Other chronic pain: Secondary | ICD-10-CM

## 2022-05-05 DIAGNOSIS — M25552 Pain in left hip: Secondary | ICD-10-CM

## 2022-05-05 DIAGNOSIS — R2689 Other abnormalities of gait and mobility: Secondary | ICD-10-CM | POA: Diagnosis not present

## 2022-05-05 DIAGNOSIS — M25511 Pain in right shoulder: Secondary | ICD-10-CM | POA: Diagnosis not present

## 2022-05-05 DIAGNOSIS — M545 Low back pain, unspecified: Secondary | ICD-10-CM | POA: Diagnosis not present

## 2022-05-05 NOTE — Therapy (Signed)
OUTPATIENT PHYSICAL THERAPY SHOULDER TREATMENT NOTE   Patient Name: Virginia Gordon MRN: [id number] DOB:1950/12/09, 71 y.o., female Today's Date: 05/06/2022   PT End of Session - 05/06/22 1411     Visit Number 18    Number of Visits 26    Date for PT Re-Evaluation 06/01/22    Authorization Type Progress note done on the 20th visit    PT Start Time 1515    PT Stop Time 1557    PT Time Calculation (min) 42 min    Activity Tolerance Patient tolerated treatment well    Behavior During Therapy [facility] for tasks assessed/performed                   Past Medical History:  Diagnosis Date   Rheumatoid arthritis ([place])    Past Surgical History:  Procedure Laterality Date   BREAST BIOPSY Left    pt not sure of date.   HIP SURGERY     SHOULDER SURGERY     Patient Active Problem List   Diagnosis Date Noted   Effusion of left knee joint 08/17/2021   ADD (attention deficit disorder) 11/13/2015   Connective tissue disease overlap syndrome ([place]) 11/13/2015   Menopausal and perimenopausal disorder 11/13/2015   Acne erythematosa 11/13/2015   Disordered sleep 11/13/2015   History of operative procedure on hip 07/03/2015   Left knee pain 05/20/2014   Right foot pain 05/14/2014   Bilateral shoulder pain 05/14/2014   Hypothyroidism 03/08/2014   Degenerative arthritis of hip 03/07/2012    PCP: [name]   REFERRING PROVIDER: [name] MD   REFERRING DIAG: R29.898 (ICD-10-CM) - Other symptoms and signs involving the musculoskeletal system  THERAPY DIAG:  Chronic right shoulder pain  Chronic bilateral low back pain without sciatica  Pain in right hip  Pain in left hip  Rationale for Evaluation and Treatment Rehabilitation  ONSET DATE: Hip Revision 01/04/2022 Initial surgery was March 7th    SUBJECTIVE:                                                                                                                                                                                       SUBJECTIVE STATEMENT: Patient reports her shoulders have been feeling better. She is intrested in trying the bike today. She reports the MD cleared her for the exercise bike.    PERTINENT HISTORY: Charcott foot, Left hip replacement and revision; right hip revision, Scoliosis, DDD, Lumbar compression fracture; Right Shoulder RTC tear( repaired 2x) Rheumatoid arthritis ( although tests were negative; R foot pain;   PAIN:  Are you having pain? yes: NPRS scale: 5/10   Pain  location: shoulders bilat Pain description: aching  Aggravating factors: use of her shoulders and pressure  Relieving factors: rest    PRECAUTIONS: No work on the hip at this time   WEIGHT BEARING RESTRICTIONS Yes WBAT   FALLS:  Has patient fallen in last 6 months? Yes. Number of falls had 1 fall prior to her dislocation Since the surgery no falls  No LIVING ENVIRONMENT: No steps at her house  OCCUPATION: Retried   PLOF: Requires assistive device for independence  PATIENT GOALS   OBJECTIVE:   DIAGNOSTIC FINDINGS:  Has had frequent follow up x-rays. Per patient everything looks good   PATIENT SURVEYS:  FOTO    COGNITION:  Overall cognitive status: Within functional limits for tasks assessed     SENSATION: WFL  POSTURE: Raised shoulders bilateral   UPPER EXTREMITY ROM:   MMT ROM Right eval Left eval Right  Left   Shoulder flexion 4/5 4+/5 4+/5 11.1 4+/5 11.7  Shoulder extension      Shoulder abduction      Shoulder adduction      Shoulder internal rotation 4+/5 4+/5 9.9 13.1  Shoulder external rotation 3+/5 3+/5  9.8 10.3  Elbow flexion      Elbow extension      Wrist flexion      Wrist extension      Wrist ulnar deviation      Wrist radial deviation      Wrist pronation      Wrist supination      (Blank rows = not tested)  UPPER EXTREMITY MMT:  AROM  Right eval Left eval Right  Left   Shoulder flexion 94 140 100 148  Shoulder extension      Shoulder  abduction      Shoulder adduction      Shoulder internal rotation Full  Full     Shoulder external rotation Can reach behind her head but needs to put her head forward  Can reach behind her head but needs to put her head forward Improved  Improved   Middle trapezius      Lower trapezius      Elbow flexion      Elbow extension      Wrist flexion      Wrist extension      Wrist ulnar deviation      Wrist radial deviation      Wrist pronation      Wrist supination      Grip strength (lbs)      (Blank rows = not tested)  SHOULDER SPECIAL TESTS:    PALPATION:  Tenderness to palpation in the upper back    TODAY'S TREATMENT:  10/5 Reviewed proper set up for th eexercise bike to protect her hip. Overall the bike will hel improve her overall endurance and take stress off the shoulders which is why she is here.   Manual: Trigger point release to upper trap; PROM into ER and flexion    Wand flexion 2lbs x2x15  Serratus press 3x15 3lbs   Chest press 3x15 lbs  Biceps curl 3x15  3lbs  Row 2x20 green  Shoulder extension 2x20 green  Supine ABC 2lbs    9/28 Pt seen for aquatic therapy today.  Treatment took place in water 3.25-4.5 ft in depth at the [place]. Temp of water was 91.  Pt entered/exited the pool via stairs independently with single rail.   Straddling yellow noodle: gentle cycling with breast stroke arms forward and  backward, arm abdct/ add; shoulder retraction Plank on white barbell 3x 20s hold   Above position with le on surface UE pull down in plank holding white barbell 2x 5;  Plank on white barbell with hip extension x 10; hands on step x10 Sititng balance for core engagement on sm squoodle. Holding x 20 sec hands; then hands out of water x 20s; progressed to pushing buoy under pt in seated position holding  Instruction and completion of reverse plank with assistance.  Pt able to gain position and hold briefly Instruction and completion of side  plank   Pt requires the buoyancy and hydrostatic pressure of water for support, and to offload joints by unweighting joint load by at least 50 % in navel deep water and by at least 75-80% in chest to neck deep water.  Viscosity of the water is needed for resistance of strengthening. Water current perturbations provides challenge to standing balance requiring increased core activation.   9/26 Manual: Trigger point release to upper trap; PROM into ER and flexion    Wand flexion 2lbs x2x15  Serratus press 3x15 3lbs   Chest press 3x15 lbs  Biceps curl 3x15  3lbs  Row 2x20 green  Shoulder extension 2x20 green  Supine ABC 2lbs          PATIENT EDUCATION: Education details: reviewed HEP; symptom management; importance of following hip precautions and recommendations  Person educated: Patient Education method: Explanation Education comprehension: verbalized understanding, returned demonstration, verbal cues required, tactile cues required, and needs further education   HOME EXERCISE PROGRAM:  [id number]   ASSESSMENT:  CLINICAL IMPRESSION: The patient tolerated the bike very well. She had no significant increase in pain with use of the exercise bike. She continues to do well with given there-ex. There has not been a huge improvement in pain in her shoulders yet but her function is improving. We continue to wokr on building scpalar strength and shoulder stability. No increase in pain with treatment.    OBJECTIVE IMPAIRMENTS Abnormal gait, decreased activity tolerance, decreased balance, decreased mobility, difficulty walking, decreased ROM, decreased strength, increased fascial restrictions, impaired UE functional use, and pain.   ACTIVITY LIMITATIONS carrying, lifting, bending, standing, squatting, stairs, transfers, bed mobility, reach over head, and locomotion level  PARTICIPATION LIMITATIONS: meal prep, cleaning, driving, shopping, community activity, occupation, and yard  work  PERSONAL FACTORS 3+ comorbidities:  hip dislocation RTC tears; scoliosis; DDD of multiple joints; comression fx of the spine   are also affecting patient's functional outcome.   REHAB POTENTIAL: Good  CLINICAL DECISION MAKING: Unstable/unpredictable hip has dislocated since surgery; flutuating pain levels   EVALUATION COMPLEXITY: High   GOALS: Goals reviewed with patient? Yes  SHORT TERM GOALS: Target date: 9/5 Patient will increase right active shoulder flexion to 100 degree s Baseline: improving flexion bilateral  Goal status: ongoing   2.  Patient will increase gross right shoulder strength by 5 lbs  Baseline:  Goal status: just measured with dyno today   3.  Patient will report a 50% reduction of pain in her shoulders while using her walker  Baseline:  Goal status: still pain ful ongoing     LONG TERM GOALS: Target date:06/01/22  Patent will be independent with an UE shoulder strength and stretching program  Baseline:  Goal status: progressing   2.  Patient will use both arms for mobility and ADL's with a 75% self reported reduction in pain Baseline:  Goal status: still having pain but improved   3.  Patient will lift her walker into and out of the car to improve her ability to perform community activity. Baseline:  Goal Status:  New    PLAN: PT FREQUENCY: 2x/week  PT DURATION: 8 weeks  PLANNED INTERVENTIONS: Therapeutic exercises, Therapeutic activity, Neuromuscular re-education, Balance training, Gait training, Patient/Family education, Self Care, Joint mobilization, Aquatic Therapy, Dry Needling, Electrical stimulation, Cryotherapy, Moist heat, Taping, and Manual therapy  PLAN FOR NEXT SESSION: in the pool and land work on ROM, scap stability shoulder strengthening; manual therapy to the neck. Add in general mobility when cleared for the hip  Stanton Kidney Tharon Aquas) [place] MPT 05/06/22 2:15 PM [facility] [address] [place], [place], [address] Phone: [phone]   Fax:  [phone]

## 2022-05-06 ENCOUNTER — Encounter (HOSPITAL_BASED_OUTPATIENT_CLINIC_OR_DEPARTMENT_OTHER): Payer: Self-pay | Admitting: Physical Therapy

## 2022-05-06 DIAGNOSIS — Z23 Encounter for immunization: Secondary | ICD-10-CM | POA: Diagnosis not present

## 2022-05-07 ENCOUNTER — Ambulatory Visit (HOSPITAL_BASED_OUTPATIENT_CLINIC_OR_DEPARTMENT_OTHER): Payer: Medicare Other | Admitting: Physical Therapy

## 2022-05-09 NOTE — Therapy (Signed)
OUTPATIENT PHYSICAL THERAPY SHOULDER TREATMENT NOTE   Patient Name: Virginia Gordon MRN: [id number] DOB:[birth year], 71 y.o., female Today's Date: 05/10/2022   PT End of Session - 05/10/22 1039     Visit Number 19    Number of Visits 26    Date for PT Re-Evaluation 06/01/22    Authorization Type Progress note done on the 20th visit    PT Start Time 1033    PT Stop Time 1115    PT Time Calculation (min) 42 min    Activity Tolerance Patient tolerated treatment well    Behavior During Therapy [facility] for tasks assessed/performed                    Past Medical History:  Diagnosis Date   Rheumatoid arthritis ([place])    Past Surgical History:  Procedure Laterality Date   BREAST BIOPSY Left    pt not sure of date.   HIP SURGERY     SHOULDER SURGERY     Patient Active Problem List   Diagnosis Date Noted   Effusion of left knee joint 08/17/2021   ADD (attention deficit disorder) 11/13/2015   Connective tissue disease overlap syndrome ([place]) 11/13/2015   Menopausal and perimenopausal disorder 11/13/2015   Acne erythematosa 11/13/2015   Disordered sleep 11/13/2015   History of operative procedure on hip 07/03/2015   Left knee pain 05/20/2014   Right foot pain 05/14/2014   Bilateral shoulder pain 05/14/2014   Hypothyroidism 03/08/2014   Degenerative arthritis of hip 03/07/2012    PCP: [name]   REFERRING PROVIDER: [name] MD   REFERRING DIAG: R29.898 (ICD-10-CM) - Other symptoms and signs involving the musculoskeletal system  THERAPY DIAG:  Chronic right shoulder pain  Chronic bilateral low back pain without sciatica  Pain in right hip  Pain in left hip  Rationale for Evaluation and Treatment Rehabilitation  ONSET DATE: Hip Revision 01/04/2022 Initial surgery was March 7th    SUBJECTIVE:                                                                                                                                                                                       SUBJECTIVE STATEMENT: Patient reports  riding bike and swimming between last session and this one.  No pain. Pt encouraged with progress.  PERTINENT HISTORY: Charcott foot, Left hip replacement and revision; right hip revision, Scoliosis, DDD, Lumbar compression fracture; Right Shoulder RTC tear( repaired 2x) Rheumatoid arthritis ( although tests were negative; R foot pain;   PAIN:  Are you having pain? yes: NPRS scale: 6/10   Pain location: shoulders bilat Pain description: aching  Aggravating factors: use of her shoulders and pressure  Relieving factors: rest    PRECAUTIONS: No work on the hip at this time   WEIGHT BEARING RESTRICTIONS Yes WBAT   FALLS:  Has patient fallen in last 6 months? Yes. Number of falls had 1 fall prior to her dislocation Since the surgery no falls  No LIVING ENVIRONMENT: No steps at her house  OCCUPATION: Retried   PLOF: Requires assistive device for independence  PATIENT GOALS   OBJECTIVE:   DIAGNOSTIC FINDINGS:  Has had frequent follow up x-rays. Per patient everything looks good   PATIENT SURVEYS:  FOTO    COGNITION:  Overall cognitive status: Within functional limits for tasks assessed     SENSATION: WFL  POSTURE: Raised shoulders bilateral   UPPER EXTREMITY ROM:   MMT ROM Right eval Left eval Right  Left   Shoulder flexion 4/5 4+/5 4+/5 11.1 4+/5 11.7  Shoulder extension      Shoulder abduction      Shoulder adduction      Shoulder internal rotation 4+/5 4+/5 9.9 13.1  Shoulder external rotation 3+/5 3+/5  9.8 10.3  Elbow flexion      Elbow extension      Wrist flexion      Wrist extension      Wrist ulnar deviation      Wrist radial deviation      Wrist pronation      Wrist supination      (Blank rows = not tested)  UPPER EXTREMITY MMT:  AROM  Right eval Left eval Right  Left   Shoulder flexion 94 140 100 148  Shoulder extension      Shoulder abduction      Shoulder adduction       Shoulder internal rotation Full  Full     Shoulder external rotation Can reach behind her head but needs to put her head forward  Can reach behind her head but needs to put her head forward Improved  Improved   Middle trapezius      Lower trapezius      Elbow flexion      Elbow extension      Wrist flexion      Wrist extension      Wrist ulnar deviation      Wrist radial deviation      Wrist pronation      Wrist supination      Grip strength (lbs)      (Blank rows = not tested)  SHOULDER SPECIAL TESTS:    PALPATION:  Tenderness to palpation in the upper back    TODAY'S TREATMENT:  10/5 Reviewed proper set up for th eexercise bike to protect her hip. Overall the bike will hel improve her overall endurance and take stress off the shoulders which is why she is here.   Manual: Trigger point release to upper trap; PROM into ER and flexion    Wand flexion 2lbs x2x15  Serratus press 3x15 3lbs   Chest press 3x15 lbs  Biceps curl 3x15  3lbs  Row 2x20 green  Shoulder extension 2x20 green  Supine ABC 2lbs    9/28 Pt seen for aquatic therapy today.  Treatment took place in water 3.25-4.5 ft in depth at the [place]. Temp of water was 91.  Pt entered/exited the pool via stairs independently with single rail.   Straddling yellow noodle: gentle cycling with breast stroke arms forward and backward, arm abdct/ add; shoulder retraction Continued  instruction on side plank, initially on step able to hold R/L x 20s; added leg lifts x 10 After several tries with different floatation devices pt able to gain side plank using white barbell x 3s toward right Plank on white barbell 2x 20s hold   Above position with arm pull downs 2x5 Above position with le on surface 2x20s hold Plank on white barbell with hip extension x 10; hands on step x10 Sititng balance for core engagement on sm squoodle. Holding x 20 sec hands; then hands out of water x 20s; progressed to pushing buoy  under pt in seated position holding  Instruction and completion of reverse plank with assistance.  Pt able to gain position and hold briefly    Pt requires the buoyancy and hydrostatic pressure of water for support, and to offload joints by unweighting joint load by at least 50 % in navel deep water and by at least 75-80% in chest to neck deep water.  Viscosity of the water is needed for resistance of strengthening. Water current perturbations provides challenge to standing balance requiring increased core activation.   9/26 Manual: Trigger point release to upper trap; PROM into ER and flexion    Wand flexion 2lbs x2x15  Serratus press 3x15 3lbs   Chest press 3x15 lbs  Biceps curl 3x15  3lbs  Row 2x20 green  Shoulder extension 2x20 green  Supine ABC 2lbs          PATIENT EDUCATION: Education details: reviewed HEP; symptom management; importance of following hip precautions and recommendations  Person educated: Patient Education method: Explanation Education comprehension: verbalized understanding, returned demonstration, verbal cues required, tactile cues required, and needs further education   HOME EXERCISE PROGRAM:  [id number]   ASSESSMENT:  CLINICAL IMPRESSION: Pt tolerates progression of core strengthening using water Pilates techniques well. Some cueing for avoiding frustration and improved mindfulness with execution.  Improved core control with side plank. Pt without increase discomfort.  Goals ongoing.    OBJECTIVE IMPAIRMENTS Abnormal gait, decreased activity tolerance, decreased balance, decreased mobility, difficulty walking, decreased ROM, decreased strength, increased fascial restrictions, impaired UE functional use, and pain.   ACTIVITY LIMITATIONS carrying, lifting, bending, standing, squatting, stairs, transfers, bed mobility, reach over head, and locomotion level  PARTICIPATION LIMITATIONS: meal prep, cleaning, driving, shopping, community activity,  occupation, and yard work  PERSONAL FACTORS 3+ comorbidities:  hip dislocation RTC tears; scoliosis; DDD of multiple joints; comression fx of the spine   are also affecting patient's functional outcome.   REHAB POTENTIAL: Good  CLINICAL DECISION MAKING: Unstable/unpredictable hip has dislocated since surgery; flutuating pain levels   EVALUATION COMPLEXITY: High   GOALS: Goals reviewed with patient? Yes  SHORT TERM GOALS: Target date: 9/5 Patient will increase right active shoulder flexion to 100 degree s Baseline: improving flexion bilateral  Goal status: ongoing   2.  Patient will increase gross right shoulder strength by 5 lbs  Baseline:  Goal status: just measured with dyno today   3.  Patient will report a 50% reduction of pain in her shoulders while using her walker  Baseline:  Goal status: still pain ful ongoing     LONG TERM GOALS: Target date:06/01/22  Patent will be independent with an UE shoulder strength and stretching program  Baseline:  Goal status: progressing   2.  Patient will use both arms for mobility and ADL's with a 75% self reported reduction in pain Baseline:  Goal status: still having pain but improved   3.  Patient will  lift her walker into and out of the car to improve her ability to perform community activity. Baseline:  Goal Status:  New    PLAN: PT FREQUENCY: 2x/week  PT DURATION: 8 weeks  PLANNED INTERVENTIONS: Therapeutic exercises, Therapeutic activity, Neuromuscular re-education, Balance training, Gait training, Patient/Family education, Self Care, Joint mobilization, Aquatic Therapy, Dry Needling, Electrical stimulation, Cryotherapy, Moist heat, Taping, and Manual therapy  PLAN FOR NEXT SESSION: in the pool and land work on ROM, scap stability shoulder strengthening; manual therapy to the neck. Add in general mobility when cleared for the hip  Annamarie Major) Jorene Kaylor MPT 05/10/22 10:41 AM [place], [place], [address] Phone: [phone]   Fax:  [phone]

## 2022-05-10 ENCOUNTER — Ambulatory Visit (HOSPITAL_BASED_OUTPATIENT_CLINIC_OR_DEPARTMENT_OTHER): Payer: Medicare Other | Admitting: Physical Therapy

## 2022-05-10 ENCOUNTER — Encounter (HOSPITAL_BASED_OUTPATIENT_CLINIC_OR_DEPARTMENT_OTHER): Payer: Self-pay | Admitting: Physical Therapy

## 2022-05-10 DIAGNOSIS — M25551 Pain in right hip: Secondary | ICD-10-CM | POA: Diagnosis not present

## 2022-05-10 DIAGNOSIS — M25511 Pain in right shoulder: Secondary | ICD-10-CM | POA: Diagnosis not present

## 2022-05-10 DIAGNOSIS — G8929 Other chronic pain: Secondary | ICD-10-CM | POA: Diagnosis not present

## 2022-05-10 DIAGNOSIS — M25552 Pain in left hip: Secondary | ICD-10-CM

## 2022-05-10 DIAGNOSIS — M545 Low back pain, unspecified: Secondary | ICD-10-CM

## 2022-05-10 DIAGNOSIS — R2689 Other abnormalities of gait and mobility: Secondary | ICD-10-CM | POA: Diagnosis not present

## 2022-05-14 ENCOUNTER — Ambulatory Visit (HOSPITAL_BASED_OUTPATIENT_CLINIC_OR_DEPARTMENT_OTHER): Payer: Medicare Other | Admitting: Physical Therapy

## 2022-05-14 ENCOUNTER — Encounter (HOSPITAL_BASED_OUTPATIENT_CLINIC_OR_DEPARTMENT_OTHER): Payer: Self-pay | Admitting: Physical Therapy

## 2022-05-14 DIAGNOSIS — R2689 Other abnormalities of gait and mobility: Secondary | ICD-10-CM | POA: Diagnosis not present

## 2022-05-14 DIAGNOSIS — M25511 Pain in right shoulder: Secondary | ICD-10-CM | POA: Diagnosis not present

## 2022-05-14 DIAGNOSIS — M25551 Pain in right hip: Secondary | ICD-10-CM | POA: Diagnosis not present

## 2022-05-14 DIAGNOSIS — M25552 Pain in left hip: Secondary | ICD-10-CM

## 2022-05-14 DIAGNOSIS — M545 Low back pain, unspecified: Secondary | ICD-10-CM | POA: Diagnosis not present

## 2022-05-14 DIAGNOSIS — G8929 Other chronic pain: Secondary | ICD-10-CM | POA: Diagnosis not present

## 2022-05-14 NOTE — Therapy (Signed)
OUTPATIENT PHYSICAL THERAPY SHOULDER TREATMENT NOTE/progress   Patient Name: Virginia Gordon MRN: [id number] DOB:01-01-1951, 71 y.o., female Today's Date: 05/14/2022   PT End of Session - 05/14/22 1350     Visit Number 20    Number of Visits 26    Date for PT Re-Evaluation 06/25/22    Authorization Type Progress note done on the 20th visit    PT Start Time 1347    PT Stop Time 1428    PT Time Calculation (min) 41 min    Activity Tolerance Patient tolerated treatment well    Behavior During Therapy [facility] for tasks assessed/performed             Progress Note Reporting Period 04/06/2022 to 05/14/2022  See note below for Objective Data and Assessment of Progress/Goals.           Past Medical History:  Diagnosis Date   Rheumatoid arthritis ([place])    Past Surgical History:  Procedure Laterality Date   BREAST BIOPSY Left    pt not sure of date.   HIP SURGERY     SHOULDER SURGERY     Patient Active Problem List   Diagnosis Date Noted   Effusion of left knee joint 08/17/2021   ADD (attention deficit disorder) 11/13/2015   Connective tissue disease overlap syndrome ([place]) 11/13/2015   Menopausal and perimenopausal disorder 11/13/2015   Acne erythematosa 11/13/2015   Disordered sleep 11/13/2015   History of operative procedure on hip 07/03/2015   Left knee pain 05/20/2014   Right foot pain 05/14/2014   Bilateral shoulder pain 05/14/2014   Hypothyroidism 03/08/2014   Degenerative arthritis of hip 03/07/2012    PCP: [name]   REFERRING PROVIDER: [name] MD   REFERRING DIAG: R29.898 (ICD-10-CM) - Other symptoms and signs involving the musculoskeletal system  THERAPY DIAG:  Chronic right shoulder pain  Chronic bilateral low back pain without sciatica  Pain in right hip  Pain in left hip  Other abnormalities of gait and mobility  Rationale for Evaluation and Treatment Rehabilitation  ONSET DATE: Hip Revision 01/04/2022 Initial surgery was  March 7th    SUBJECTIVE:                                                                                                                                                                                      SUBJECTIVE STATEMENT: Patient reports riding bike and swimming between last session and this one.  She swam a mile. She had some pain in her shoulder.   PERTINENT HISTORY: Charcott foot, Left hip replacement and revision; right hip revision, Scoliosis, DDD, Lumbar compression fracture; Right Shoulder RTC tear( repaired  2x) Rheumatoid arthritis ( although tests were negative; R foot pain;   PAIN:  Are you having pain? yes: NPRS scale: 6/10   Pain location: shoulders bilat Pain description: aching  Aggravating factors: use of her shoulders and pressure  Relieving factors: rest    PRECAUTIONS: No work on the hip at this time   WEIGHT BEARING RESTRICTIONS Yes WBAT   FALLS:  Has patient fallen in last 6 months? Yes. Number of falls had 1 fall prior to her dislocation Since the surgery no falls  No LIVING ENVIRONMENT: No steps at her house  OCCUPATION: Retried   PLOF: Requires assistive device for independence  PATIENT GOALS   OBJECTIVE:   DIAGNOSTIC FINDINGS:  Has had frequent follow up x-rays. Per patient everything looks good   PATIENT SURVEYS:  FOTO    COGNITION:  Overall cognitive status: Within functional limits for tasks assessed     SENSATION: WFL  POSTURE: Raised shoulders bilateral   UPPER EXTREMITY ROM:   MMT ROM Right eval Left eval Right  10/14 Left  10/14  Shoulder flexion 4/5 4+/5 4+/5 11.1 4+/5 11.7  Shoulder extension      Shoulder abduction      Shoulder adduction      Shoulder internal rotation 4+/5 4+/5 9.9 13.1  Shoulder external rotation 3+/5 3+/5  9.8 10.3  Elbow flexion      Elbow extension      Wrist flexion      Wrist extension      Wrist ulnar deviation      Wrist radial deviation      Wrist pronation      Wrist  supination      (Blank rows = not tested)  UPPER EXTREMITY MMT:  AROM  Right eval Left eval Right  Left   Shoulder flexion 94 140 105 148  Shoulder extension      Shoulder abduction      Shoulder adduction      Shoulder internal rotation Full  Full     Shoulder external rotation Can reach behind her head but needs to put her head forward  Can reach behind her head but needs to put her head forward Improved  Improved   Middle trapezius      Lower trapezius      Elbow flexion      Elbow extension      Wrist flexion      Wrist extension      Wrist ulnar deviation      Wrist radial deviation      Wrist pronation      Wrist supination      Grip strength (lbs)      (Blank rows = not tested)  SHOULDER SPECIAL TESTS:    PALPATION:  Tenderness to palpation in the upper back    TODAY'S TREATMENT:  10/14 Manual: Trigger point release to upper trap; PROM into ER and flexion more focusleft today    Wand flexion 2lbs x2x15  Serratus press 3x15 3lbs   Chest press 3x15 lbs  Biceps curl 3x15  3lbs  Row 2x20 green  Shoulder extension 2x20 green  Supine ABC 2lbs        PATIENT EDUCATION: Education details: reviewed HEP; symptom management; importance of following hip precautions and recommendations  Person educated: Patient Education method: Explanation Education comprehension: verbalized understanding, returned demonstration, verbal cues required, tactile cues required, and needs further education   HOME EXERCISE PROGRAM:  [id number]   ASSESSMENT:  CLINICAL IMPRESSION:  The patient continues to make great progress. She swam a mile with no right shoulder pain. She did have some left shoulder pain> We focused more on her left shoulder. She will continue to work for he remaining visits to finalize her HEP and work on reducing left shoulder pain. She was advised that regarding her hip swimming, walking in the pool and doing the bike as advised by her MD is likely her best  bet for long term strengthening.   OBJECTIVE IMPAIRMENTS Abnormal gait, decreased activity tolerance, decreased balance, decreased mobility, difficulty walking, decreased ROM, decreased strength, increased fascial restrictions, impaired UE functional use, and pain.   ACTIVITY LIMITATIONS carrying, lifting, bending, standing, squatting, stairs, transfers, bed mobility, reach over head, and locomotion level  PARTICIPATION LIMITATIONS: meal prep, cleaning, driving, shopping, community activity, occupation, and yard work  PERSONAL FACTORS 3+ comorbidities:  hip dislocation RTC tears; scoliosis; DDD of multiple joints; comression fx of the spine   are also affecting patient's functional outcome.   REHAB POTENTIAL: Good  CLINICAL DECISION MAKING: Unstable/unpredictable hip has dislocated since surgery; flutuating pain levels   EVALUATION COMPLEXITY: High   GOALS: Goals reviewed with patient? Yes  SHORT TERM GOALS: Target date: 9/5 Patient will increase right active shoulder flexion to 100 degree s Baseline: improving flexion bilateral  Goal status: ongoing   2.  Patient will increase gross right shoulder strength by 5 lbs  Baseline:  Goal status: just measured with dyno today   3.  Patient will report a 50% reduction of pain in her shoulders while using her walker  Baseline:  Goal status: still pain ful ongoing     LONG TERM GOALS: Target date:06/01/22  Patent will be independent with an UE shoulder strength and stretching program  Baseline:  Goal status: progressing   2.  Patient will use both arms for mobility and ADL's with a 75% self reported reduction in pain Baseline:  Goal status: still having pain but improved   3.  Patient will lift her walker into and out of the car to improve her ability to perform community activity. Baseline:  Goal Status:  New    PLAN: PT FREQUENCY: 2x/week  PT DURATION: 8 weeks  PLANNED INTERVENTIONS: Therapeutic exercises, Therapeutic  activity, Neuromuscular re-education, Balance training, Gait training, Patient/Family education, Self Care, Joint mobilization, Aquatic Therapy, Dry Needling, Electrical stimulation, Cryotherapy, Moist heat, Taping, and Manual therapy  PLAN FOR NEXT SESSION: in the pool and land work on ROM, scap stability shoulder strengthening; manual therapy to the neck. Add in general mobility when cleared for the hip  Carolyne Littles PT DPT 05/14/22 2:14 PM [place], [place], [address] Phone: [phone]   Fax:  [phone]

## 2022-05-17 ENCOUNTER — Ambulatory Visit (HOSPITAL_BASED_OUTPATIENT_CLINIC_OR_DEPARTMENT_OTHER): Payer: Medicare Other | Admitting: Physical Therapy

## 2022-05-17 ENCOUNTER — Encounter (HOSPITAL_BASED_OUTPATIENT_CLINIC_OR_DEPARTMENT_OTHER): Payer: Self-pay | Admitting: Physical Therapy

## 2022-05-17 DIAGNOSIS — M25552 Pain in left hip: Secondary | ICD-10-CM

## 2022-05-17 DIAGNOSIS — G8929 Other chronic pain: Secondary | ICD-10-CM

## 2022-05-17 DIAGNOSIS — M25511 Pain in right shoulder: Secondary | ICD-10-CM | POA: Diagnosis not present

## 2022-05-17 DIAGNOSIS — M545 Low back pain, unspecified: Secondary | ICD-10-CM | POA: Diagnosis not present

## 2022-05-17 DIAGNOSIS — R2689 Other abnormalities of gait and mobility: Secondary | ICD-10-CM | POA: Diagnosis not present

## 2022-05-17 DIAGNOSIS — M25551 Pain in right hip: Secondary | ICD-10-CM

## 2022-05-17 NOTE — Therapy (Signed)
OUTPATIENT PHYSICAL THERAPY SHOULDER TREATMENT NOTE/progress   Patient Name: Virginia Gordon MRN: [id number] DOB:06/17/1951, 71 y.o., female Today's Date: 05/18/2022   PT End of Session - 05/17/22 1354     Visit Number 21    Number of Visits 32    Date for PT Re-Evaluation 06/25/22    Authorization Type Progress note done on the 20th visit    PT Start Time 1323    PT Stop Time 1401    PT Time Calculation (min) 38 min    Activity Tolerance Patient tolerated treatment well    Behavior During Therapy [facility] for tasks assessed/performed             Progress Note Reporting Period 04/06/2022 to 05/14/2022  See note below for Objective Data and Assessment of Progress/Goals.           Past Medical History:  Diagnosis Date   Rheumatoid arthritis ([place])    Past Surgical History:  Procedure Laterality Date   BREAST BIOPSY Left    pt not sure of date.   HIP SURGERY     SHOULDER SURGERY     Patient Active Problem List   Diagnosis Date Noted   Effusion of left knee joint 08/17/2021   ADD (attention deficit disorder) 11/13/2015   Connective tissue disease overlap syndrome ([place]) 11/13/2015   Menopausal and perimenopausal disorder 11/13/2015   Acne erythematosa 11/13/2015   Disordered sleep 11/13/2015   History of operative procedure on hip 07/03/2015   Left knee pain 05/20/2014   Right foot pain 05/14/2014   Bilateral shoulder pain 05/14/2014   Hypothyroidism 03/08/2014   Degenerative arthritis of hip 03/07/2012    PCP: [name]   REFERRING PROVIDER: [name] MD   REFERRING DIAG: R29.898 (ICD-10-CM) - Other symptoms and signs involving the musculoskeletal system  THERAPY DIAG:  Chronic right shoulder pain  Chronic bilateral low back pain without sciatica  Pain in right hip  Pain in left hip  Other abnormalities of gait and mobility  Rationale for Evaluation and Treatment Rehabilitation  ONSET DATE: Hip Revision 01/04/2022 Initial surgery was  March 7th    SUBJECTIVE:                                                                                                                                                                                      SUBJECTIVE STATEMENT: Patient reports she over did it swimming this week. Both of her shoulders are sore. She is not sure what she can do today.   PERTINENT HISTORY: Charcott foot, Left hip replacement and revision; right hip revision, Scoliosis, DDD, Lumbar compression fracture; Right Shoulder RTC tear( repaired  2x) Rheumatoid arthritis ( although tests were negative; R foot pain;   PAIN:  Are you having pain? yes: NPRS scale: 6/10   Pain location: shoulders bilat Pain description: aching  Aggravating factors: use of her shoulders and pressure  Relieving factors: rest    PRECAUTIONS: No work on the hip at this time   WEIGHT BEARING RESTRICTIONS Yes WBAT   FALLS:  Has patient fallen in last 6 months? Yes. Number of falls had 1 fall prior to her dislocation Since the surgery no falls  No LIVING ENVIRONMENT: No steps at her house  OCCUPATION: Retried   PLOF: Requires assistive device for independence  PATIENT GOALS   OBJECTIVE:   DIAGNOSTIC FINDINGS:  Has had frequent follow up x-rays. Per patient everything looks good   PATIENT SURVEYS:  FOTO    COGNITION:  Overall cognitive status: Within functional limits for tasks assessed     SENSATION: WFL  POSTURE: Raised shoulders bilateral   UPPER EXTREMITY ROM:   MMT ROM Right eval Left eval Right  10/14 Left  10/14  Shoulder flexion 4/5 4+/5 4+/5 11.1 4+/5 11.7  Shoulder extension      Shoulder abduction      Shoulder adduction      Shoulder internal rotation 4+/5 4+/5 9.9 13.1  Shoulder external rotation 3+/5 3+/5  9.8 10.3  Elbow flexion      Elbow extension      Wrist flexion      Wrist extension      Wrist ulnar deviation      Wrist radial deviation      Wrist pronation      Wrist supination       (Blank rows = not tested)  UPPER EXTREMITY MMT:  AROM  Right eval Left eval Right  Left   Shoulder flexion 94 140 105 148  Shoulder extension      Shoulder abduction      Shoulder adduction      Shoulder internal rotation Full  Full     Shoulder external rotation Can reach behind her head but needs to put her head forward  Can reach behind her head but needs to put her head forward Improved  Improved   Middle trapezius      Lower trapezius      Elbow flexion      Elbow extension      Wrist flexion      Wrist extension      Wrist ulnar deviation      Wrist radial deviation      Wrist pronation      Wrist supination      Grip strength (lbs)      (Blank rows = not tested)  SHOULDER SPECIAL TESTS:    PALPATION:  Tenderness to palpation in the upper back    TODAY'S TREATMENT:  10/16 Manual: Trigger point release to upper trap; PROM into ER and flexion both arms today    Wand flexion 2lbs x2x15   pulley 2 min flexion  Scaption 2x min  10/14 Manual: Trigger point release to upper trap; PROM into ER and flexion more focusleft today    Wand flexion 2lbs x2x15  Serratus press 3x15 3lbs   Chest press 3x15 lbs  Biceps curl 3x15  3lbs  Row 2x20 green  Shoulder extension 2x20 green  Supine ABC 2lbs        PATIENT EDUCATION: Education details: reviewed HEP; symptom management; importance of following hip precautions and recommendations  Person  educated: Patient Education method: Explanation Education comprehension: verbalized understanding, returned demonstration, verbal cues required, tactile cues required, and needs further education   HOME EXERCISE PROGRAM:  [id number]   ASSESSMENT:  CLINICAL IMPRESSION: The patient had increased spasming in both shoulders today. Therapy focused on manual therapy and light AAROM to reduce pain. She reported improved pain after treatment. She was advised that some soreness after exercises is normal but it should last  24-48 hours. We will continue to advance her towards a home program for her shoulders.   OBJECTIVE IMPAIRMENTS Abnormal gait, decreased activity tolerance, decreased balance, decreased mobility, difficulty walking, decreased ROM, decreased strength, increased fascial restrictions, impaired UE functional use, and pain.   ACTIVITY LIMITATIONS carrying, lifting, bending, standing, squatting, stairs, transfers, bed mobility, reach over head, and locomotion level  PARTICIPATION LIMITATIONS: meal prep, cleaning, driving, shopping, community activity, occupation, and yard work  PERSONAL FACTORS 3+ comorbidities:  hip dislocation RTC tears; scoliosis; DDD of multiple joints; comression fx of the spine   are also affecting patient's functional outcome.   REHAB POTENTIAL: Good  CLINICAL DECISION MAKING: Unstable/unpredictable hip has dislocated since surgery; flutuating pain levels   EVALUATION COMPLEXITY: High   GOALS: Goals reviewed with patient? Yes  SHORT TERM GOALS: Target date: 9/5 Patient will increase right active shoulder flexion to 100 degree s Baseline: improving flexion bilateral  Goal status: ongoing   2.  Patient will increase gross right shoulder strength by 5 lbs  Baseline:  Goal status: just measured with dyno today   3.  Patient will report a 50% reduction of pain in her shoulders while using her walker  Baseline:  Goal status: still pain ful ongoing     LONG TERM GOALS: Target date:06/01/22  Patent will be independent with an UE shoulder strength and stretching program  Baseline:  Goal status: progressing   2.  Patient will use both arms for mobility and ADL's with a 75% self reported reduction in pain Baseline:  Goal status: still having pain but improved   3.  Patient will lift her walker into and out of the car to improve her ability to perform community activity. Baseline:  Goal Status:  New    PLAN: PT FREQUENCY: 2x/week  PT DURATION: 8  weeks  PLANNED INTERVENTIONS: Therapeutic exercises, Therapeutic activity, Neuromuscular re-education, Balance training, Gait training, Patient/Family education, Self Care, Joint mobilization, Aquatic Therapy, Dry Needling, Electrical stimulation, Cryotherapy, Moist heat, Taping, and Manual therapy  PLAN FOR NEXT SESSION: in the pool and land work on ROM, scap stability shoulder strengthening; manual therapy to the neck. Add in general mobility when cleared for the hip  Carolyne Littles PT DPT 05/18/22 12:31 PM [facility] [address] [place], [place], [address] Phone: [phone]   Fax:  [phone]

## 2022-05-21 ENCOUNTER — Encounter (HOSPITAL_BASED_OUTPATIENT_CLINIC_OR_DEPARTMENT_OTHER): Payer: Self-pay | Admitting: Physical Therapy

## 2022-05-21 ENCOUNTER — Ambulatory Visit (HOSPITAL_BASED_OUTPATIENT_CLINIC_OR_DEPARTMENT_OTHER): Payer: Medicare Other | Admitting: Physical Therapy

## 2022-05-21 DIAGNOSIS — M25511 Pain in right shoulder: Secondary | ICD-10-CM | POA: Diagnosis not present

## 2022-05-21 DIAGNOSIS — M545 Low back pain, unspecified: Secondary | ICD-10-CM | POA: Diagnosis not present

## 2022-05-21 DIAGNOSIS — M25552 Pain in left hip: Secondary | ICD-10-CM

## 2022-05-21 DIAGNOSIS — G8929 Other chronic pain: Secondary | ICD-10-CM

## 2022-05-21 DIAGNOSIS — M25551 Pain in right hip: Secondary | ICD-10-CM | POA: Diagnosis not present

## 2022-05-21 DIAGNOSIS — R2689 Other abnormalities of gait and mobility: Secondary | ICD-10-CM | POA: Diagnosis not present

## 2022-05-21 NOTE — Therapy (Signed)
OUTPATIENT PHYSICAL THERAPY SHOULDER TREATMENT NOTE/progress   Patient Name: Virginia Gordon MRN: [id number] DOB:Nov 27, 1950, 71 y.o., female Today's Date: 05/21/2022   PT End of Session - 05/21/22 1138     Visit Number 22    Number of Visits 32    Date for PT Re-Evaluation 06/25/22    Authorization Type Progress note done on the 20th visit    PT Start Time 1116    PT Stop Time 1200    PT Time Calculation (min) 44 min    Activity Tolerance Patient tolerated treatment well    Behavior During Therapy [facility] for tasks assessed/performed              Past Medical History:  Diagnosis Date   Rheumatoid arthritis ([place])    Past Surgical History:  Procedure Laterality Date   BREAST BIOPSY Left    pt not sure of date.   HIP SURGERY     SHOULDER SURGERY     Patient Active Problem List   Diagnosis Date Noted   Effusion of left knee joint 08/17/2021   ADD (attention deficit disorder) 11/13/2015   Connective tissue disease overlap syndrome ([place]) 11/13/2015   Menopausal and perimenopausal disorder 11/13/2015   Acne erythematosa 11/13/2015   Disordered sleep 11/13/2015   History of operative procedure on hip 07/03/2015   Left knee pain 05/20/2014   Right foot pain 05/14/2014   Bilateral shoulder pain 05/14/2014   Hypothyroidism 03/08/2014   Degenerative arthritis of hip 03/07/2012    PCP: [name]   REFERRING PROVIDER: [name] MD   REFERRING DIAG: R29.898 (ICD-10-CM) - Other symptoms and signs involving the musculoskeletal system  THERAPY DIAG:  Chronic right shoulder pain  Chronic bilateral low back pain without sciatica  Pain in right hip  Pain in left hip  Rationale for Evaluation and Treatment Rehabilitation  ONSET DATE: Hip Revision 01/04/2022 Initial surgery was March 7th    SUBJECTIVE:                                                                                                                                                                                       SUBJECTIVE STATEMENT: Patient reports she over did it riding stationary bike this week (3x).  Has increased right hip pain   PERTINENT HISTORY: Charcott foot, Left hip replacement and revision; right hip revision, Scoliosis, DDD, Lumbar compression fracture; Right Shoulder RTC tear( repaired 2x) Rheumatoid arthritis ( although tests were negative; R foot pain;   PAIN:  Are you having pain? yes: NPRS scale: 6/10   Pain location: shoulders bilat Pain description: aching  Aggravating factors: use of her shoulders and  pressure  Relieving factors: rest    PRECAUTIONS: No work on the hip at this time   WEIGHT BEARING RESTRICTIONS Yes WBAT   FALLS:  Has patient fallen in last 6 months? Yes. Number of falls had 1 fall prior to her dislocation Since the surgery no falls  No LIVING ENVIRONMENT: No steps at her house  OCCUPATION: Retried   PLOF: Requires assistive device for independence  PATIENT GOALS   OBJECTIVE:   DIAGNOSTIC FINDINGS:  Has had frequent follow up x-rays. Per patient everything looks good   PATIENT SURVEYS:  FOTO    COGNITION:  Overall cognitive status: Within functional limits for tasks assessed     SENSATION: WFL  POSTURE: Raised shoulders bilateral   UPPER EXTREMITY ROM:   MMT ROM Right eval Left eval Right  10/14 Left  10/14  Shoulder flexion 4/5 4+/5 4+/5 11.1 4+/5 11.7  Shoulder extension      Shoulder abduction      Shoulder adduction      Shoulder internal rotation 4+/5 4+/5 9.9 13.1  Shoulder external rotation 3+/5 3+/5  9.8 10.3  Elbow flexion      Elbow extension      Wrist flexion      Wrist extension      Wrist ulnar deviation      Wrist radial deviation      Wrist pronation      Wrist supination      (Blank rows = not tested)  UPPER EXTREMITY MMT:  AROM  Right eval Left eval Right  Left   Shoulder flexion 94 140 105 148  Shoulder extension      Shoulder abduction      Shoulder adduction       Shoulder internal rotation Full  Full     Shoulder external rotation Can reach behind her head but needs to put her head forward  Can reach behind her head but needs to put her head forward Improved  Improved   Middle trapezius      Lower trapezius      Elbow flexion      Elbow extension      Wrist flexion      Wrist extension      Wrist ulnar deviation      Wrist radial deviation      Wrist pronation      Wrist supination      Grip strength (lbs)      (Blank rows = not tested)  SHOULDER SPECIAL TESTS:    PALPATION:  Tenderness to palpation in the upper back    TODAY'S TREATMENT:  10/20 9/28 Pt seen for aquatic therapy today.  Treatment took place in water 3.25-4.5 ft in depth at the [facility] [facility]. Temp of water was 91.  Pt entered/exited the pool via stairs independently with single rail.   Straddling yellow noodle: gentle cycling with breast stroke arms forward and backward, arm abdct/ add; shoulder retraction Continued instruction on side plank, initially on step able to hold R/L x 20s; added leg lifts x 10 After several tries with different floatation devices pt able to gain side plank using white barbell x 3s toward right Plank on white barbell 2x 20s hold   Above position with arm pull downs 2x5 Above position with le on surface 2x20s hold Plank on bench with hip extension x 10; Sititng balance for core engagement on sm squoodle. Holding x 20 sec hands; then hands out of water x 20s; progressed to  pushing buoy under pt in seated position holding  Instruction and completion of reverse plank with assistance.  Pt able to gain position and hold briefly     Pt requires the buoyancy and hydrostatic pressure of water for support, and to offload joints by unweighting joint load by at least 50 % in navel deep water and by at least 75-80% in chest to neck deep water.  Viscosity of the water is needed for resistance of strengthening. Water current perturbations provides  challenge to standing balance requiring increased core activation.   10/16 Manual: Trigger point release to upper trap; PROM into ER and flexion both arms today    Wand flexion 2lbs x2x15   pulley 2 min flexion  Scaption 2x min  10/14 Manual: Trigger point release to upper trap; PROM into ER and flexion more focusleft today    Wand flexion 2lbs x2x15  Serratus press 3x15 3lbs   Chest press 3x15 lbs  Biceps curl 3x15  3lbs  Row 2x20 green  Shoulder extension 2x20 green  Supine ABC 2lbs        PATIENT EDUCATION: Education details: reviewed HEP; symptom management; importance of following hip precautions and recommendations  Person educated: Patient Education method: Explanation Education comprehension: verbalized understanding, returned demonstration, verbal cues required, tactile cues required, and needs further education   HOME EXERCISE PROGRAM:  [id number]   ASSESSMENT:  CLINICAL IMPRESSION: Pt is instructed not to use recumbent bike until hip pain resolves.  She vu. Focus on core strength and UE engagement with all exercises in pool.  She tolerates without increase in pain. She reports compliance with exercises she completes [facility] at [facility] but not great compliance with ue ex as per land based therapist. Goals ongoing  The patient had increased spasming in both shoulders today. Therapy focused on manual therapy and light AAROM to reduce pain. She reported improved pain after treatment. She was advised that some soreness after exercises is normal but it should last 24-48 hours. We will continue to advance her towards a home program for her shoulders.   OBJECTIVE IMPAIRMENTS Abnormal gait, decreased activity tolerance, decreased balance, decreased mobility, difficulty walking, decreased ROM, decreased strength, increased fascial restrictions, impaired UE functional use, and pain.   ACTIVITY LIMITATIONS carrying, lifting, bending, standing, squatting, stairs,  transfers, bed mobility, reach over head, and locomotion level  PARTICIPATION LIMITATIONS: meal prep, cleaning, driving, shopping, community activity, occupation, and yard work  PERSONAL FACTORS 3+ comorbidities:  hip dislocation RTC tears; scoliosis; DDD of multiple joints; comression fx of the spine   are also affecting patient's functional outcome.   REHAB POTENTIAL: Good  CLINICAL DECISION MAKING: Unstable/unpredictable hip has dislocated since surgery; flutuating pain levels   EVALUATION COMPLEXITY: High   GOALS: Goals reviewed with patient? Yes  SHORT TERM GOALS: Target date: 9/5 Patient will increase right active shoulder flexion to 100 degree s Baseline: improving flexion bilateral  Goal status: ongoing   2.  Patient will increase gross right shoulder strength by 5 lbs  Baseline:  Goal status: just measured with dyno today   3.  Patient will report a 50% reduction of pain in her shoulders while using her walker  Baseline:  Goal status: still pain ful ongoing     LONG TERM GOALS: Target date:06/01/22  Patent will be independent with an UE shoulder strength and stretching program  Baseline:  Goal status: progressing   2.  Patient will use both arms for mobility and ADL's with a 75% self reported reduction  in pain Baseline:  Goal status: still having pain but improved   3.  Patient will lift her walker into and out of the car to improve her ability to perform community activity. Baseline:  Goal Status:  New    PLAN: PT FREQUENCY: 2x/week  PT DURATION: 8 weeks  PLANNED INTERVENTIONS: Therapeutic exercises, Therapeutic activity, Neuromuscular re-education, Balance training, Gait training, Patient/Family education, Self Care, Joint mobilization, Aquatic Therapy, Dry Needling, Electrical stimulation, Cryotherapy, Moist heat, Taping, and Manual therapy  PLAN FOR NEXT SESSION: in the pool and land work on ROM, scap stability shoulder strengthening; manual therapy  to the neck. Add in general mobility when cleared for the hip  Annamarie Major) Boden Stucky MPT 05/21/22 11:41 AM [place], [place], [address] Phone: [phone]   Fax:  [phone]

## 2022-05-24 ENCOUNTER — Ambulatory Visit (HOSPITAL_BASED_OUTPATIENT_CLINIC_OR_DEPARTMENT_OTHER): Payer: Medicare Other | Admitting: Physical Therapy

## 2022-05-25 DIAGNOSIS — L719 Rosacea, unspecified: Secondary | ICD-10-CM | POA: Diagnosis not present

## 2022-05-26 NOTE — Therapy (Signed)
OUTPATIENT PHYSICAL THERAPY SHOULDER TREATMENT NOTE/progress   Patient Name: Virginia Gordon MRN: [id number] DOB:[birth year], 71 y.o., female Today's Date: 05/27/2022   PT End of Session - 05/27/22 0956     Visit Number 23    Number of Visits 32    Date for PT Re-Evaluation 06/25/22    Authorization Type Progress note done on the 20th visit    PT Start Time 0950    PT Stop Time 1030    PT Time Calculation (min) 40 min    Activity Tolerance Patient tolerated treatment well    Behavior During Therapy [facility] for tasks assessed/performed               Past Medical History:  Diagnosis Date   Rheumatoid arthritis ([place])    Past Surgical History:  Procedure Laterality Date   BREAST BIOPSY Left    pt not sure of date.   HIP SURGERY     SHOULDER SURGERY     Patient Active Problem List   Diagnosis Date Noted   Effusion of left knee joint 08/17/2021   ADD (attention deficit disorder) 11/13/2015   Connective tissue disease overlap syndrome ([place]) 11/13/2015   Menopausal and perimenopausal disorder 11/13/2015   Acne erythematosa 11/13/2015   Disordered sleep 11/13/2015   History of operative procedure on hip 07/03/2015   Left knee pain 05/20/2014   Right foot pain 05/14/2014   Bilateral shoulder pain 05/14/2014   Hypothyroidism 03/08/2014   Degenerative arthritis of hip 03/07/2012    PCP: [name]   REFERRING PROVIDER: [name] MD   REFERRING DIAG: R29.898 (ICD-10-CM) - Other symptoms and signs involving the musculoskeletal system  THERAPY DIAG:  Chronic right shoulder pain  Chronic bilateral low back pain without sciatica  Pain in right hip  Rationale for Evaluation and Treatment Rehabilitation  ONSET DATE: Hip Revision 01/04/2022 Initial surgery was March 7th    SUBJECTIVE:                                                                                                                                                                                       SUBJECTIVE STATEMENT: Patient reports not completing exercises since this weekend as she had company "The exercises dave gave me probably would help me more if I actually did them"   PERTINENT HISTORY: Charcott foot, Left hip replacement and revision; right hip revision, Scoliosis, DDD, Lumbar compression fracture; Right Shoulder RTC tear( repaired 2x) Rheumatoid arthritis ( although tests were negative; R foot pain;   PAIN:  Are you having pain? yes: NPRS scale: 4/10   Pain location: shoulders bilat Pain description: aching  Aggravating factors:  use of her shoulders and pressure  Relieving factors: rest    PRECAUTIONS: No work on the hip at this time   WEIGHT BEARING RESTRICTIONS Yes WBAT   FALLS:  Has patient fallen in last 6 months? Yes. Number of falls had 1 fall prior to her dislocation Since the surgery no falls  No LIVING ENVIRONMENT: No steps at her house  OCCUPATION: Retried   PLOF: Requires assistive device for independence  PATIENT GOALS   OBJECTIVE:   DIAGNOSTIC FINDINGS:  Has had frequent follow up x-rays. Per patient everything looks good   PATIENT SURVEYS:  FOTO    COGNITION:  Overall cognitive status: Within functional limits for tasks assessed     SENSATION: WFL  POSTURE: Raised shoulders bilateral   UPPER EXTREMITY ROM:   MMT ROM Right eval Left eval Right  10/14 Left  10/14  Shoulder flexion 4/5 4+/5 4+/5 11.1 4+/5 11.7  Shoulder extension      Shoulder abduction      Shoulder adduction      Shoulder internal rotation 4+/5 4+/5 9.9 13.1  Shoulder external rotation 3+/5 3+/5  9.8 10.3  Elbow flexion      Elbow extension      Wrist flexion      Wrist extension      Wrist ulnar deviation      Wrist radial deviation      Wrist pronation      Wrist supination      (Blank rows = not tested)  UPPER EXTREMITY MMT:  AROM  Right eval Left eval Right  Left   Shoulder flexion 94 140 105 148  Shoulder extension      Shoulder  abduction      Shoulder adduction      Shoulder internal rotation Full  Full     Shoulder external rotation Can reach behind her head but needs to put her head forward  Can reach behind her head but needs to put her head forward Improved  Improved   Middle trapezius      Lower trapezius      Elbow flexion      Elbow extension      Wrist flexion      Wrist extension      Wrist ulnar deviation      Wrist radial deviation      Wrist pronation      Wrist supination      Grip strength (lbs)      (Blank rows = not tested)  SHOULDER SPECIAL TESTS:    PALPATION:  Tenderness to palpation in the upper back    TODAY'S TREATMENT:  10/20 9/28 Pt seen for aquatic therapy today.  Treatment took place in water 3.25-4.5 ft in depth at the [facility]. Temp of water was 91.  Pt entered/exited the pool via stairs independently with single rail.   Straddling yellow noodle: gentle cycling with breast stroke arms forward and backward, arm abdct/ add; shoulder retraction Sititng balance for core engagement on sm squoodle. Holding x 20 sec hands; then hands out of water x 20s; progressed to pushing buoy under pt in seated position holding  Plank on white barbell then black barbell  Above position with arm pull down Above position with le on surface  Above position with hip extension  Above position with upright row   Pt requires the buoyancy and hydrostatic pressure of water for support, and to offload joints by unweighting joint load by at least 50 %  in navel deep water and by at least 75-80% in chest to neck deep water.  Viscosity of the water is needed for resistance of strengthening. Water current perturbations provides challenge to standing balance requiring increased core activation.   10/16 Manual: Trigger point release to upper trap; PROM into ER and flexion both arms today    Wand flexion 2lbs x2x15   pulley 2 min flexion  Scaption 2x min  10/14 Manual: Trigger point  release to upper trap; PROM into ER and flexion more focusleft today    Wand flexion 2lbs x2x15  Serratus press 3x15 3lbs   Chest press 3x15 lbs  Biceps curl 3x15  3lbs  Row 2x20 green  Shoulder extension 2x20 green  Supine ABC 2lbs        PATIENT EDUCATION: Education details: reviewed HEP; symptom management; importance of following hip precautions and recommendations  Person educated: Patient Education method: Explanation Education comprehension: verbalized understanding, returned demonstration, verbal cues required, tactile cues required, and needs further education   HOME EXERCISE PROGRAM:  [id number]   ASSESSMENT:  CLINICAL IMPRESSION: Pt progressing nicely with core and ue strength using water Pilates techniques.  She does not report any discomfort today. Given advanced instruction cuing as well as demo on higher level Pilates exercises which she executes fairly well. Will put together and laminate HEP, issuing it next visit.  She may be reaching her max potential in setting.    OBJECTIVE IMPAIRMENTS Abnormal gait, decreased activity tolerance, decreased balance, decreased mobility, difficulty walking, decreased ROM, decreased strength, increased fascial restrictions, impaired UE functional use, and pain.   ACTIVITY LIMITATIONS carrying, lifting, bending, standing, squatting, stairs, transfers, bed mobility, reach over head, and locomotion level  PARTICIPATION LIMITATIONS: meal prep, cleaning, driving, shopping, community activity, occupation, and yard work  PERSONAL FACTORS 3+ comorbidities:  hip dislocation RTC tears; scoliosis; DDD of multiple joints; comression fx of the spine   are also affecting patient's functional outcome.   REHAB POTENTIAL: Good  CLINICAL DECISION MAKING: Unstable/unpredictable hip has dislocated since surgery; flutuating pain levels   EVALUATION COMPLEXITY: High   GOALS: Goals reviewed with patient? Yes  SHORT TERM GOALS: Target  date: 9/5 Patient will increase right active shoulder flexion to 100 degree s Baseline: improving flexion bilateral  Goal status: ongoing   2.  Patient will increase gross right shoulder strength by 5 lbs  Baseline:  Goal status: just measured with dyno today   3.  Patient will report a 50% reduction of pain in her shoulders while using her walker  Baseline:  Goal status: still pain ful ongoing     LONG TERM GOALS: Target date:06/01/22  Patent will be independent with an UE shoulder strength and stretching program  Baseline:  Goal status: progressing   2.  Patient will use both arms for mobility and ADL's with a 75% self reported reduction in pain Baseline:  Goal status: still having pain but improved   3.  Patient will lift her walker into and out of the car to improve her ability to perform community activity. Baseline:  Goal Status:  New    PLAN: PT FREQUENCY: 2x/week  PT DURATION: 8 weeks  PLANNED INTERVENTIONS: Therapeutic exercises, Therapeutic activity, Neuromuscular re-education, Balance training, Gait training, Patient/Family education, Self Care, Joint mobilization, Aquatic Therapy, Dry Needling, Electrical stimulation, Cryotherapy, Moist heat, Taping, and Manual therapy  PLAN FOR NEXT SESSION: in the pool and land work on ROM, scap stability shoulder strengthening; manual therapy to the neck. Add in general  mobility when cleared for the hip  Annamarie Major) Esco Joslyn MPT 05/27/22 1:46 PM [facility] [address] [place], [place], [address] Phone: [phone]   Fax:  [phone]

## 2022-05-27 ENCOUNTER — Encounter (HOSPITAL_BASED_OUTPATIENT_CLINIC_OR_DEPARTMENT_OTHER): Payer: Self-pay | Admitting: Physical Therapy

## 2022-05-27 ENCOUNTER — Ambulatory Visit (HOSPITAL_BASED_OUTPATIENT_CLINIC_OR_DEPARTMENT_OTHER): Payer: Medicare Other | Admitting: Physical Therapy

## 2022-05-27 DIAGNOSIS — M25552 Pain in left hip: Secondary | ICD-10-CM | POA: Diagnosis not present

## 2022-05-27 DIAGNOSIS — G8929 Other chronic pain: Secondary | ICD-10-CM

## 2022-05-27 DIAGNOSIS — M25511 Pain in right shoulder: Secondary | ICD-10-CM | POA: Diagnosis not present

## 2022-05-27 DIAGNOSIS — M545 Low back pain, unspecified: Secondary | ICD-10-CM | POA: Diagnosis not present

## 2022-05-27 DIAGNOSIS — M25551 Pain in right hip: Secondary | ICD-10-CM

## 2022-05-27 DIAGNOSIS — R2689 Other abnormalities of gait and mobility: Secondary | ICD-10-CM | POA: Diagnosis not present

## 2022-05-28 ENCOUNTER — Encounter (HOSPITAL_BASED_OUTPATIENT_CLINIC_OR_DEPARTMENT_OTHER): Payer: Medicare Other | Admitting: Physical Therapy

## 2022-05-28 ENCOUNTER — Ambulatory Visit (HOSPITAL_BASED_OUTPATIENT_CLINIC_OR_DEPARTMENT_OTHER): Payer: Medicare Other | Admitting: Physical Therapy

## 2022-05-28 ENCOUNTER — Encounter (HOSPITAL_BASED_OUTPATIENT_CLINIC_OR_DEPARTMENT_OTHER): Payer: Self-pay | Admitting: Physical Therapy

## 2022-05-28 DIAGNOSIS — G8929 Other chronic pain: Secondary | ICD-10-CM

## 2022-05-28 DIAGNOSIS — R2689 Other abnormalities of gait and mobility: Secondary | ICD-10-CM

## 2022-05-28 DIAGNOSIS — M25552 Pain in left hip: Secondary | ICD-10-CM | POA: Diagnosis not present

## 2022-05-28 DIAGNOSIS — M25551 Pain in right hip: Secondary | ICD-10-CM | POA: Diagnosis not present

## 2022-05-28 DIAGNOSIS — M545 Low back pain, unspecified: Secondary | ICD-10-CM | POA: Diagnosis not present

## 2022-05-28 DIAGNOSIS — M25511 Pain in right shoulder: Secondary | ICD-10-CM | POA: Diagnosis not present

## 2022-05-28 NOTE — Therapy (Signed)
OUTPATIENT PHYSICAL THERAPY SHOULDER TREATMENT NOTE/progress   Patient Name: Virginia Gordon MRN: [id number] DOB:09-02-1950, 71 y.o., female Today's Date: 05/28/2022   PT End of Session - 05/28/22 1158     Visit Number 24    Number of Visits 32    Date for PT Re-Evaluation 06/25/22    Authorization Type Progress note done on the 20th visit    PT Start Time 1116    PT Stop Time 1200    PT Time Calculation (min) 44 min    Activity Tolerance Patient tolerated treatment well    Behavior During Therapy [facility] for tasks assessed/performed                Past Medical History:  Diagnosis Date   Rheumatoid arthritis ([place])    Past Surgical History:  Procedure Laterality Date   BREAST BIOPSY Left    pt not sure of date.   HIP SURGERY     SHOULDER SURGERY     Patient Active Problem List   Diagnosis Date Noted   Effusion of left knee joint 08/17/2021   ADD (attention deficit disorder) 11/13/2015   Connective tissue disease overlap syndrome ([place]) 11/13/2015   Menopausal and perimenopausal disorder 11/13/2015   Acne erythematosa 11/13/2015   Disordered sleep 11/13/2015   History of operative procedure on hip 07/03/2015   Left knee pain 05/20/2014   Right foot pain 05/14/2014   Bilateral shoulder pain 05/14/2014   Hypothyroidism 03/08/2014   Degenerative arthritis of hip 03/07/2012    PCP: [name]   REFERRING PROVIDER: [name] MD   REFERRING DIAG: R29.898 (ICD-10-CM) - Other symptoms and signs involving the musculoskeletal system  THERAPY DIAG:  Chronic right shoulder pain  Chronic bilateral low back pain without sciatica  Pain in right hip  Pain in left hip  Other abnormalities of gait and mobility  Rationale for Evaluation and Treatment Rehabilitation  ONSET DATE: Hip Revision 01/04/2022 Initial surgery was March 7th    SUBJECTIVE:                                                                                                                                                                                       SUBJECTIVE STATEMENT: Pt reports being very tired after last session but not hurting.   PERTINENT HISTORY: Charcott foot, Left hip replacement and revision; right hip revision, Scoliosis, DDD, Lumbar compression fracture; Right Shoulder RTC tear( repaired 2x) Rheumatoid arthritis ( although tests were negative; R foot pain;   PAIN:  Are you having pain? yes: NPRS scale: 4/10   Pain location: shoulders bilat Pain description: aching  Aggravating factors: use of her  shoulders and pressure  Relieving factors: rest    PRECAUTIONS: No work on the hip at this time   WEIGHT BEARING RESTRICTIONS Yes WBAT   FALLS:  Has patient fallen in last 6 months? Yes. Number of falls had 1 fall prior to her dislocation Since the surgery no falls  No LIVING ENVIRONMENT: No steps at her house  OCCUPATION: Retried   PLOF: Requires assistive device for independence  PATIENT GOALS   OBJECTIVE:   DIAGNOSTIC FINDINGS:  Has had frequent follow up x-rays. Per patient everything looks good   PATIENT SURVEYS:  FOTO    COGNITION:  Overall cognitive status: Within functional limits for tasks assessed     SENSATION: WFL  POSTURE: Raised shoulders bilateral   UPPER EXTREMITY ROM:   MMT ROM Right eval Left eval Right  10/14 Left  10/14  Shoulder flexion 4/5 4+/5 4+/5 11.1 4+/5 11.7  Shoulder extension      Shoulder abduction      Shoulder adduction      Shoulder internal rotation 4+/5 4+/5 9.9 13.1  Shoulder external rotation 3+/5 3+/5  9.8 10.3  Elbow flexion      Elbow extension      Wrist flexion      Wrist extension      Wrist ulnar deviation      Wrist radial deviation      Wrist pronation      Wrist supination      (Blank rows = not tested)  UPPER EXTREMITY MMT:  AROM  Right eval Left eval Right  Left   Shoulder flexion 94 140 105 148  Shoulder extension      Shoulder abduction      Shoulder  adduction      Shoulder internal rotation Full  Full     Shoulder external rotation Can reach behind her head but needs to put her head forward  Can reach behind her head but needs to put her head forward Improved  Improved   Middle trapezius      Lower trapezius      Elbow flexion      Elbow extension      Wrist flexion      Wrist extension      Wrist ulnar deviation      Wrist radial deviation      Wrist pronation      Wrist supination      Grip strength (lbs)      (Blank rows = not tested)  SHOULDER SPECIAL TESTS:    PALPATION:  Tenderness to palpation in the upper back    TODAY'S TREATMENT:  10/27 Pt issued laminated aquatic final HEP.  Reviewed as we completed  Pt seen for aquatic therapy today.  Treatment took place in water 3.25-4.5 ft in depth at the [facility]. Temp of water was 91.  Pt entered/exited the pool via stairs independently with single rail.   Sititng balance for core engagement on sm squoodle. Holding x 20 sec hands; then hands out of water x 20s; progressed to pushing buoy under pt in seated position holding  Plank on  black barbell  Above position with arm pull down Above position with le on surface  Above position with hip extension  Above position with upright row Kick board row Noodle push down 1 foam hand buoy IR/ER Resisted horizontal shoulder add/abd 1 foam hand buoy   Pt requires the buoyancy and hydrostatic pressure of water for support, and to offload joints  by unweighting joint load by at least 50 % in navel deep water and by at least 75-80% in chest to neck deep water.  Viscosity of the water is needed for resistance of strengthening. Water current perturbations provides challenge to standing balance requiring increased core activation.   10/16 Manual: Trigger point release to upper trap; PROM into ER and flexion both arms today    Wand flexion 2lbs x2x15   pulley 2 min flexion  Scaption 2x min  10/14 Manual:  Trigger point release to upper trap; PROM into ER and flexion more focusleft today    Wand flexion 2lbs x2x15  Serratus press 3x15 3lbs   Chest press 3x15 lbs  Biceps curl 3x15  3lbs  Row 2x20 green  Shoulder extension 2x20 green  Supine ABC 2lbs        PATIENT EDUCATION: Education details: reviewed HEP; symptom management; importance of following hip precautions and recommendations  Person educated: Patient Education method: Explanation Education comprehension: verbalized understanding, returned demonstration, verbal cues required, tactile cues required, and needs further education   HOME EXERCISE PROGRAM:  [id number]   ASSESSMENT:  CLINICAL IMPRESSION: Pt with laminated copy of final aquatic program.  She is directed through to ensure understanding and indep with. She demonstrates good core strength and control with all challenges and is instructed on how to increase challenge going forward. She has reached her max potential in setting. Goals ongoing     OBJECTIVE IMPAIRMENTS Abnormal gait, decreased activity tolerance, decreased balance, decreased mobility, difficulty walking, decreased ROM, decreased strength, increased fascial restrictions, impaired UE functional use, and pain.   ACTIVITY LIMITATIONS carrying, lifting, bending, standing, squatting, stairs, transfers, bed mobility, reach over head, and locomotion level  PARTICIPATION LIMITATIONS: meal prep, cleaning, driving, shopping, community activity, occupation, and yard work  PERSONAL FACTORS 3+ comorbidities:  hip dislocation RTC tears; scoliosis; DDD of multiple joints; comression fx of the spine   are also affecting patient's functional outcome.   REHAB POTENTIAL: Good  CLINICAL DECISION MAKING: Unstable/unpredictable hip has dislocated since surgery; flutuating pain levels   EVALUATION COMPLEXITY: High   GOALS: Goals reviewed with patient? Yes  SHORT TERM GOALS: Target date: 9/5 Patient will  increase right active shoulder flexion to 100 degree s Baseline: improving flexion bilateral  Goal status: ongoing   2.  Patient will increase gross right shoulder strength by 5 lbs  Baseline:  Goal status: just measured with dyno today   3.  Patient will report a 50% reduction of pain in her shoulders while using her walker  Baseline:  Goal status: still pain ful ongoing     LONG TERM GOALS: Target date:06/01/22  Patent will be independent with an UE shoulder strength and stretching program  Baseline:  Goal status: progressing   2.  Patient will use both arms for mobility and ADL's with a 75% self reported reduction in pain Baseline:  Goal status: still having pain but improved   3.  Patient will lift her walker into and out of the car to improve her ability to perform community activity. Baseline:  Goal Status:  New    PLAN: PT FREQUENCY: 2x/week  PT DURATION: 8 weeks  PLANNED INTERVENTIONS: Therapeutic exercises, Therapeutic activity, Neuromuscular re-education, Balance training, Gait training, Patient/Family education, Self Care, Joint mobilization, Aquatic Therapy, Dry Needling, Electrical stimulation, Cryotherapy, Moist heat, Taping, and Manual therapy  PLAN FOR NEXT SESSION: in the pool and land work on ROM, scap stability shoulder strengthening; manual therapy to the neck. Add in  general mobility when cleared for the hip  Annamarie Major) Brayton Baumgartner MPT 05/28/22 12:00 PM [place], [place], [address] Phone: [phone]   Fax:  [phone]

## 2022-06-04 ENCOUNTER — Ambulatory Visit (HOSPITAL_BASED_OUTPATIENT_CLINIC_OR_DEPARTMENT_OTHER): Payer: Medicare Other | Attending: Family Medicine | Admitting: Physical Therapy

## 2022-06-04 DIAGNOSIS — M545 Low back pain, unspecified: Secondary | ICD-10-CM | POA: Diagnosis not present

## 2022-06-04 DIAGNOSIS — M25551 Pain in right hip: Secondary | ICD-10-CM | POA: Insufficient documentation

## 2022-06-04 DIAGNOSIS — M25511 Pain in right shoulder: Secondary | ICD-10-CM | POA: Insufficient documentation

## 2022-06-04 DIAGNOSIS — M25552 Pain in left hip: Secondary | ICD-10-CM | POA: Insufficient documentation

## 2022-06-04 DIAGNOSIS — R2689 Other abnormalities of gait and mobility: Secondary | ICD-10-CM | POA: Insufficient documentation

## 2022-06-04 DIAGNOSIS — G8929 Other chronic pain: Secondary | ICD-10-CM | POA: Diagnosis not present

## 2022-06-04 NOTE — Therapy (Signed)
OUTPATIENT PHYSICAL THERAPY SHOULDER TREATMENT NOTE   Patient Name: Virginia Gordon MRN: [id number] DOB:03/08/1951, 71 y.o., female Today's Date: 05/28/2022   PT End of Session - 05/28/22 1158     Visit Number 24    Number of Visits 32    Date for PT Re-Evaluation 06/25/22    Authorization Type Progress note done on the 20th visit    PT Start Time 1116    PT Stop Time 1200    PT Time Calculation (min) 44 min    Activity Tolerance Patient tolerated treatment well    Behavior During Therapy [facility] for tasks assessed/performed                Past Medical History:  Diagnosis Date   Rheumatoid arthritis ([place])    Past Surgical History:  Procedure Laterality Date   BREAST BIOPSY Left    pt not sure of date.   HIP SURGERY     SHOULDER SURGERY     Patient Active Problem List   Diagnosis Date Noted   Effusion of left knee joint 08/17/2021   ADD (attention deficit disorder) 11/13/2015   Connective tissue disease overlap syndrome ([place]) 11/13/2015   Menopausal and perimenopausal disorder 11/13/2015   Acne erythematosa 11/13/2015   Disordered sleep 11/13/2015   History of operative procedure on hip 07/03/2015   Left knee pain 05/20/2014   Right foot pain 05/14/2014   Bilateral shoulder pain 05/14/2014   Hypothyroidism 03/08/2014   Degenerative arthritis of hip 03/07/2012    PCP: [name]   REFERRING PROVIDER: [name] MD   REFERRING DIAG: R29.898 (ICD-10-CM) - Other symptoms and signs involving the musculoskeletal system  THERAPY DIAG:  Chronic right shoulder pain  Chronic bilateral low back pain without sciatica  Pain in right hip  Pain in left hip  Other abnormalities of gait and mobility  Rationale for Evaluation and Treatment Rehabilitation  ONSET DATE: Hip Revision 01/04/2022 Initial surgery was March 7th    SUBJECTIVE:                                                                                                                                                                                       SUBJECTIVE STATEMENT: The patient reports she has good days and bad days. She has been swimming.    PERTINENT HISTORY: Charcott foot, Left hip replacement and revision; right hip revision, Scoliosis, DDD, Lumbar compression fracture; Right Shoulder RTC tear( repaired 2x) Rheumatoid arthritis ( although tests were negative; R foot pain;   PAIN:  Are you having pain? yes: NPRS scale: 4/10   Pain location: shoulders bilat Pain description: aching  Aggravating  factors: use of her shoulders and pressure  Relieving factors: rest    PRECAUTIONS: No work on the hip at this time   WEIGHT BEARING RESTRICTIONS Yes WBAT   FALLS:  Has patient fallen in last 6 months? Yes. Number of falls had 1 fall prior to her dislocation Since the surgery no falls  No LIVING ENVIRONMENT: No steps at her house  OCCUPATION: Retried   PLOF: Requires assistive device for independence  PATIENT GOALS   OBJECTIVE:   DIAGNOSTIC FINDINGS:  Has had frequent follow up x-rays. Per patient everything looks good   PATIENT SURVEYS:  FOTO    COGNITION:  Overall cognitive status: Within functional limits for tasks assessed     SENSATION: WFL  POSTURE: Raised shoulders bilateral   UPPER EXTREMITY ROM:   MMT ROM Right eval Left eval Right  10/14 Left  10/14  Shoulder flexion 4/5 4+/5 4+/5 11.1 4+/5 11.7  Shoulder extension      Shoulder abduction      Shoulder adduction      Shoulder internal rotation 4+/5 4+/5 9.9 13.1  Shoulder external rotation 3+/5 3+/5  9.8 10.3  Elbow flexion      Elbow extension      Wrist flexion      Wrist extension      Wrist ulnar deviation      Wrist radial deviation      Wrist pronation      Wrist supination      (Blank rows = not tested)  UPPER EXTREMITY MMT:  AROM  Right eval Left eval Right  Left   Shoulder flexion 94 140 105 148  Shoulder extension      Shoulder abduction      Shoulder  adduction      Shoulder internal rotation Full  Full     Shoulder external rotation Can reach behind her head but needs to put her head forward  Can reach behind her head but needs to put her head forward Improved  Improved   Middle trapezius      Lower trapezius      Elbow flexion      Elbow extension      Wrist flexion      Wrist extension      Wrist ulnar deviation      Wrist radial deviation      Wrist pronation      Wrist supination      Grip strength (lbs)      (Blank rows = not tested)  SHOULDER SPECIAL TESTS:    PALPATION:  Tenderness to palpation in the upper back    TODAY'S TREATMENT:  11/3 Manual: Trigger point release to upper trap; PROM into ER and flexion more focusleft today    Wand flexion 3lbs x2x15  Serratus press 3x15 3lbs   Seated flexion 1 lbs 2lbs and 3 talked about progression towards lifting her walker into the car  Reviewed all gym exercises that she can do safely   Biceps curl 3x15  3lbs  Row 2x20 Blue  Shoulder extension 2x20 Blue  Supine ABC 3lbs   10/27 Pt issued laminated aquatic final HEP.  Reviewed as we completed  Pt seen for aquatic therapy today.  Treatment took place in water 3.25-4.5 ft in depth at the [place]. Temp of water was 91.  Pt entered/exited the pool via stairs independently with single rail.   Sititng balance for core engagement on sm squoodle. Holding x 20 sec hands; then  hands out of water x 20s; progressed to pushing buoy under pt in seated position holding  Plank on  black barbell  Above position with arm pull down Above position with le on surface  Above position with hip extension  Above position with upright row Kick board row Noodle push down 1 foam hand buoy IR/ER Resisted horizontal shoulder add/abd 1 foam hand buoy   Pt requires the buoyancy and hydrostatic pressure of water for support, and to offload joints by unweighting joint load by at least 50 % in navel deep water and by at  least 75-80% in chest to neck deep water.  Viscosity of the water is needed for resistance of strengthening. Water current perturbations provides challenge to standing balance requiring increased core activation.       PATIENT EDUCATION: Education details: reviewed HEP; symptom management; importance of following hip precautions and recommendations  Person educated: Patient Education method: Explanation Education comprehension: verbalized understanding, returned demonstration, verbal cues required, tactile cues required, and needs further education   HOME EXERCISE PROGRAM:  [id number]   ASSESSMENT:  CLINICAL IMPRESSION: The patient has reached max potential for land therapy at this time. She has a full program. She is working between land and water. She was advised to continue doing what she is doing. She has gone from being in a wheelchair to walking and swimming. She has been working on a lot of her shoulder exercises in the pool. We were able to advance her shoulder exercises on land. We also review the machines that she may be able to do on land.   OBJECTIVE IMPAIRMENTS Abnormal gait, decreased activity tolerance, decreased balance, decreased mobility, difficulty walking, decreased ROM, decreased strength, increased fascial restrictions, impaired UE functional use, and pain.   ACTIVITY LIMITATIONS carrying, lifting, bending, standing, squatting, stairs, transfers, bed mobility, reach over head, and locomotion level  PARTICIPATION LIMITATIONS: meal prep, cleaning, driving, shopping, community activity, occupation, and yard work  PERSONAL FACTORS 3+ comorbidities:  hip dislocation RTC tears; scoliosis; DDD of multiple joints; comression fx of the spine   are also affecting patient's functional outcome.   REHAB POTENTIAL: Good  CLINICAL DECISION MAKING: Unstable/unpredictable hip has dislocated since surgery; flutuating pain levels   EVALUATION COMPLEXITY: High   GOALS: Goals  reviewed with patient? Yes  SHORT TERM GOALS: Target date: 9/5 Patient will increase right active shoulder flexion to 100 degree s Baseline: improving flexion bilateral  Goal status: ongoing   2.  Patient will increase gross right shoulder strength by 5 lbs  Baseline:  Goal status: just measured with dyno today   3.  Patient will report a 50% reduction of pain in her shoulders while using her walker  Baseline:  Goal status: still pain ful ongoing     LONG TERM GOALS: Target date:06/01/22  Patent will be independent with an UE shoulder strength and stretching program  Baseline:  Goal status: progressing   2.  Patient will use both arms for mobility and ADL's with a 75% self reported reduction in pain Baseline:  Goal status: still having pain but improved   3.  Patient will lift her walker into and out of the car to improve her ability to perform community activity. Baseline:  Goal Status:  New    PLAN: PT FREQUENCY: 2x/week  PT DURATION: 8 weeks  PLANNED INTERVENTIONS: Therapeutic exercises, Therapeutic activity, Neuromuscular re-education, Balance training, Gait training, Patient/Family education, Self Care, Joint mobilization, Aquatic Therapy, Dry Needling, Electrical stimulation, Cryotherapy, Moist heat, Taping, and  Manual therapy  PLAN FOR NEXT SESSION: in the pool and land work on ROM, scap stability shoulder strengthening; manual therapy to the neck. Add in general mobility when cleared for the hip  Stanton Kidney Tharon Aquas) [place] MPT 05/28/22 12:00 PM [facility] [place], [place], [address] Phone: [phone]   Fax:  [phone]

## 2022-06-09 ENCOUNTER — Encounter (HOSPITAL_BASED_OUTPATIENT_CLINIC_OR_DEPARTMENT_OTHER): Payer: Medicare Other | Admitting: Physical Therapy

## 2022-06-14 ENCOUNTER — Encounter (HOSPITAL_BASED_OUTPATIENT_CLINIC_OR_DEPARTMENT_OTHER): Payer: Medicare Other | Admitting: Physical Therapy

## 2022-06-18 ENCOUNTER — Ambulatory Visit (HOSPITAL_BASED_OUTPATIENT_CLINIC_OR_DEPARTMENT_OTHER): Payer: Medicare Other | Admitting: Physical Therapy

## 2022-06-18 ENCOUNTER — Encounter (HOSPITAL_BASED_OUTPATIENT_CLINIC_OR_DEPARTMENT_OTHER): Payer: Self-pay | Admitting: Physical Therapy

## 2022-06-18 DIAGNOSIS — G8929 Other chronic pain: Secondary | ICD-10-CM | POA: Diagnosis not present

## 2022-06-18 DIAGNOSIS — M25552 Pain in left hip: Secondary | ICD-10-CM

## 2022-06-18 DIAGNOSIS — M25511 Pain in right shoulder: Secondary | ICD-10-CM | POA: Diagnosis not present

## 2022-06-18 DIAGNOSIS — M545 Low back pain, unspecified: Secondary | ICD-10-CM | POA: Diagnosis not present

## 2022-06-18 DIAGNOSIS — M25551 Pain in right hip: Secondary | ICD-10-CM

## 2022-06-18 DIAGNOSIS — R2689 Other abnormalities of gait and mobility: Secondary | ICD-10-CM | POA: Diagnosis not present

## 2022-06-18 NOTE — Therapy (Addendum)
OUTPATIENT PHYSICAL THERAPY SHOULDER TREATMENT NOTE/discharge   Patient Name: Virginia Gordon MRN: [id number] DOB:07/05/1951, 71 y.o., female Today's Date: 06/18/2022   PT End of Session - 06/18/22 1525     Visit Number 26    Number of Visits 32    Date for PT Re-Evaluation 06/25/22    PT Start Time 1521    PT Stop Time 1600    PT Time Calculation (min) 39 min    Activity Tolerance Patient tolerated treatment well    Behavior During Therapy WFL for tasks assessed/performed                Past Medical History:  Diagnosis Date   Rheumatoid arthritis ([place])    Past Surgical History:  Procedure Laterality Date   BREAST BIOPSY Left    pt not sure of date.   HIP SURGERY     SHOULDER SURGERY     Patient Active Problem List   Diagnosis Date Noted   Effusion of left knee joint 08/17/2021   ADD (attention deficit disorder) 11/13/2015   Connective tissue disease overlap syndrome ([place]) 11/13/2015   Menopausal and perimenopausal disorder 11/13/2015   Acne erythematosa 11/13/2015   Disordered sleep 11/13/2015   History of operative procedure on hip 07/03/2015   Left knee pain 05/20/2014   Right foot pain 05/14/2014   Bilateral shoulder pain 05/14/2014   Hypothyroidism 03/08/2014   Degenerative arthritis of hip 03/07/2012    PCP: [name]   REFERRING PROVIDER: [name] MD   REFERRING DIAG: R29.898 (ICD-10-CM) - Other symptoms and signs involving the musculoskeletal system  THERAPY DIAG:  Chronic right shoulder pain  Chronic bilateral low back pain without sciatica  Pain in right hip  Pain in left hip  Rationale for Evaluation and Treatment Rehabilitation  ONSET DATE: Hip Revision 01/04/2022 Initial surgery was March 7th    SUBJECTIVE:                                                                                                                                                                                      SUBJECTIVE STATEMENT: The  patient reports she has been trying to wean herself off of pain medication (tramadol or tylenol).  She took dose of tylenol in middle of night due to R shoulder pain.    PERTINENT HISTORY: Charcott foot, Left hip replacement and revision; right hip revision, Scoliosis, DDD, Lumbar compression fracture; Right Shoulder RTC tear( repaired 2x) Rheumatoid arthritis ( although tests were negative; R foot pain;   PAIN:  Are you having pain? yes: NPRS scale: 4/10   Pain location: shoulders bilat, generalized Pain description: aching  Aggravating  factors: use of her shoulders and pressure  Relieving factors: rest    PRECAUTIONS: No work on the hip at this time   WEIGHT BEARING RESTRICTIONS Yes WBAT   FALLS:  Has patient fallen in last 6 months? Yes. Number of falls had 1 fall prior to her dislocation Since the surgery no falls  No LIVING ENVIRONMENT: No steps at her house  OCCUPATION: Retried   PLOF: Requires assistive device for independence  PATIENT GOALS   OBJECTIVE:   DIAGNOSTIC FINDINGS:  Has had frequent follow up x-rays. Per patient everything looks good   PATIENT SURVEYS:  FOTO    COGNITION:  Overall cognitive status: Within functional limits for tasks assessed     SENSATION: WFL  POSTURE: Raised shoulders bilateral   UPPER EXTREMITY ROM:   MMT ROM Right eval Left eval Right  10/14 Left  10/14 Right 11/17 Left  11/17  Shoulder flexion 4/5 4+/5 4+/5 11.1 4+/5 11.7 10.4 13.1  Shoulder extension        Shoulder abduction        Shoulder adduction        Shoulder internal rotation 4+/5 4+/5 9.9 13.1 5.9   Shoulder external rotation 3+/5 3+/5  9.8 10.3 13.1   Elbow flexion        Elbow extension        Wrist flexion        Wrist extension        Wrist ulnar deviation        Wrist radial deviation        Wrist pronation        Wrist supination        (Blank rows = not tested)  UPPER EXTREMITY MMT:  AROM  Right eval Left eval Right  10/14 Left   10/14  Shoulder flexion 94 140 105 148  Shoulder extension      Shoulder abduction      Shoulder adduction      Shoulder internal rotation Full  Full     Shoulder external rotation Can reach behind her head but needs to put her head forward  Can reach behind her head but needs to put her head forward Improved  Improved   Middle trapezius      Lower trapezius      Elbow flexion      Elbow extension      Wrist flexion      Wrist extension      Wrist ulnar deviation      Wrist radial deviation      Wrist pronation      Wrist supination      Grip strength (lbs)      (Blank rows = not tested)  SHOULDER SPECIAL TESTS:    PALPATION:  Tenderness to palpation in the upper back    TODAY'S TREATMENT:  06/18/22: Reviewed form/technique with current HEP exercises Holding wall in deeper water - marching  Straddling yellow noodle: cycling with breast stroke, shoulder abdct/ add and flex to/from neutral Holding black barbell: plank position x 20 sec, then plank with barbell pull downs x 10; plank with LE suspended balancing x 20s x 3; Plank LE suspended with attempt at knee tuck x 3 Seated on thin square noodle:  pushing noodle down under, hips suspended x 20s x 2; hands on lap with single arm out of water for seated balance/core engagement;   Reverse plank x 15s Seated on bench with feet on blue step, blue short noodle  push downs, then isometrics holding 5-10s Kick board push/pull in staggered stance in  38f water  Wall push up/off in 4 ft   11/3 Manual: Trigger point release to upper trap; PROM into ER and flexion more focusleft today    Wand flexion 3lbs x2x15  Serratus press 3x15 3lbs   Seated flexion 1 lbs 2lbs and 3 talked about progression towards lifting her walker into the car  Reviewed all gym exercises that she can do safely   Biceps curl 3x15  3lbs  Row 2x20 Blue  Shoulder extension 2x20 Blue  Supine ABC 3lbs   10/27 Pt issued laminated aquatic final HEP.   Reviewed as we completed  Pt seen for aquatic therapy today.  Treatment [place] [facility] [place] [place]  Pt entered/exited the pool via stairs independently with single rail.   Sititng balance for core engagement on sm squoodle. Holding x 20 sec hands; then hands out of water x 20s; progressed to pushing buoy under pt in seated position holding  Plank on  black barbell  Above position with arm pull down Above position with le on surface  Above position with hip extension  Above position with upright row Kick board row Noodle push down 1 foam hand buoy IR/ER Resisted horizontal shoulder add/abd 1 foam hand buoy   Pt requires the buoyancy and hydrostatic pressure of water for support, and to offload joints by unweighting joint load by at least 50 % in navel deep water and by at least 75-80% in chest to neck deep water.  Viscosity of the water is needed for resistance of strengthening. Water current perturbations provides challenge to standing balance requiring increased core activation.       PATIENT EDUCATION: Education details: reviewed HEP; symptom management; importance of following hip precautions and recommendations  Person educated: Patient Education method: Explanation Education comprehension: verbalized understanding, returned demonstration, verbal cues required, tactile cues required, and needs further education   HOME EXERCISE PROGRAM:  [id number]   ASSESSMENT:  CLINICAL IMPRESSION: Pt reported no increase in pain while exercising in water; pain remained at 4/10.The patient has reached max potential for land and aquatic therapy at this time. She has a full program. She has gone from being in a wheelchair to walking and swimming. She has met most of her goals and verbalized readiness to d/c to HEP.      OBJECTIVE IMPAIRMENTS Abnormal gait, decreased activity tolerance, decreased balance, decreased  mobility, difficulty walking, decreased ROM, decreased strength, increased fascial restrictions, impaired UE functional use, and pain.   ACTIVITY LIMITATIONS carrying, lifting, bending, standing, squatting, stairs, transfers, bed mobility, reach over head, and locomotion level  PARTICIPATION LIMITATIONS: meal prep, cleaning, driving, shopping, community activity, occupation, and yard work  PERSONAL FACTORS 3+ comorbidities:  hip dislocation RTC tears; scoliosis; DDD of multiple joints; comression fx of the spine   are also affecting patient's functional outcome.   REHAB POTENTIAL: Good  CLINICAL DECISION MAKING: Unstable/unpredictable hip has dislocated since surgery; flutuating pain levels   EVALUATION COMPLEXITY: High   GOALS: Goals reviewed with patient? Yes  SHORT TERM GOALS: Target date: 9/5 Patient will increase right active shoulder flexion to 100 degree s Baseline: improving flexion bilateral  Goal status: Achieved -05/18/22   2.  Patient will increase gross right shoulder strength by 5 lbs  Baseline: Improved, but not met  Goal status: Not met -06/18/22  3.  Patient will report a  50% reduction of pain in her shoulders while using her walker  Baseline:  Goal status: Achieved - 06/18/22    LONG TERM GOALS: Target date:06/01/22  Patent will be independent with an UE shoulder strength and stretching program  Baseline:  Goal status: Achieved  - 06/18/22  2.  Patient will use both arms for mobility and ADL's with a 75% self reported reduction in pain Baseline:  Goal status: Achieved - 06/18/22  3.  Patient will lift her walker into and out of the car to improve her ability to perform community activity. Baseline: lifting rollator in/out of car  Goal Status:  Achieved - 06/18/22   PLAN: PT FREQUENCY: 2x/week  PT DURATION: 8 weeks  PLANNED INTERVENTIONS: Therapeutic exercises, Therapeutic activity, Neuromuscular re-education, Balance training, Gait training,  Patient/Family education, Self Care, Joint mobilization, Aquatic Therapy, Dry Needling, Electrical stimulation, Cryotherapy, Moist heat, Taping, and Manual therapy  PLAN FOR NEXT SESSION:  D/C today  PHYSICAL THERAPY DISCHARGE SUMMARY  Visits from Start of Care: 26  Current functional level related to goals / functional outcomes: Mild improvements in function with bilateral shoulder use.  Improved ability to ambulate.  Full pool program.    Remaining deficits: Continued difficulty ambulating this will likely be baseline   Education / Equipment: HEP  Patient agrees to discharge. Patient goals were met. Patient is being discharged due to maximized rehab potential.    Discharge performed by Carolyne Littles, PT, DPT 08/27/2022

## 2022-06-28 ENCOUNTER — Ambulatory Visit (HOSPITAL_BASED_OUTPATIENT_CLINIC_OR_DEPARTMENT_OTHER): Payer: Medicare Other | Admitting: Physical Therapy

## 2022-07-05 DIAGNOSIS — M25352 Other instability, left hip: Secondary | ICD-10-CM | POA: Diagnosis not present

## 2022-07-05 DIAGNOSIS — Z96642 Presence of left artificial hip joint: Secondary | ICD-10-CM | POA: Diagnosis not present

## 2022-07-05 DIAGNOSIS — Z96649 Presence of unspecified artificial hip joint: Secondary | ICD-10-CM | POA: Diagnosis not present

## 2022-07-08 DIAGNOSIS — E039 Hypothyroidism, unspecified: Secondary | ICD-10-CM | POA: Diagnosis not present

## 2022-07-23 DIAGNOSIS — U071 COVID-19: Secondary | ICD-10-CM | POA: Diagnosis not present

## 2022-08-11 DIAGNOSIS — F902 Attention-deficit hyperactivity disorder, combined type: Secondary | ICD-10-CM | POA: Diagnosis not present

## 2022-08-27 ENCOUNTER — Telehealth (HOSPITAL_BASED_OUTPATIENT_CLINIC_OR_DEPARTMENT_OTHER): Payer: Self-pay | Admitting: Physical Therapy

## 2022-08-27 NOTE — Telephone Encounter (Signed)
error 

## 2022-09-02 ENCOUNTER — Encounter: Payer: Self-pay | Admitting: Family Medicine

## 2022-09-02 ENCOUNTER — Other Ambulatory Visit: Payer: Self-pay | Admitting: Family Medicine

## 2022-09-02 ENCOUNTER — Encounter: Payer: Self-pay | Admitting: Orthopedic Surgery

## 2022-09-02 DIAGNOSIS — M5416 Radiculopathy, lumbar region: Secondary | ICD-10-CM

## 2022-09-03 ENCOUNTER — Other Ambulatory Visit: Payer: Self-pay | Admitting: Diagnostic Radiology

## 2022-09-03 DIAGNOSIS — M5416 Radiculopathy, lumbar region: Secondary | ICD-10-CM

## 2022-09-06 ENCOUNTER — Other Ambulatory Visit: Payer: Self-pay | Admitting: Diagnostic Radiology

## 2022-09-06 DIAGNOSIS — M5416 Radiculopathy, lumbar region: Secondary | ICD-10-CM

## 2022-09-07 ENCOUNTER — Other Ambulatory Visit: Payer: Self-pay | Admitting: Interventional Radiology

## 2022-09-07 DIAGNOSIS — M5416 Radiculopathy, lumbar region: Secondary | ICD-10-CM

## 2022-09-08 ENCOUNTER — Ambulatory Visit
Admission: RE | Admit: 2022-09-08 | Discharge: 2022-09-08 | Disposition: A | Discharge status: Home / Self Care | Payer: Medicare Other | Source: Ambulatory Visit | Attending: Family Medicine | Admitting: Family Medicine

## 2022-09-08 ENCOUNTER — Ambulatory Visit
Admission: RE | Admit: 2022-09-08 | Discharge: 2022-09-08 | Disposition: A | Discharge status: Home / Self Care | Payer: Medicare Other | Source: Ambulatory Visit | Attending: Diagnostic Radiology | Admitting: Diagnostic Radiology

## 2022-09-08 DIAGNOSIS — M5416 Radiculopathy, lumbar region: Secondary | ICD-10-CM

## 2022-09-08 MED ORDER — TRIAMCINOLONE ACETONIDE 40 MG/ML IJ SUSP (RADIOLOGY): 80.0000 mg | Freq: Once | INTRAMUSCULAR | Status: DC

## 2022-09-08 MED ORDER — IOPAMIDOL (ISOVUE-M 200) INJECTION 41%
1.0000 mL | Freq: Once | INTRAMUSCULAR | Status: AC
  Administered 2022-09-08: 1 mL via INTRA_ARTICULAR

## 2022-09-08 NOTE — Discharge Instructions (Signed)
Post Procedure Spinal Discharge Instruction Sheet  You may resume a regular diet and any medications that you routinely take (including pain medications) unless otherwise noted by MD.  No driving day of procedure.  Light activity throughout the rest of the day.  Do not do any strenuous work, exercise, bending or lifting.  The day following the procedure, you can resume normal physical activity but you should refrain from exercising or physical therapy for at least two days thereafter.  You may apply ice to the injection site, 20 minutes on, 20 minutes off, as needed. Do not apply ice directly to skin.    Common Side Effects:  Headaches- take your usual medications as directed by your physician.  Increase your fluid intake.  Caffeinated beverages may be helpful.  Lie flat in bed until your headache resolves.  Restlessness or inability to sleep- you may have trouble sleeping for the next few days.  Ask your referring physician if you need any medication for sleep.  Facial flushing or redness- should subside within a few days.  Increased pain- a temporary increase in pain a day or two following your procedure is not unusual.  Take your pain medication as prescribed by your referring physician.  Leg cramps  Please contact our office at [phone] for the following symptoms: Fever greater than 100 degrees. Headaches unresolved with medication after 2-3 days. Increased swelling, pain, or redness at injection site.   Thank you for visiting [facility] today.

## 2022-10-06 ENCOUNTER — Other Ambulatory Visit: Payer: Self-pay | Admitting: Physician Assistant

## 2022-10-06 DIAGNOSIS — M461 Sacroiliitis, not elsewhere classified: Secondary | ICD-10-CM

## 2022-10-11 DIAGNOSIS — Z96643 Presence of artificial hip joint, bilateral: Secondary | ICD-10-CM | POA: Diagnosis not present

## 2022-10-11 DIAGNOSIS — Z471 Aftercare following joint replacement surgery: Secondary | ICD-10-CM | POA: Diagnosis not present

## 2022-10-11 DIAGNOSIS — Z96642 Presence of left artificial hip joint: Secondary | ICD-10-CM | POA: Diagnosis not present

## 2022-10-14 DIAGNOSIS — E039 Hypothyroidism, unspecified: Secondary | ICD-10-CM | POA: Diagnosis not present

## 2022-10-14 DIAGNOSIS — Z13220 Encounter for screening for lipoid disorders: Secondary | ICD-10-CM | POA: Diagnosis not present

## 2022-10-14 DIAGNOSIS — G479 Sleep disorder, unspecified: Secondary | ICD-10-CM | POA: Diagnosis not present

## 2022-10-14 DIAGNOSIS — Z136 Encounter for screening for cardiovascular disorders: Secondary | ICD-10-CM | POA: Diagnosis not present

## 2022-10-14 DIAGNOSIS — M25511 Pain in right shoulder: Secondary | ICD-10-CM | POA: Diagnosis not present

## 2022-10-14 DIAGNOSIS — M810 Age-related osteoporosis without current pathological fracture: Secondary | ICD-10-CM | POA: Diagnosis not present

## 2022-10-14 DIAGNOSIS — M25559 Pain in unspecified hip: Secondary | ICD-10-CM | POA: Diagnosis not present

## 2022-10-14 DIAGNOSIS — F3342 Major depressive disorder, recurrent, in full remission: Secondary | ICD-10-CM | POA: Diagnosis not present

## 2022-10-14 DIAGNOSIS — L4050 Arthropathic psoriasis, unspecified: Secondary | ICD-10-CM | POA: Diagnosis not present

## 2022-10-14 DIAGNOSIS — M2550 Pain in unspecified joint: Secondary | ICD-10-CM | POA: Diagnosis not present

## 2022-10-14 DIAGNOSIS — R29898 Other symptoms and signs involving the musculoskeletal system: Secondary | ICD-10-CM | POA: Diagnosis not present

## 2022-10-14 DIAGNOSIS — I10 Essential (primary) hypertension: Secondary | ICD-10-CM | POA: Diagnosis not present

## 2022-10-14 DIAGNOSIS — I7300 Raynaud's syndrome without gangrene: Secondary | ICD-10-CM | POA: Diagnosis not present

## 2022-10-15 ENCOUNTER — Ambulatory Visit
Admission: RE | Admit: 2022-10-15 | Discharge: 2022-10-15 | Disposition: A | Discharge status: Home / Self Care | Payer: Medicare Other | Source: Ambulatory Visit | Attending: Physician Assistant | Admitting: Physician Assistant

## 2022-10-15 DIAGNOSIS — M461 Sacroiliitis, not elsewhere classified: Secondary | ICD-10-CM

## 2022-10-15 MED ORDER — TRIAMCINOLONE ACETONIDE 40 MG/ML IJ SUSP (RADIOLOGY): 40.0000 mg | Freq: Once | INTRAMUSCULAR | Status: DC

## 2022-10-18 DIAGNOSIS — H524 Presbyopia: Secondary | ICD-10-CM | POA: Diagnosis not present

## 2022-10-18 DIAGNOSIS — H2511 Age-related nuclear cataract, right eye: Secondary | ICD-10-CM | POA: Diagnosis not present

## 2022-10-18 DIAGNOSIS — H5212 Myopia, left eye: Secondary | ICD-10-CM | POA: Diagnosis not present

## 2022-11-04 ENCOUNTER — Ambulatory Visit (INDEPENDENT_AMBULATORY_CARE_PROVIDER_SITE_OTHER): Payer: Medicare Other | Admitting: Family Medicine

## 2022-11-04 ENCOUNTER — Other Ambulatory Visit: Payer: Self-pay

## 2022-11-04 VITALS — BP 128/82 | Ht 64.0 in | Wt 132.0 lb

## 2022-11-04 DIAGNOSIS — M25512 Pain in left shoulder: Secondary | ICD-10-CM | POA: Diagnosis not present

## 2022-11-04 MED ORDER — METHYLPREDNISOLONE ACETATE 40 MG/ML IJ SUSP
40.0000 mg | Freq: Once | INTRAMUSCULAR | Status: AC
  Administered 2022-11-04: 40 mg via INTRA_ARTICULAR

## 2022-11-04 NOTE — Patient Instructions (Signed)
We went ahead with a repeat injection into your left shoulder today. We can repeat these up to every 3 months if needed.

## 2022-11-05 NOTE — Progress Notes (Signed)
PCP: Gweneth Dimitri, MD  Subjective:   HPI: Patient is a 72 y.o. female here for left shoulder pain.  05/04/21: Reports minimal relief after left shoulder injection at last visit, but would like to discuss possible injection in left and right shoulder today. She continues to swim and finds shoulder pain worse with this activity. Left knee has only minimal swelling and much improved from last visit. She does not need it to be examined today.  11/04/22: Patient states injection last visit (glenohumeral) did provider her relief. Pain worsening again in left shoulder anteriorly and posteriorly. 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 Has to walk with a walker now. No radiation past elbow, numbness/tingling.  Past Medical History:  Diagnosis Date   Rheumatoid arthritis (HCC)     Current Outpatient Medications on File Prior to Visit  Medication Sig Dispense Refill   amoxicillin (AMOXIL) 500 MG capsule Take 500 mg by mouth 3 (three) times daily. (Patient not taking: Reported on 03/02/2022)     amphetamine-dextroamphetamine (ADDERALL) 20 MG tablet Take 20 mg by mouth See admin instructions. Take one tablet (20 mg) by mouth twice daily - breakfast and lunch     DULoxetine (CYMBALTA) 60 MG capsule Take 60 mg by mouth daily.     estradiol (ESTRACE) 1 MG tablet Take 1 mg by mouth daily.   0   gabapentin (NEURONTIN) 600 MG tablet Take 1,200 mg by mouth 3 (three) times daily.      levothyroxine (SYNTHROID, LEVOTHROID) 75 MCG tablet Take 1 tablet (75 mcg total) by mouth daily before breakfast. **PT NEEDS FOLLOW UP APPT** (Patient taking differently: Take 75 mcg by mouth daily before breakfast.) 45 tablet 0   LIDOCAINE EX Apply 1 application topically 2 (two) times daily as needed (shoulder pain).     meloxicam (MOBIC) 7.5 MG tablet Take 7.5 mg by mouth daily. (Patient not  taking: Reported on 03/02/2022)     methocarbamol (ROBAXIN) 750 MG tablet Take 1,500 mg by mouth 2 (two) times daily as needed (pain).      Multiple Vitamin (MULTIVITAMIN WITH MINERALS) TABS tablet Take 1 tablet by mouth daily.     NIFEdipine (PROCARDIA-XL/ADALAT-CC/NIFEDICAL-XL) 30 MG 24 hr tablet Take 30 mg by mouth daily.      nitroGLYCERIN (NITRODUR - DOSED IN MG/24 HR) 0.2 mg/hr patch Use 1/4 patch daily to the affected area. (Patient not taking: Reported on 08/11/2021) 30 patch 1   oxycodone (OXY-IR) 5 MG capsule Take 1 capsule (5 mg total) by mouth every 6 (six) hours as needed. 6 capsule 0   polyvinyl alcohol (LIQUIFILM TEARS) 1.4 % ophthalmic solution Place 1 drop into both eyes daily.     traMADol (ULTRAM) 50 MG tablet Take 50 mg by mouth 2 (two) times daily as needed (pain). for pain     No current facility-administered medications on file prior to visit.    Past Surgical History:  Procedure Laterality Date   BREAST BIOPSY Left    pt not sure of date.   HIP SURGERY     SHOULDER SURGERY      Allergies  Allergen Reactions   Ketamine Other (See Comments)    Hallucination reaction after received 100 mg of ketamine, combined with propofol 100 mg, for sedation.    BP 128/82   Ht 5\' 4"  (1.626 m)   Wt 132 lb (  59.9 kg)   BMI 22.66 kg/m      05/04/2021    2:07 PM  [facility] Adult [facility]  Frequency of aerobic exercise (# of days/week) 3  Average time in minutes 60  Frequency of strengthening activities (# of days/week) 0        No data to display              Objective:  Physical Exam:  Gen: NAD, comfortable in exam room  Left shoulder: No swelling, ecchymoses.  No gross deformity. No TTP biceps tendon or AC joint. ROM limited - abduction and flexion to 120 degrees actively and passively with crepitus/clunking. Positive Hawkins, negative Neers. Negative Yergasons. Strength 4/5 with empty can and resisted internal/external rotation. NV intact  distally.   Assessment & Plan:  1. Left shoulder pain - prior subacromial injection and exercises without much relief but did get benefit from glenohumeral injection - repeated this today with ultrasound guidance.  F/u prn - can repeat every 3 months if needed.  After informed written consent timeout was performed, patient was lying on right side on exam table. Left shoulder was prepped with alcohol swab and utilizing ultrasound guidance, patient's left glenohumeral space was injected with 3:1 lidocaine: depomedrol. Patient tolerated the procedure well without immediate complications.

## 2022-11-11 ENCOUNTER — Ambulatory Visit (HOSPITAL_BASED_OUTPATIENT_CLINIC_OR_DEPARTMENT_OTHER): Payer: Medicare Other | Attending: Orthopedic Surgery | Admitting: Physical Therapy

## 2022-11-11 DIAGNOSIS — M25552 Pain in left hip: Secondary | ICD-10-CM | POA: Insufficient documentation

## 2022-11-11 DIAGNOSIS — G8929 Other chronic pain: Secondary | ICD-10-CM | POA: Diagnosis not present

## 2022-11-11 DIAGNOSIS — M545 Low back pain, unspecified: Secondary | ICD-10-CM | POA: Insufficient documentation

## 2022-11-11 DIAGNOSIS — R2689 Other abnormalities of gait and mobility: Secondary | ICD-10-CM

## 2022-11-11 DIAGNOSIS — M25551 Pain in right hip: Secondary | ICD-10-CM | POA: Diagnosis not present

## 2022-11-11 DIAGNOSIS — M25511 Pain in right shoulder: Secondary | ICD-10-CM | POA: Insufficient documentation

## 2022-11-11 NOTE — Therapy (Signed)
OUTPATIENT PHYSICAL THERAPY LOWER EXTREMITY EVALUATION   Patient Name: Virginia Gordon MRN: [id number] DOB:Apr 08, 1951, 72 y.o., female Today's Date: 11/12/2022  END OF SESSION:  PT End of Session - 11/12/22 0627     Visit Number 1    Number of Visits 16    Date for PT Re-Evaluation 01/07/23    Authorization Type MCR [employer]    PT Start Time 1300    PT Stop Time 1343    PT Time Calculation (min) 43 min    Activity Tolerance Patient tolerated treatment well    Behavior During Therapy WFL for tasks assessed/performed             Past Medical History:  Diagnosis Date   Rheumatoid arthritis    Past Surgical History:  Procedure Laterality Date   BREAST BIOPSY Left    pt not sure of date.   HIP SURGERY     SHOULDER SURGERY     Patient Active Problem List   Diagnosis Date Noted   Effusion of left knee joint 08/17/2021   ADD (attention deficit disorder) 11/13/2015   Connective tissue disease overlap syndrome 11/13/2015   Menopausal and perimenopausal disorder 11/13/2015   Acne erythematosa 11/13/2015   Disordered sleep 11/13/2015   History of operative procedure on hip 07/03/2015   Left knee pain 05/20/2014   Right foot pain 05/14/2014   Bilateral shoulder pain 05/14/2014   Hypothyroidism 03/08/2014   Degenerative arthritis of hip 03/07/2012    PCP: Maple Hudson MD   REFERRING PROVIDER: Dr Nancy Fetter  REFERRING DIAG: [phone] (ICD-10-CM) - Presence of left artificial hip joint   THERAPY DIAG:  Chronic right shoulder pain  Chronic bilateral low back pain without sciatica  Pain in right hip  Pain in left hip  Other abnormalities of gait and mobility  Rationale for Evaluation and Treatment: Rehabilitation  ONSET DATE:   SUBJECTIVE: 4/24 was the original revision surgery. long history of hip dislocations   SUBJECTIVE STATEMENT: The patient returns to therapy following visit with her MD. He hads cleared her for gait training. The patient reports the only  precaution is no crossing the legs. She has been having increased right shoulder and right knee pain. Per the patient she needs a knee replacement on the right. She has been trying to swim but has been limited. She had physical therapy in [place] for her shoulders. Following she was able to swim for a period of time. At this time she feels like she is unable to. She hopes to improve her ability to ambulate.   PERTINENT HISTORY: Right shoulder pain; right knee pain; Right Knee OA ; RA; connection tissue disease overlap syndrome. Charcot Foot   PAIN:  Are you having pain? Yes: NPRS scale: 8/10 Pain location: left pelvis area  Pain description: aching  Aggravating factors: swimming; waking without support   Relieving factors: not doing things that   Are you having pain? Yes: NPRS scale: 8-9/10 at worst  Pain location: right shoulder  Pain description: aching  Aggravating factors: swimming; = Relieving factors: not doing things that   Are you having pain? Yes: NPRS scale: 8-9/10 at worst 2-3  Pain location: right shoulder  Pain description: aching  Aggravating factors: swimming; waking without support   Relieving factors: not doing things that     PRECAUTIONS: Other: hip : do not cross baseline   WEIGHT BEARING RESTRICTIONS: No  FALLS:  Has patient fallen in last 6 months? No  LIVING ENVIRONMENT: 2 steps in the  house and 2 spots around the house in various areas  OCCUPATION:  Retired   PLOF: Independent with community mobility with device  PATIENT GOALS: to walk better   NEXT MD VISIT: 6 months   OBJECTIVE:   DIAGNOSTIC FINDINGS: Post op: Hip intact per patient   PATIENT SURVEYS:   COGNITION: Overall cognitive status: Within functional limits for tasks assessed     SENSATION: Denies paresthesias   EDEMA:  None  MUSCLE LENGTH:   POSTURE: flexed posture left knee valgus  PALPATION:   LOWER EXTREMITY ROM:  Active ROM Right eval Left eval  Hip flexion   WNL   Hip extension    Hip abduction    Hip adduction    Hip internal rotation    Hip external rotation    Knee flexion  Painful end range   Knee extension    Ankle dorsiflexion    Ankle plantarflexion    Ankle inversion    Ankle eversion     (Blank rows = not tested)  LOWER EXTREMITY MMT:  MMT Right eval Left eval  Hip flexion 13.9 10  Hip extension    Hip abduction 16.8 11.4  Hip adduction    Hip internal rotation    Hip external rotation    Knee flexion    Knee extension 21.7 19  Ankle dorsiflexion    Ankle plantarflexion    Ankle inversion    Ankle eversion     (Blank rows = not tested)  ER/IR WFL Right 90 Shoulder flexion Left 120 shoulder flexion     FUNCTIONAL TESTS:  Will perform 5x sit tostand test next visit.  Sit to stand: uses hands   GAIT: Signifcant left trendelenburgh gait   TODAY'S TREATMENT:                                                                                                                              DATE:   Access Code: [email or web address] URL: [email or web address] Date: 11/12/2022 Prepared by: Lorayne Bender  Exercises - Standing Hip Abduction with Counter Support  - 1 x daily - 7 x weekly - 3 sets - 10 reps - Standing Hip Extension with Counter Support  - 1 x daily - 7 x weekly - 3 sets - 10 reps - Standing Hip Flexion AROM  - 1 x daily - 7 x weekly - 3 sets - 10 reps - Shoulder External Rotation and Scapular Retraction with Resistance  - 1 x daily - 7 x weekly - 3 sets - 10 reps - Seated Shoulder Horizontal Abduction with Resistance  - 1 x daily - 7 x weekly - 3 sets - 10 reps  PATIENT EDUCATION:  Education details: HEP, symptom management  Person educated: Patient Education method: Explanation, Demonstration, Tactile cues, Verbal cues, and Handouts Education comprehension: verbalized understanding, returned demonstration, verbal cues required, tactile cues required, and needs further education  HOME EXERCISE  PROGRAM: Access Code: [email or web address] URL: [email or web address] Date: 11/11/2022  Prepared by: Lorayne Benderavid [place] Korver  Exercises - Standing Hip Abduction with Counter Support  - 1 x daily - 7 x weekly - 3 sets - 10 reps - Standing Hip Extension with Counter Support  - 1 x daily - 7 x weekly - 3 sets - 10 reps - Standing Hip Flexion AROM  - 1 x daily - 7 x weekly - 3 sets - 10 reps - Shoulder External Rotation and Scapular Retraction with Resistance  - 1 x daily - 7 x weekly - 3 sets - 10 reps - Seated Shoulder Horizontal Abduction with Resistance  - 1 x daily - 7 x weekly - 3 sets - 10 reps  ASSESSMENT:  CLINICAL IMPRESSION: Patient is a 72 year old female who presents following left hip revision in April 2023.  She has a long history of hip dislocations.  Directly following this latest revision and cage placement she had a dislocation.  The MD at this time has cleared her to begin gait training.  He is advised against crossing midline with her hip.  The patient presents with expected limitations in strength and general mobility.  She has limited hip abduction strength.  At this time it is questionable whether this is her baseline.  Will give her a light land hip strengthening and core stability program.  We will also work on gait training in the pool.  She would benefit from skilled therapy to reach max potential for ambulation and improve her ability to swim.  OBJECTIVE IMPAIRMENTS: Abnormal gait, decreased activity tolerance, decreased balance, decreased endurance, decreased knowledge of use of DME, decreased mobility, difficulty walking, decreased ROM, decreased strength, increased fascial restrictions, increased muscle spasms, postural dysfunction, and pain.   ACTIVITY LIMITATIONS: carrying, lifting, bending, standing, squatting, stairs, transfers, and locomotion level  PARTICIPATION LIMITATIONS: meal prep, cleaning, laundry, driving, shopping, community activity, and yard work  PERSONAL  FACTORS: 3+ comorbidities: RA ; multiple hip dislocations; right knee OA; bilateral RTC tears   are also affecting patient's functional outcome.   REHAB POTENTIAL: Good  CLINICAL DECISION MAKING: Evolving/moderate complexity  EVALUATION COMPLEXITY: Moderate   GOALS: Goals reviewed with patient? Yes   SHORT TERM GOALS: Target date: 12/09/2022   Patient will be independent with basic HEP for hip strengthening with no pain or instability  Baseline: Goal status: INITIAL  2.  Patient will increase gross bilateral hip strength by 5 pounds Baseline:  Goal status: INITIAL  3.  Patient will ambulate 500 feet with upright standing posture and minimal pain with least restrictive assistive device Baseline:  Goal status: INITIAL   LONG TERM GOALS: Target date: 01/07/2023    Patient will return to swimming with minimal pain Baseline:  Goal status: INITIAL  2.  Patient will ambulate community distances with least restrictive assistive device and good posture Baseline:  Goal status: INITIAL  3.  Patient will be independent with complete program for core stability, posture, and to maximize potential to increase gait distance Baseline:  Goal status: INITIAL    PLAN:  PT FREQUENCY: 2x/week  PT DURATION: 8 weeks  PLANNED INTERVENTIONS: Therapeutic exercises, Therapeutic activity, Neuromuscular re-education, Balance training, Gait training, Patient/Family education, Self Care, Joint mobilization (of the shoulder), Stair training, Aquatic Therapy, Dry Needling, Cryotherapy, Moist heat, Taping, and Manual therapy  PLAN FOR NEXT SESSION: Aquatics: Work on gait stability, bilateral hip strengthening, core stability Land: Develop base HEP patient has pain lying supine in her back focus more on standing exercises if tolerated.  Get patient on exercise bike and/or  Nu[employer] for self guided gym program   Dessie Coma, PT 11/12/2022, 6:39 AM

## 2022-11-12 ENCOUNTER — Encounter (HOSPITAL_BASED_OUTPATIENT_CLINIC_OR_DEPARTMENT_OTHER): Payer: Self-pay | Admitting: Physical Therapy

## 2022-11-12 ENCOUNTER — Other Ambulatory Visit: Payer: Self-pay

## 2022-11-15 ENCOUNTER — Ambulatory Visit (HOSPITAL_BASED_OUTPATIENT_CLINIC_OR_DEPARTMENT_OTHER): Payer: Medicare Other | Admitting: Physical Therapy

## 2022-11-15 ENCOUNTER — Encounter (HOSPITAL_BASED_OUTPATIENT_CLINIC_OR_DEPARTMENT_OTHER): Payer: Self-pay | Admitting: Physical Therapy

## 2022-11-15 DIAGNOSIS — M25552 Pain in left hip: Secondary | ICD-10-CM

## 2022-11-15 DIAGNOSIS — M25511 Pain in right shoulder: Secondary | ICD-10-CM | POA: Diagnosis not present

## 2022-11-15 DIAGNOSIS — G8929 Other chronic pain: Secondary | ICD-10-CM | POA: Diagnosis not present

## 2022-11-15 DIAGNOSIS — M545 Low back pain, unspecified: Secondary | ICD-10-CM | POA: Diagnosis not present

## 2022-11-15 DIAGNOSIS — R2689 Other abnormalities of gait and mobility: Secondary | ICD-10-CM | POA: Diagnosis not present

## 2022-11-15 DIAGNOSIS — M25551 Pain in right hip: Secondary | ICD-10-CM

## 2022-11-15 NOTE — Therapy (Signed)
OUTPATIENT PHYSICAL THERAPY LOWER EXTREMITY EVALUATION   Patient Name: Virginia Gordon MRN: [id number] DOB:1950/11/16, 72 y.o., female Today's Date: 11/15/2022  END OF SESSION:  PT End of Session - 11/15/22 1127     Visit Number 2    Number of Visits 16    Date for PT Re-Evaluation 01/07/23    Authorization Type MCR USAA    PT Start Time 1037   Pt arrives late   PT Stop Time 1115    PT Time Calculation (min) 38 min    Activity Tolerance Patient tolerated treatment well    Behavior During Therapy WFL for tasks assessed/performed             Past Medical History:  Diagnosis Date   Rheumatoid arthritis    Past Surgical History:  Procedure Laterality Date   BREAST BIOPSY Left    pt not sure of date.   HIP SURGERY     SHOULDER SURGERY     Patient Active Problem List   Diagnosis Date Noted   Effusion of left knee joint 08/17/2021   ADD (attention deficit disorder) 11/13/2015   Connective tissue disease overlap syndrome 11/13/2015   Menopausal and perimenopausal disorder 11/13/2015   Acne erythematosa 11/13/2015   Disordered sleep 11/13/2015   History of operative procedure on hip 07/03/2015   Left knee pain 05/20/2014   Right foot pain 05/14/2014   Bilateral shoulder pain 05/14/2014   Hypothyroidism 03/08/2014   Degenerative arthritis of hip 03/07/2012    PCP: Maple Hudson MD   REFERRING PROVIDER: Dr Nancy Fetter  REFERRING DIAG: [phone] (ICD-10-CM) - Presence of left artificial hip joint   THERAPY DIAG:  Chronic right shoulder pain  Chronic bilateral low back pain without sciatica  Pain in right hip  Pain in left hip  Rationale for Evaluation and Treatment: Rehabilitation  ONSET DATE:   SUBJECTIVE: 4/24 was the original revision surgery. long history of hip dislocations   SUBJECTIVE STATEMENT: Pt reports continued compliance with previous aquatic HEP.  Is ready to learn new exercises  The patient returns to therapy following visit with her MD. He  hads cleared her for gait training. The patient reports the only precaution is no crossing the legs. She has been having increased right shoulder and right knee pain. Per the patient she needs a knee replacement on the right. She has been trying to swim but has been limited. She had physical therapy in [place] for her shoulders. Following she was able to swim for a period of time. At this time she feels like she is unable to. She hopes to improve her ability to ambulate.   PERTINENT HISTORY: Right shoulder pain; right knee pain; Right Knee OA ; RA; connection tissue disease overlap syndrome. Charcot Foot   PAIN:  Are you having pain? Yes: NPRS scale: 8/10 Pain location: left pelvis area  Pain description: aching  Aggravating factors: swimming; waking without support   Relieving factors: not doing things that   Are you having pain? Yes: NPRS scale: 8-9/10 at worst  Pain location: right shoulder  Pain description: aching  Aggravating factors: swimming; = Relieving factors: not doing things that   Are you having pain? Yes: NPRS scale: 8-9/10 at worst 2-3  Pain location: right shoulder  Pain description: aching  Aggravating factors: swimming; waking without support   Relieving factors: not doing things that     PRECAUTIONS: Other: hip : do not cross baseline   WEIGHT BEARING RESTRICTIONS: No  FALLS:  Has patient  fallen in last 6 months? No  LIVING ENVIRONMENT: 2 steps in the house and 2 spots around the house in various areas  OCCUPATION:  Retired   PLOF: Independent with community mobility with device  PATIENT GOALS: to walk better   NEXT MD VISIT: 6 months   OBJECTIVE:   DIAGNOSTIC FINDINGS: Post op: Hip intact per patient   PATIENT SURVEYS:   COGNITION: Overall cognitive status: Within functional limits for tasks assessed     SENSATION: Denies paresthesias   EDEMA:  None  MUSCLE LENGTH:   POSTURE: flexed posture left knee valgus  PALPATION:   LOWER  EXTREMITY ROM:  Active ROM Right eval Left eval  Hip flexion  WNL   Hip extension    Hip abduction    Hip adduction    Hip internal rotation    Hip external rotation    Knee flexion  Painful end range   Knee extension    Ankle dorsiflexion    Ankle plantarflexion    Ankle inversion    Ankle eversion     (Blank rows = not tested)  LOWER EXTREMITY MMT:  MMT Right eval Left eval  Hip flexion 13.9 10  Hip extension    Hip abduction 16.8 11.4  Hip adduction    Hip internal rotation    Hip external rotation    Knee flexion    Knee extension 21.7 19  Ankle dorsiflexion    Ankle plantarflexion    Ankle inversion    Ankle eversion     (Blank rows = not tested)  ER/IR WFL Right 90 Shoulder flexion Left 120 shoulder flexion     FUNCTIONAL TESTS:  Will perform 5x sit tostand test next visit.  Sit to stand: uses hands   GAIT: Signifcant left trendelenburgh gait   TODAY'S TREATMENT:                                                                                                                              Pt seen for aquatic therapy today.  Treatment took place in water 3.5-4.75 ft in depth at the [facility] pool. Temp of water was 91.  Pt entered/exited the pool via stairs using step to patter with bilat hand rail.  *walking forward back and side stepping *sup suspension using nek doodle and noodles: donned left ankle foam. Knee flex with eccentric control then hip extension with eccentric control *Standing for core engagement: using green hand bells ue oscillations in frontal and sagittal planes wide stance then staggered rle forward only. Allowed time to gain motor plan/balance and coordinate movement. The quick - slow oscillations 5-10  reps 2-3 sets as tolerated in each lane with ea stance.  Pt requires the buoyancy and hydrostatic pressure of water for support, and to offload joints by unweighting joint load by at least 50 % in navel deep water and by at  least 75-80% in chest to neck deep water.  Viscosity of the  water is needed for resistance of strengthening. Water current perturbations provides challenge to standing balance requiring increased core activation.     PATIENT EDUCATION:  Education details: HEP, symptom management  Person educated: Patient Education method: Explanation, Demonstration, Tactile cues, Verbal cues, and Handouts Education comprehension: verbalized understanding, returned demonstration, verbal cues required, tactile cues required, and needs further education  HOME EXERCISE PROGRAM: Access Code: [email or web address] URL: [email or web address] Date: 11/11/2022 Prepared by: Lorayne Bender  Exercises - Standing Hip Abduction with Counter Support  - 1 x daily - 7 x weekly - 3 sets - 10 reps - Standing Hip Extension with Counter Support  - 1 x daily - 7 x weekly - 3 sets - 10 reps - Standing Hip Flexion AROM  - 1 x daily - 7 x weekly - 3 sets - 10 reps - Shoulder External Rotation and Scapular Retraction with Resistance  - 1 x daily - 7 x weekly - 3 sets - 10 reps - Seated Shoulder Horizontal Abduction with Resistance  - 1 x daily - 7 x weekly - 3 sets - 10 reps  ASSESSMENT:  CLINICAL IMPRESSION: Pt ind and safe in setting. Known pt her in aquatics. She is directed through posterior core strengthening as well as other core engagement using hand bells. She does not tolerate staggered stance with lle forward position due to pelvic pain so discontinued. Modified sagittal plane rue movements to decrease any discomfort in left shoulder. She requires frequent vc and demonstration for proper execution. She will benefit from a few more aquatic sessions to ensure indep with aquatic HEP. Exercises to be added to previous program  Patient is a 72 year old female who presents following left hip revision in April 2023.  She has a long history of hip dislocations.  Directly following this latest revision and cage placement she had  a dislocation.  The MD at this time has cleared her to begin gait training.  He is advised against crossing midline with her hip.  The patient presents with expected limitations in strength and general mobility.  She has limited hip abduction strength.  At this time it is questionable whether this is her baseline.  Will give her a light land hip strengthening and core stability program.  We will also work on gait training in the pool.  She would benefit from skilled therapy to reach max potential for ambulation and improve her ability to swim.  OBJECTIVE IMPAIRMENTS: Abnormal gait, decreased activity tolerance, decreased balance, decreased endurance, decreased knowledge of use of DME, decreased mobility, difficulty walking, decreased ROM, decreased strength, increased fascial restrictions, increased muscle spasms, postural dysfunction, and pain.   ACTIVITY LIMITATIONS: carrying, lifting, bending, standing, squatting, stairs, transfers, and locomotion level  PARTICIPATION LIMITATIONS: meal prep, cleaning, laundry, driving, shopping, community activity, and yard work  PERSONAL FACTORS: 3+ comorbidities: RA ; multiple hip dislocations; right knee OA; bilateral RTC tears   are also affecting patient's functional outcome.   REHAB POTENTIAL: Good  CLINICAL DECISION MAKING: Evolving/moderate complexity  EVALUATION COMPLEXITY: Moderate   GOALS: Goals reviewed with patient? Yes   SHORT TERM GOALS: Target date: 12/09/2022   Patient will be independent with basic HEP for hip strengthening with no pain or instability  Baseline: Goal status: INITIAL  2.  Patient will increase gross bilateral hip strength by 5 pounds Baseline:  Goal status: INITIAL  3.  Patient will ambulate 500 feet with upright standing posture and minimal pain with least restrictive assistive device Baseline:  Goal status: INITIAL  LONG TERM GOALS: Target date: 01/07/2023    Patient will return to swimming with minimal  pain Baseline:  Goal status: INITIAL  2.  Patient will ambulate community distances with least restrictive assistive device and good posture Baseline:  Goal status: INITIAL  3.  Patient will be independent with complete program for core stability, posture, and to maximize potential to increase gait distance Baseline:  Goal status: INITIAL    PLAN:  PT FREQUENCY: 2x/week  PT DURATION: 8 weeks  PLANNED INTERVENTIONS: Therapeutic exercises, Therapeutic activity, Neuromuscular re-education, Balance training, Gait training, Patient/Family education, Self Care, Joint mobilization (of the shoulder), Stair training, Aquatic Therapy, Dry Needling, Cryotherapy, Moist heat, Taping, and Manual therapy  PLAN FOR NEXT SESSION: Aquatics: Work on gait stability, bilateral hip strengthening, core stability Land: Develop base HEP patient has pain lying supine in her back focus more on standing exercises if tolerated.  Get patient on exercise bike and/or NuStep for self guided gym program   Geni Bers, PT 11/15/2022, 11:32 AM

## 2022-11-24 ENCOUNTER — Ambulatory Visit (HOSPITAL_BASED_OUTPATIENT_CLINIC_OR_DEPARTMENT_OTHER): Payer: Medicare Other | Admitting: Physical Therapy

## 2022-11-24 ENCOUNTER — Encounter (HOSPITAL_BASED_OUTPATIENT_CLINIC_OR_DEPARTMENT_OTHER): Payer: Self-pay | Admitting: Physical Therapy

## 2022-11-24 DIAGNOSIS — R2689 Other abnormalities of gait and mobility: Secondary | ICD-10-CM

## 2022-11-24 DIAGNOSIS — M25511 Pain in right shoulder: Secondary | ICD-10-CM | POA: Diagnosis not present

## 2022-11-24 DIAGNOSIS — M25552 Pain in left hip: Secondary | ICD-10-CM

## 2022-11-24 DIAGNOSIS — M545 Low back pain, unspecified: Secondary | ICD-10-CM | POA: Diagnosis not present

## 2022-11-24 DIAGNOSIS — G8929 Other chronic pain: Secondary | ICD-10-CM

## 2022-11-24 DIAGNOSIS — M25551 Pain in right hip: Secondary | ICD-10-CM

## 2022-11-24 NOTE — Therapy (Unsigned)
OUTPATIENT PHYSICAL THERAPY LOWER EXTREMITY EVALUATION   Patient Name: Virginia Gordon MRN: [id number] DOB:05-09-1951, 72 y.o., female Today's Date: 11/25/2022  END OF SESSION:  PT End of Session - 11/24/22 1336     Visit Number 3    Number of Visits 16    Date for PT Re-Evaluation 01/07/23    Authorization Type MCR USAA    PT Start Time 1100    PT Stop Time 1143    PT Time Calculation (min) 43 min    Activity Tolerance Patient tolerated treatment well    Behavior During Therapy WFL for tasks assessed/performed             Past Medical History:  Diagnosis Date   Rheumatoid arthritis    Past Surgical History:  Procedure Laterality Date   BREAST BIOPSY Left    pt not sure of date.   HIP SURGERY     SHOULDER SURGERY     Patient Active Problem List   Diagnosis Date Noted   Effusion of left knee joint 08/17/2021   ADD (attention deficit disorder) 11/13/2015   Connective tissue disease overlap syndrome 11/13/2015   Menopausal and perimenopausal disorder 11/13/2015   Acne erythematosa 11/13/2015   Disordered sleep 11/13/2015   History of operative procedure on hip 07/03/2015   Left knee pain 05/20/2014   Right foot pain 05/14/2014   Bilateral shoulder pain 05/14/2014   Hypothyroidism 03/08/2014   Degenerative arthritis of hip 03/07/2012    PCP: Maple Hudson MD   REFERRING PROVIDER: Dr Nancy Fetter  REFERRING DIAG: [id number] (ICD-10-CM) - Presence of left artificial hip joint   THERAPY DIAG:  Chronic right shoulder pain  Chronic bilateral low back pain without sciatica  Pain in right hip  Pain in left hip  Other abnormalities of gait and mobility  Rationale for Evaluation and Treatment: Rehabilitation  ONSET DATE:   SUBJECTIVE: 4/24 was the original revision surgery. long history of hip dislocations   SUBJECTIVE STATEMENT: The patient fell at home on the left hip yesterday. She reports more soreness on the inside of her thigh. She feels like overall  everything is OK . We will Assess. She reached for her walker and is not sure if it might have moved. She fell directly on her left hip.   PERTINENT HISTORY: Right shoulder pain; right knee pain; Right Knee OA ; RA; connection tissue disease overlap syndrome. Charcot Foot   PAIN:  Are you having pain? Yes: NPRS scale: 8/10 Pain location: left pelvis area  Pain description: aching  Aggravating factors: swimming; waking without support   Relieving factors: not doing things that   Are you having pain? Yes: NPRS scale: 8-9/10 at worst  Pain location: right shoulder  Pain description: aching  Aggravating factors: swimming; = Relieving factors: not doing things that   Are you having pain? Yes: NPRS scale: 8-9/10 at worst 2-3  Pain location: right shoulder  Pain description: aching  Aggravating factors: swimming; waking without support   Relieving factors: not doing things that     PRECAUTIONS: Other: hip : do not cross baseline   WEIGHT BEARING RESTRICTIONS: No  FALLS:  Has patient fallen in last 6 months? No  LIVING ENVIRONMENT: 2 steps in the house and 2 spots around the house in various areas  OCCUPATION:  Retired   PLOF: Independent with community mobility with device  PATIENT GOALS: to walk better   NEXT MD VISIT: 6 months   OBJECTIVE:   DIAGNOSTIC FINDINGS: Post op: Hip  intact per patient   PATIENT SURVEYS:   COGNITION: Overall cognitive status: Within functional limits for tasks assessed     SENSATION: Denies paresthesias   EDEMA:  None  MUSCLE LENGTH:   POSTURE: flexed posture left knee valgus  PALPATION:   LOWER EXTREMITY ROM:  Active ROM Right eval Left eval  Hip flexion  WNL   Hip extension    Hip abduction    Hip adduction    Hip internal rotation    Hip external rotation    Knee flexion  Painful end range   Knee extension    Ankle dorsiflexion    Ankle plantarflexion    Ankle inversion    Ankle eversion     (Blank rows = not  tested)  LOWER EXTREMITY MMT:  MMT Right eval Left eval  Hip flexion 13.9 10  Hip extension    Hip abduction 16.8 11.4  Hip adduction    Hip internal rotation    Hip external rotation    Knee flexion    Knee extension 21.7 19  Ankle dorsiflexion    Ankle plantarflexion    Ankle inversion    Ankle eversion     (Blank rows = not tested)  ER/IR WFL Right 90 Shoulder flexion Left 120 shoulder flexion     FUNCTIONAL TESTS:  Will perform 5x sit tostand test next visit.  Sit to stand: uses hands   GAIT: Signifcant left trendelenburgh gait   TODAY'S TREATMENT:                                                                                                                              DATE:    4/24  Quad sets 2x10 each SAQ 3x10 2lbs Abduction hip isometrics 3x15 sec hold LAQ 3x10 2lbs Standing weight shift 2x10  Air-ex narrow base EO and EC 2x30 sec each   Tandem stance L and right 2x30 sec each with eyes closed min a with right leg back   Eval  Exercises - Standing Hip Abduction with Counter Support  - 1 x daily - 7 x weekly - 3 sets - 10 reps - Standing Hip Extension with Counter Support  - 1 x daily - 7 x weekly - 3 sets - 10 reps - Standing Hip Flexion AROM  - 1 x daily - 7 x weekly - 3 sets - 10 reps - Shoulder External Rotation and Scapular Retraction with Resistance  - 1 x daily - 7 x weekly - 3 sets - 10 reps - Seated Shoulder Horizontal Abduction with Resistance  - 1 x daily - 7 x weekly - 3 sets - 10 reps  PATIENT EDUCATION:  Education details: HEP, symptom management  Person educated: Patient Education method: Explanation, Demonstration, Tactile cues, Verbal cues, and Handouts Education comprehension: verbalized understanding, returned demonstration, verbal cues required, tactile cues required, and needs further education  HOME EXERCISE PROGRAM: Access Code: [email or web address] URL: [email or web address] Date: 11/11/2022 Prepared by: Onalee Hua  Trevian Hayashida  Exercises - Standing Hip Abduction with Counter Support  - 1 x daily - 7 x weekly - 3 sets - 10 reps - Standing Hip Extension with Counter Support  - 1 x daily - 7 x weekly - 3 sets - 10 reps - Standing Hip Flexion AROM  - 1 x daily - 7 x weekly - 3 sets - 10 reps - Shoulder External Rotation and Scapular Retraction with Resistance  - 1 x daily - 7 x weekly - 3 sets - 10 reps - Seated Shoulder Horizontal Abduction with Resistance  - 1 x daily - 7 x weekly - 3 sets - 10 reps  ASSESSMENT:  CLINICAL IMPRESSION: Therapy assessed patients hip following the fall. She had her ROM from before with no signs of dislocation. She is not having any pain that is different then the pain she usually has. She was advised if she feels any buckling or like it is dislocating to make sure she goes to the ED or calls her MD. We focused on quad strengthening. She performed abduction isometrics to limit her range. We also focused on balance and stability on the air-ex. She was challenged with her eyes closed. We will continue to progress as tolerated.   OBJECTIVE IMPAIRMENTS: Abnormal gait, decreased activity tolerance, decreased balance, decreased endurance, decreased knowledge of use of DME, decreased mobility, difficulty walking, decreased ROM, decreased strength, increased fascial restrictions, increased muscle spasms, postural dysfunction, and pain.   ACTIVITY LIMITATIONS: carrying, lifting, bending, standing, squatting, stairs, transfers, and locomotion level  PARTICIPATION LIMITATIONS: meal prep, cleaning, laundry, driving, shopping, community activity, and yard work  PERSONAL FACTORS: 3+ comorbidities: RA ; multiple hip dislocations; right knee OA; bilateral RTC tears   are also affecting patient's functional outcome.   REHAB POTENTIAL: Good  CLINICAL DECISION MAKING: Evolving/moderate complexity  EVALUATION COMPLEXITY: Moderate   GOALS: Goals reviewed with patient? Yes   SHORT TERM GOALS:  Target date: 12/09/2022   Patient will be independent with basic HEP for hip strengthening with no pain or instability  Baseline: Goal status: INITIAL  2.  Patient will increase gross bilateral hip strength by 5 pounds Baseline:  Goal status: INITIAL  3.  Patient will ambulate 500 feet with upright standing posture and minimal pain with least restrictive assistive device Baseline:  Goal status: INITIAL   LONG TERM GOALS: Target date: 01/07/2023    Patient will return to swimming with minimal pain Baseline:  Goal status: INITIAL  2.  Patient will ambulate community distances with least restrictive assistive device and good posture Baseline:  Goal status: INITIAL  3.  Patient will be independent with complete program for core stability, posture, and to maximize potential to increase gait distance Baseline:  Goal status: INITIAL    PLAN:  PT FREQUENCY: 2x/week  PT DURATION: 8 weeks  PLANNED INTERVENTIONS: Therapeutic exercises, Therapeutic activity, Neuromuscular re-education, Balance training, Gait training, Patient/Family education, Self Care, Joint mobilization (of the shoulder), Stair training, Aquatic Therapy, Dry Needling, Cryotherapy, Moist heat, Taping, and Manual therapy  PLAN FOR NEXT SESSION: Aquatics: Work on gait stability, bilateral hip strengthening, core stability Land: Develop base HEP patient has pain lying supine in her back focus more on standing exercises if tolerated.  Get patient on exercise bike and/or NuStep for self guided gym program   Dessie Coma, PT 11/25/2022, 8:16 AM

## 2022-11-29 ENCOUNTER — Encounter (HOSPITAL_BASED_OUTPATIENT_CLINIC_OR_DEPARTMENT_OTHER): Payer: Self-pay | Admitting: Physical Therapy

## 2022-11-29 ENCOUNTER — Ambulatory Visit (HOSPITAL_BASED_OUTPATIENT_CLINIC_OR_DEPARTMENT_OTHER): Payer: Medicare Other | Admitting: Physical Therapy

## 2022-11-29 DIAGNOSIS — M25552 Pain in left hip: Secondary | ICD-10-CM

## 2022-11-29 DIAGNOSIS — M545 Low back pain, unspecified: Secondary | ICD-10-CM | POA: Diagnosis not present

## 2022-11-29 DIAGNOSIS — R2689 Other abnormalities of gait and mobility: Secondary | ICD-10-CM | POA: Diagnosis not present

## 2022-11-29 DIAGNOSIS — M25551 Pain in right hip: Secondary | ICD-10-CM | POA: Diagnosis not present

## 2022-11-29 DIAGNOSIS — G8929 Other chronic pain: Secondary | ICD-10-CM | POA: Diagnosis not present

## 2022-11-29 DIAGNOSIS — M25511 Pain in right shoulder: Secondary | ICD-10-CM | POA: Diagnosis not present

## 2022-11-29 NOTE — Therapy (Signed)
OUTPATIENT PHYSICAL THERAPY LOWER EXTREMITY EVALUATION   Patient Name: Virginia Gordon MRN: [id number] DOB:August 05, 1950, 72 y.o., female Today's Date: 11/29/2022  END OF SESSION:  PT End of Session - 11/29/22 1209     Visit Number 4    Number of Visits 16    Date for PT Re-Evaluation 01/07/23    Authorization Type MCR [employer]    PT Start Time 1145    PT Stop Time 1227    PT Time Calculation (min) 42 min    Activity Tolerance Patient tolerated treatment well    Behavior During Therapy WFL for tasks assessed/performed             Past Medical History:  Diagnosis Date   Rheumatoid arthritis (HCC)    Past Surgical History:  Procedure Laterality Date   BREAST BIOPSY Left    pt not sure of date.   HIP SURGERY     SHOULDER SURGERY     Patient Active Problem List   Diagnosis Date Noted   Effusion of left knee joint 08/17/2021   ADD (attention deficit disorder) 11/13/2015   Connective tissue disease overlap syndrome (HCC) 11/13/2015   Menopausal and perimenopausal disorder 11/13/2015   Acne erythematosa 11/13/2015   Disordered sleep 11/13/2015   History of operative procedure on hip 07/03/2015   Left knee pain 05/20/2014   Right foot pain 05/14/2014   Bilateral shoulder pain 05/14/2014   Hypothyroidism 03/08/2014   Degenerative arthritis of hip 03/07/2012    PCP: Maple Hudson MD   REFERRING PROVIDER: Dr Nancy Fetter  REFERRING DIAG: [phone] (ICD-10-CM) - Presence of left artificial hip joint   THERAPY DIAG:  Chronic right shoulder pain  Chronic bilateral low back pain without sciatica  Pain in right hip  Pain in left hip  Other abnormalities of gait and mobility  Rationale for Evaluation and Treatment: Rehabilitation  ONSET DATE:   SUBJECTIVE: 4/24 was the original revision surgery. long history of hip dislocations   SUBJECTIVE STATEMENT: The patient had no issues following her fall. She feels like her hip is about the same. She has had no major changes.     PERTINENT HISTORY: Right shoulder pain; right knee pain; Right Knee OA ; RA; connection tissue disease overlap syndrome. Charcot Foot   PAIN:  Are you having pain? Yes: NPRS scale: 8/10 Pain location: left pelvis area  Pain description: aching  Aggravating factors: swimming; waking without support   Relieving factors: not doing things that   Are you having pain? Yes: NPRS scale: 8-9/10 at worst  Pain location: right shoulder  Pain description: aching  Aggravating factors: swimming; = Relieving factors: not doing things that   Are you having pain? Yes: NPRS scale: 8-9/10 at worst 2-3  Pain location: right shoulder  Pain description: aching  Aggravating factors: swimming; waking without support   Relieving factors: not doing things that     PRECAUTIONS: Other: hip : do not cross baseline   WEIGHT BEARING RESTRICTIONS: No  FALLS:  Has patient fallen in last 6 months? No  LIVING ENVIRONMENT: 2 steps in the house and 2 spots around the house in various areas  OCCUPATION:  Retired   PLOF: Independent with community mobility with device  PATIENT GOALS: to walk better   NEXT MD VISIT: 6 months   OBJECTIVE:   DIAGNOSTIC FINDINGS: Post op: Hip intact per patient   PATIENT SURVEYS:   COGNITION: Overall cognitive status: Within functional limits for tasks assessed     SENSATION: Denies paresthesias  EDEMA:  None  MUSCLE LENGTH:   POSTURE: flexed posture left knee valgus  PALPATION:   LOWER EXTREMITY ROM:  Active ROM Right eval Left eval  Hip flexion  WNL   Hip extension    Hip abduction    Hip adduction    Hip internal rotation    Hip external rotation    Knee flexion  Painful end range   Knee extension    Ankle dorsiflexion    Ankle plantarflexion    Ankle inversion    Ankle eversion     (Blank rows = not tested)  LOWER EXTREMITY MMT:  MMT Right eval Left eval  Hip flexion 13.9 10  Hip extension    Hip abduction 16.8 11.4  Hip  adduction    Hip internal rotation    Hip external rotation    Knee flexion    Knee extension 21.7 19  Ankle dorsiflexion    Ankle plantarflexion    Ankle inversion    Ankle eversion     (Blank rows = not tested)  ER/IR WFL Right 90 Shoulder flexion Left 120 shoulder flexion     FUNCTIONAL TESTS:  Will perform 5x sit tostand test next visit.  Sit to stand: uses hands   GAIT: Signifcant left trendelenburgh gait   TODAY'S TREATMENT:                                                                                                                              DATE:   4/30 Quad sets 2x10 each SAQ 3x10 2lbs  PPT x20  1/4 bridge 2x15   Air-ex narrow base EO and EC 2x30 sec each   Tandem stance L and right 2x30 sec each with eyes closed min a with right leg back    4/24  Quad sets 2x10 each SAQ 3x10 2lbs Abduction hip isometrics 3x15 sec hold LAQ 3x10 2lbs Standing weight shift 2x10  Seated hip abduction with band 2x10   Air-ex narrow base EO and EC 2x30 sec each   Tandem stance L and right 2x30 sec each with eyes closed min a with right leg back   Eval  Exercises - Standing Hip Abduction with Counter Support  - 1 x daily - 7 x weekly - 3 sets - 10 reps - Standing Hip Extension with Counter Support  - 1 x daily - 7 x weekly - 3 sets - 10 reps - Standing Hip Flexion AROM  - 1 x daily - 7 x weekly - 3 sets - 10 reps - Shoulder External Rotation and Scapular Retraction with Resistance  - 1 x daily - 7 x weekly - 3 sets - 10 reps - Seated Shoulder Horizontal Abduction with Resistance  - 1 x daily - 7 x weekly - 3 sets - 10 reps  PATIENT EDUCATION:  Education details: HEP, symptom management  Person educated: Patient Education method: Explanation, Demonstration, Tactile cues, Verbal cues, and Handouts Education comprehension: verbalized  understanding, returned demonstration, verbal cues required, tactile cues required, and needs further education  HOME EXERCISE  PROGRAM: Access Code: [email or web address] URL: [email or web address] Date: 11/11/2022 Prepared by: Lorayne Bender  Exercises - Standing Hip Abduction with Counter Support  - 1 x daily - 7 x weekly - 3 sets - 10 reps - Standing Hip Extension with Counter Support  - 1 x daily - 7 x weekly - 3 sets - 10 reps - Standing Hip Flexion AROM  - 1 x daily - 7 x weekly - 3 sets - 10 reps - Shoulder External Rotation and Scapular Retraction with Resistance  - 1 x daily - 7 x weekly - 3 sets - 10 reps - Seated Shoulder Horizontal Abduction with Resistance  - 1 x daily - 7 x weekly - 3 sets - 10 reps  ASSESSMENT:  CLINICAL IMPRESSION: Therapy focused  on zeroing on challenging weights for her quad strengthening exercises. She was able to do a PPR and was able to work somewhat into a bridge. She was advised to keep her bridge in a range that is not painful but challenging. We continue to work on balance exercises.  OBJECTIVE IMPAIRMENTS: Abnormal gait, decreased activity tolerance, decreased balance, decreased endurance, decreased knowledge of use of DME, decreased mobility, difficulty walking, decreased ROM, decreased strength, increased fascial restrictions, increased muscle spasms, postural dysfunction, and pain.   ACTIVITY LIMITATIONS: carrying, lifting, bending, standing, squatting, stairs, transfers, and locomotion level  PARTICIPATION LIMITATIONS: meal prep, cleaning, laundry, driving, shopping, community activity, and yard work  PERSONAL FACTORS: 3+ comorbidities: RA ; multiple hip dislocations; right knee OA; bilateral RTC tears   are also affecting patient's functional outcome.   REHAB POTENTIAL: Good  CLINICAL DECISION MAKING: Evolving/moderate complexity  EVALUATION COMPLEXITY: Moderate   GOALS: Goals reviewed with patient? Yes   SHORT TERM GOALS: Target date: 12/09/2022   Patient will be independent with basic HEP for hip strengthening with no pain or instability  Baseline: Goal  status: INITIAL  2.  Patient will increase gross bilateral hip strength by 5 pounds Baseline:  Goal status: INITIAL  3.  Patient will ambulate 500 feet with upright standing posture and minimal pain with least restrictive assistive device Baseline:  Goal status: INITIAL   LONG TERM GOALS: Target date: 01/07/2023    Patient will return to swimming with minimal pain Baseline:  Goal status: INITIAL  2.  Patient will ambulate community distances with least restrictive assistive device and good posture Baseline:  Goal status: INITIAL  3.  Patient will be independent with complete program for core stability, posture, and to maximize potential to increase gait distance Baseline:  Goal status: INITIAL    PLAN:  PT FREQUENCY: 2x/week  PT DURATION: 8 weeks  PLANNED INTERVENTIONS: Therapeutic exercises, Therapeutic activity, Neuromuscular re-education, Balance training, Gait training, Patient/Family education, Self Care, Joint mobilization (of the shoulder), Stair training, Aquatic Therapy, Dry Needling, Cryotherapy, Moist heat, Taping, and Manual therapy  PLAN FOR NEXT SESSION: Aquatics: Work on gait stability, bilateral hip strengthening, core stability Land: Develop base HEP patient has pain lying supine in her back focus more on standing exercises if tolerated.  Get patient on exercise bike and/or NuStep for self guided gym program   Dessie Coma, PT 11/29/2022, 12:13 PM

## 2022-11-30 ENCOUNTER — Encounter (HOSPITAL_BASED_OUTPATIENT_CLINIC_OR_DEPARTMENT_OTHER): Payer: Self-pay | Admitting: Physical Therapy

## 2022-12-03 ENCOUNTER — Encounter (HOSPITAL_BASED_OUTPATIENT_CLINIC_OR_DEPARTMENT_OTHER): Payer: Self-pay | Admitting: Physical Therapy

## 2022-12-03 ENCOUNTER — Ambulatory Visit (HOSPITAL_BASED_OUTPATIENT_CLINIC_OR_DEPARTMENT_OTHER): Payer: Medicare Other | Attending: Orthopedic Surgery | Admitting: Physical Therapy

## 2022-12-03 DIAGNOSIS — M25552 Pain in left hip: Secondary | ICD-10-CM | POA: Diagnosis not present

## 2022-12-03 DIAGNOSIS — M25511 Pain in right shoulder: Secondary | ICD-10-CM | POA: Insufficient documentation

## 2022-12-03 DIAGNOSIS — R2689 Other abnormalities of gait and mobility: Secondary | ICD-10-CM | POA: Diagnosis not present

## 2022-12-03 DIAGNOSIS — G8929 Other chronic pain: Secondary | ICD-10-CM | POA: Insufficient documentation

## 2022-12-03 DIAGNOSIS — M545 Low back pain, unspecified: Secondary | ICD-10-CM | POA: Insufficient documentation

## 2022-12-03 DIAGNOSIS — M25551 Pain in right hip: Secondary | ICD-10-CM | POA: Diagnosis not present

## 2022-12-03 NOTE — Therapy (Signed)
OUTPATIENT PHYSICAL THERAPY LOWER EXTREMITY EVALUATION   Patient Name: Virginia Gordon MRN: [id number] DOB:[birth year], 72 y.o., female Today's Date: 11/29/2022  END OF SESSION:  PT End of Session - 11/29/22 1209     Visit Number 4    Number of Visits 16    Date for PT Re-Evaluation 01/07/23    Authorization Type MCR [employer]    PT Start Time 1145    PT Stop Time 1227    PT Time Calculation (min) 42 min    Activity Tolerance Patient tolerated treatment well    Behavior During Therapy WFL for tasks assessed/performed             Past Medical History:  Diagnosis Date   Rheumatoid arthritis (HCC)    Past Surgical History:  Procedure Laterality Date   BREAST BIOPSY Left    pt not sure of date.   HIP SURGERY     SHOULDER SURGERY     Patient Active Problem List   Diagnosis Date Noted   Effusion of left knee joint 08/17/2021   ADD (attention deficit disorder) 11/13/2015   Connective tissue disease overlap syndrome (HCC) 11/13/2015   Menopausal and perimenopausal disorder 11/13/2015   Acne erythematosa 11/13/2015   Disordered sleep 11/13/2015   History of operative procedure on hip 07/03/2015   Left knee pain 05/20/2014   Right foot pain 05/14/2014   Bilateral shoulder pain 05/14/2014   Hypothyroidism 03/08/2014   Degenerative arthritis of hip 03/07/2012    PCP: Maple Hudson MD   REFERRING PROVIDER: Dr Nancy Fetter  REFERRING DIAG: [phone] (ICD-10-CM) - Presence of left artificial hip joint   THERAPY DIAG:  Chronic right shoulder pain  Chronic bilateral low back pain without sciatica  Pain in right hip  Pain in left hip  Other abnormalities of gait and mobility  Rationale for Evaluation and Treatment: Rehabilitation  ONSET DATE:   SUBJECTIVE: 4/24 was the original revision surgery. long history of hip dislocations   SUBJECTIVE STATEMENT: The patient was sore after the pool yesterday. She is better today. She has been working more in the pool.  PERTINENT  HISTORY: Right shoulder pain; right knee pain; Right Knee OA ; RA; connection tissue disease overlap syndrome. Charcot Foot   PAIN:  Are you having pain? Yes: NPRS scale: 8/10 Pain location: left pelvis area  Pain description: aching  Aggravating factors: swimming; waking without support   Relieving factors: not doing things that   Are you having pain? Yes: NPRS scale: 8-9/10 at worst  Pain location: right shoulder  Pain description: aching  Aggravating factors: swimming; = Relieving factors: not doing things that   Are you having pain? Yes: NPRS scale: 8-9/10 at worst 2-3  Pain location: right shoulder  Pain description: aching  Aggravating factors: swimming; waking without support   Relieving factors: not doing things that     PRECAUTIONS: Other: hip : do not cross baseline   WEIGHT BEARING RESTRICTIONS: No  FALLS:  Has patient fallen in last 6 months? No  LIVING ENVIRONMENT: 2 steps in the house and 2 spots around the house in various areas  OCCUPATION:  Retired   PLOF: Independent with community mobility with device  PATIENT GOALS: to walk better   NEXT MD VISIT: 6 months   OBJECTIVE:   DIAGNOSTIC FINDINGS: Post op: Hip intact per patient   PATIENT SURVEYS:   COGNITION: Overall cognitive status: Within functional limits for tasks assessed     SENSATION: Denies paresthesias   EDEMA:  None  MUSCLE LENGTH:   POSTURE: flexed posture left knee valgus  PALPATION:   LOWER EXTREMITY ROM:  Active ROM Right eval Left eval  Hip flexion  WNL   Hip extension    Hip abduction    Hip adduction    Hip internal rotation    Hip external rotation    Knee flexion  Painful end range   Knee extension    Ankle dorsiflexion    Ankle plantarflexion    Ankle inversion    Ankle eversion     (Blank rows = not tested)  LOWER EXTREMITY MMT:  MMT Right eval Left eval  Hip flexion 13.9 10  Hip extension    Hip abduction 16.8 11.4  Hip adduction    Hip  internal rotation    Hip external rotation    Knee flexion    Knee extension 21.7 19  Ankle dorsiflexion    Ankle plantarflexion    Ankle inversion    Ankle eversion     (Blank rows = not tested)  ER/IR WFL Right 90 Shoulder flexion Left 120 shoulder flexion     FUNCTIONAL TESTS:  Will perform 5x sit tostand test next visit.  Sit to stand: uses hands   GAIT: Signifcant left trendelenburgh gait   TODAY'S TREATMENT:                                                                                                                              DATE:   5/3 Nu-step baseline HR 81 6 min L3 89 after treatment. PPT x15   Bridge with 1-2 sec hold 3x6   SAQ 3x15 2lbs   Wand flexion 2x10 2lbs  Pain in left shoulder on last rep   Standing hip flexion left 2x15 1 lbs  Standing hip extension 2x15 1 lbs           4/30 Quad sets 2x10 each SAQ 3x10 2lbs  PPT x20  1/4 bridge 2x15   Air-ex narrow base EO and EC 2x30 sec each   Tandem stance L and right 2x30 sec each with eyes closed min a with right leg back    4/24  Quad sets 2x10 each SAQ 3x10 2lbs Abduction hip isometrics 3x15 sec hold LAQ 3x10 2lbs Standing weight shift 2x10  Seated hip abduction with band 2x10   Air-ex narrow base EO and EC 2x30 sec each   Tandem stance L and right 2x30 sec each with eyes closed min a with right leg back   Eval  Exercises - Standing Hip Abduction with Counter Support  - 1 x daily - 7 x weekly - 3 sets - 10 reps - Standing Hip Extension with Counter Support  - 1 x daily - 7 x weekly - 3 sets - 10 reps - Standing Hip Flexion AROM  - 1 x daily - 7 x weekly - 3 sets - 10 reps - Shoulder External Rotation and Scapular Retraction with Resistance  -  1 x daily - 7 x weekly - 3 sets - 10 reps - Seated Shoulder Horizontal Abduction with Resistance  - 1 x daily - 7 x weekly - 3 sets - 10 reps  PATIENT EDUCATION:  Education details: HEP, symptom management  Person educated:  Patient Education method: Explanation, Demonstration, Tactile cues, Verbal cues, and Handouts Education comprehension: verbalized understanding, returned demonstration, verbal cues required, tactile cues required, and needs further education  HOME EXERCISE PROGRAM: Access Code: [email or web address] URL: [email or web address] Date: 11/11/2022 Prepared by: Lorayne Bender  Exercises - Standing Hip Abduction with Counter Support  - 1 x daily - 7 x weekly - 3 sets - 10 reps - Standing Hip Extension with Counter Support  - 1 x daily - 7 x weekly - 3 sets - 10 reps - Standing Hip Flexion AROM  - 1 x daily - 7 x weekly - 3 sets - 10 reps - Shoulder External Rotation and Scapular Retraction with Resistance  - 1 x daily - 7 x weekly - 3 sets - 10 reps - Seated Shoulder Horizontal Abduction with Resistance  - 1 x daily - 7 x weekly - 3 sets - 10 reps  ASSESSMENT:  CLINICAL IMPRESSION: The patient had some difficulty with wand flexion> She otherwise tolerated exercises well. We continue to zone in on a moderate to hard RPE. She is feeling challenged but no additional pain. We    OBJECTIVE IMPAIRMENTS: Abnormal gait, decreased activity tolerance, decreased balance, decreased endurance, decreased knowledge of use of DME, decreased mobility, difficulty walking, decreased ROM, decreased strength, increased fascial restrictions, increased muscle spasms, postural dysfunction, and pain.   ACTIVITY LIMITATIONS: carrying, lifting, bending, standing, squatting, stairs, transfers, and locomotion level  PARTICIPATION LIMITATIONS: meal prep, cleaning, laundry, driving, shopping, community activity, and yard work  PERSONAL FACTORS: 3+ comorbidities: RA ; multiple hip dislocations; right knee OA; bilateral RTC tears   are also affecting patient's functional outcome.   REHAB POTENTIAL: Good  CLINICAL DECISION MAKING: Evolving/moderate complexity  EVALUATION COMPLEXITY: Moderate   GOALS: Goals reviewed  with patient? Yes   SHORT TERM GOALS: Target date: 12/09/2022   Patient will be independent with basic HEP for hip strengthening with no pain or instability  Baseline: Goal status: INITIAL  2.  Patient will increase gross bilateral hip strength by 5 pounds Baseline:  Goal status: INITIAL  3.  Patient will ambulate 500 feet with upright standing posture and minimal pain with least restrictive assistive device Baseline:  Goal status: INITIAL   LONG TERM GOALS: Target date: 01/07/2023    Patient will return to swimming with minimal pain Baseline:  Goal status: INITIAL  2.  Patient will ambulate community distances with least restrictive assistive device and good posture Baseline:  Goal status: INITIAL  3.  Patient will be independent with complete program for core stability, posture, and to maximize potential to increase gait distance Baseline:  Goal status: INITIAL    PLAN:  PT FREQUENCY: 2x/week  PT DURATION: 8 weeks  PLANNED INTERVENTIONS: Therapeutic exercises, Therapeutic activity, Neuromuscular re-education, Balance training, Gait training, Patient/Family education, Self Care, Joint mobilization (of the shoulder), Stair training, Aquatic Therapy, Dry Needling, Cryotherapy, Moist heat, Taping, and Manual therapy  PLAN FOR NEXT SESSION: Aquatics: Work on gait stability, bilateral hip strengthening, core stability Land: Develop base HEP patient has pain lying supine in her back focus more on standing exercises if tolerated.  Get patient on exercise bike and/or NuStep for self guided gym program   Dessie Coma, PT  11/29/2022, 12:13 PM

## 2022-12-06 ENCOUNTER — Encounter (HOSPITAL_BASED_OUTPATIENT_CLINIC_OR_DEPARTMENT_OTHER): Payer: Self-pay | Admitting: Physical Therapy

## 2022-12-06 ENCOUNTER — Ambulatory Visit (HOSPITAL_BASED_OUTPATIENT_CLINIC_OR_DEPARTMENT_OTHER): Payer: Medicare Other | Admitting: Physical Therapy

## 2022-12-06 DIAGNOSIS — G8929 Other chronic pain: Secondary | ICD-10-CM

## 2022-12-06 DIAGNOSIS — M25551 Pain in right hip: Secondary | ICD-10-CM | POA: Diagnosis not present

## 2022-12-06 DIAGNOSIS — M25552 Pain in left hip: Secondary | ICD-10-CM

## 2022-12-06 DIAGNOSIS — R2689 Other abnormalities of gait and mobility: Secondary | ICD-10-CM

## 2022-12-06 DIAGNOSIS — M545 Low back pain, unspecified: Secondary | ICD-10-CM | POA: Diagnosis not present

## 2022-12-06 DIAGNOSIS — M25511 Pain in right shoulder: Secondary | ICD-10-CM | POA: Diagnosis not present

## 2022-12-06 NOTE — Therapy (Signed)
OUTPATIENT PHYSICAL THERAPY LOWER EXTREMITY EVALUATION   Patient Name: Virginia Gordon MRN: [id number] DOB:23-Nov-1950, 72 y.o., female Today's Date: 12/06/2022  END OF SESSION:  PT End of Session - 12/06/22 1935     Visit Number 6    Number of Visits 16    Date for PT Re-Evaluation 01/07/23    Authorization Type MCR [employer]    PT Start Time 1100    PT Stop Time 1142    PT Time Calculation (min) 42 min    Activity Tolerance Patient tolerated treatment well    Behavior During Therapy WFL for tasks assessed/performed             Past Medical History:  Diagnosis Date   Rheumatoid arthritis (HCC)    Past Surgical History:  Procedure Laterality Date   BREAST BIOPSY Left    pt not sure of date.   HIP SURGERY     SHOULDER SURGERY     Patient Active Problem List   Diagnosis Date Noted   Effusion of left knee joint 08/17/2021   ADD (attention deficit disorder) 11/13/2015   Connective tissue disease overlap syndrome (HCC) 11/13/2015   Menopausal and perimenopausal disorder 11/13/2015   Acne erythematosa 11/13/2015   Disordered sleep 11/13/2015   History of operative procedure on hip 07/03/2015   Left knee pain 05/20/2014   Right foot pain 05/14/2014   Bilateral shoulder pain 05/14/2014   Hypothyroidism 03/08/2014   Degenerative arthritis of hip 03/07/2012    PCP: Maple Hudson MD   REFERRING PROVIDER: Dr Nancy Fetter  REFERRING DIAG: [phone] (ICD-10-CM) - Presence of left artificial hip joint   THERAPY DIAG:  Chronic right shoulder pain  Chronic bilateral low back pain without sciatica  Pain in right hip  Pain in left hip  Other abnormalities of gait and mobility  Rationale for Evaluation and Treatment: Rehabilitation  ONSET DATE:   SUBJECTIVE: 4/24 was the original revision surgery. long history of hip dislocations   SUBJECTIVE STATEMENT: The patient reports fatigue from swimming yesterday. She had no significant pain after the last visit.    PERTINENT  HISTORY: Right shoulder pain; right knee pain; Right Knee OA ; RA; connection tissue disease overlap syndrome. Charcot Foot   PAIN:  Are you having pain? Yes: NPRS scale: 8/10 Pain location: left pelvis area  Pain description: aching  Aggravating factors: swimming; waking without support   Relieving factors: not doing things that   Are you having pain? Yes: NPRS scale: 8-9/10 at worst  Pain location: right shoulder  Pain description: aching  Aggravating factors: swimming; = Relieving factors: not doing things that   Are you having pain? Yes: NPRS scale: 8-9/10 at worst 2-3  Pain location: right shoulder  Pain description: aching  Aggravating factors: swimming; waking without support   Relieving factors: not doing things that     PRECAUTIONS: Other: hip : do not cross baseline   WEIGHT BEARING RESTRICTIONS: No  FALLS:  Has patient fallen in last 6 months? No  LIVING ENVIRONMENT: 2 steps in the house and 2 spots around the house in various areas  OCCUPATION:  Retired   PLOF: Independent with community mobility with device  PATIENT GOALS: to walk better   NEXT MD VISIT: 6 months   OBJECTIVE:   DIAGNOSTIC FINDINGS: Post op: Hip intact per patient   PATIENT SURVEYS:   COGNITION: Overall cognitive status: Within functional limits for tasks assessed     SENSATION: Denies paresthesias   EDEMA:  None  MUSCLE  LENGTH:   POSTURE: flexed posture left knee valgus  PALPATION:   LOWER EXTREMITY ROM:  Active ROM Right eval Left eval  Hip flexion  WNL   Hip extension    Hip abduction    Hip adduction    Hip internal rotation    Hip external rotation    Knee flexion  Painful end range   Knee extension    Ankle dorsiflexion    Ankle plantarflexion    Ankle inversion    Ankle eversion     (Blank rows = not tested)  LOWER EXTREMITY MMT:  MMT Right eval Left eval  Hip flexion 13.9 10  Hip extension    Hip abduction 16.8 11.4  Hip adduction    Hip  internal rotation    Hip external rotation    Knee flexion    Knee extension 21.7 19  Ankle dorsiflexion    Ankle plantarflexion    Ankle inversion    Ankle eversion     (Blank rows = not tested)  ER/IR WFL Right 90 Shoulder flexion Left 120 shoulder flexion     FUNCTIONAL TESTS:  Will perform 5x sit tostand test next visit.  Sit to stand: uses hands   GAIT: Signifcant left trendelenburgh gait   TODAY'S TREATMENT:                                                                                                                              DATE:   5/6 Ex bike: baseline 88 HR 94  SAQ 3x15 2lbs   Hip abduction with graded pressure 2x10   Standing hip flexion left 2x15 1 lbs  Standing hip extension 2x15 1 lbs   LAQ 2x10 1.5   All exercies kept at an RPE of 5-6      5/3 Nu-step baseline HR 81 6 min L3 89 after treatment. PPT x15   Bridge with 1-2 sec hold 3x6   SAQ 3x15 2lbs   Wand flexion 2x10 2lbs  Pain in left shoulder on last rep   Standing hip flexion left 2x15 1 lbs  Standing hip extension 2x15 1 lbs           4/30 Quad sets 2x10 each SAQ 3x10 2lbs  PPT x20  1/4 bridge 2x15   Air-ex narrow base EO and EC 2x30 sec each   Tandem stance L and right 2x30 sec each with eyes closed min a with right leg back    4/24  Quad sets 2x10 each SAQ 3x10 2lbs Abduction hip isometrics 3x15 sec hold LAQ 3x10 2lbs Standing weight shift 2x10  Seated hip abduction with band 2x10   Air-ex narrow base EO and EC 2x30 sec each   Tandem stance L and right 2x30 sec each with eyes closed min a with right leg back   Eval  Exercises - Standing Hip Abduction with Counter Support  - 1 x daily - 7 x weekly - 3 sets -  10 reps - Standing Hip Extension with Counter Support  - 1 x daily - 7 x weekly - 3 sets - 10 reps - Standing Hip Flexion AROM  - 1 x daily - 7 x weekly - 3 sets - 10 reps - Shoulder External Rotation and Scapular Retraction with Resistance  -  1 x daily - 7 x weekly - 3 sets - 10 reps - Seated Shoulder Horizontal Abduction with Resistance  - 1 x daily - 7 x weekly - 3 sets - 10 reps  PATIENT EDUCATION:  Education details: HEP, symptom management  Person educated: Patient Education method: Explanation, Demonstration, Tactile cues, Verbal cues, and Handouts Education comprehension: verbalized understanding, returned demonstration, verbal cues required, tactile cues required, and needs further education  HOME EXERCISE PROGRAM: Access Code: [email or web address] URL: [email or web address] Date: 11/11/2022 Prepared by: Lorayne Bender  Exercises - Standing Hip Abduction with Counter Support  - 1 x daily - 7 x weekly - 3 sets - 10 reps - Standing Hip Extension with Counter Support  - 1 x daily - 7 x weekly - 3 sets - 10 reps - Standing Hip Flexion AROM  - 1 x daily - 7 x weekly - 3 sets - 10 reps - Shoulder External Rotation and Scapular Retraction with Resistance  - 1 x daily - 7 x weekly - 3 sets - 10 reps - Seated Shoulder Horizontal Abduction with Resistance  - 1 x daily - 7 x weekly - 3 sets - 10 reps  ASSESSMENT:  CLINICAL IMPRESSION: The patient tolerated treatment well. We continue to advance the weight of her exercises. We are trying to keep her RPE up around a 5-6. We worked on grade pressure with abduction. Overall she I s making progress. She was able to give more pressure with abduction. We have been able to advance her weight. She is doing well with pool activity. We perfromed trial of the exercise bike today. She tolerated well.   OBJECTIVE IMPAIRMENTS: Abnormal gait, decreased activity tolerance, decreased balance, decreased endurance, decreased knowledge of use of DME, decreased mobility, difficulty walking, decreased ROM, decreased strength, increased fascial restrictions, increased muscle spasms, postural dysfunction, and pain.   ACTIVITY LIMITATIONS: carrying, lifting, bending, standing, squatting, stairs,  transfers, and locomotion level  PARTICIPATION LIMITATIONS: meal prep, cleaning, laundry, driving, shopping, community activity, and yard work  PERSONAL FACTORS: 3+ comorbidities: RA ; multiple hip dislocations; right knee OA; bilateral RTC tears   are also affecting patient's functional outcome.   REHAB POTENTIAL: Good  CLINICAL DECISION MAKING: Evolving/moderate complexity  EVALUATION COMPLEXITY: Moderate   GOALS: Goals reviewed with patient? Yes   SHORT TERM GOALS: Target date: 12/09/2022   Patient will be independent with basic HEP for hip strengthening with no pain or instability  Baseline: Goal status: INITIAL  2.  Patient will increase gross bilateral hip strength by 5 pounds Baseline:  Goal status: INITIAL  3.  Patient will ambulate 500 feet with upright standing posture and minimal pain with least restrictive assistive device Baseline:  Goal status: INITIAL   LONG TERM GOALS: Target date: 01/07/2023    Patient will return to swimming with minimal pain Baseline:  Goal status: INITIAL  2.  Patient will ambulate community distances with least restrictive assistive device and good posture Baseline:  Goal status: INITIAL  3.  Patient will be independent with complete program for core stability, posture, and to maximize potential to increase gait distance Baseline:  Goal status: INITIAL    PLAN:  PT FREQUENCY:  2x/week  PT DURATION: 8 weeks  PLANNED INTERVENTIONS: Therapeutic exercises, Therapeutic activity, Neuromuscular re-education, Balance training, Gait training, Patient/Family education, Self Care, Joint mobilization (of the shoulder), Stair training, Aquatic Therapy, Dry Needling, Cryotherapy, Moist heat, Taping, and Manual therapy  PLAN FOR NEXT SESSION: Aquatics: Work on gait stability, bilateral hip strengthening, core stability Land: Develop base HEP patient has pain lying supine in her back focus more on standing exercises if tolerated.  Get patient  on exercise bike and/or NuStep for self guided gym program   Dessie Coma, PT 12/06/2022, 9:06 PM

## 2022-12-07 DIAGNOSIS — M4807 Spinal stenosis, lumbosacral region: Secondary | ICD-10-CM | POA: Diagnosis not present

## 2022-12-07 DIAGNOSIS — M47816 Spondylosis without myelopathy or radiculopathy, lumbar region: Secondary | ICD-10-CM | POA: Diagnosis not present

## 2022-12-07 DIAGNOSIS — M533 Sacrococcygeal disorders, not elsewhere classified: Secondary | ICD-10-CM | POA: Diagnosis not present

## 2022-12-10 ENCOUNTER — Ambulatory Visit (HOSPITAL_BASED_OUTPATIENT_CLINIC_OR_DEPARTMENT_OTHER): Payer: Medicare Other | Admitting: Physical Therapy

## 2022-12-10 ENCOUNTER — Encounter (HOSPITAL_BASED_OUTPATIENT_CLINIC_OR_DEPARTMENT_OTHER): Payer: Self-pay | Admitting: Physical Therapy

## 2022-12-10 DIAGNOSIS — R2689 Other abnormalities of gait and mobility: Secondary | ICD-10-CM

## 2022-12-10 DIAGNOSIS — M25511 Pain in right shoulder: Secondary | ICD-10-CM | POA: Diagnosis not present

## 2022-12-10 DIAGNOSIS — M25551 Pain in right hip: Secondary | ICD-10-CM

## 2022-12-10 DIAGNOSIS — G8929 Other chronic pain: Secondary | ICD-10-CM

## 2022-12-10 DIAGNOSIS — M545 Low back pain, unspecified: Secondary | ICD-10-CM

## 2022-12-10 DIAGNOSIS — M25552 Pain in left hip: Secondary | ICD-10-CM | POA: Diagnosis not present

## 2022-12-10 NOTE — Therapy (Signed)
OUTPATIENT PHYSICAL THERAPY LOWER EXTREMITY EVALUATION   Patient Name: Virginia Gordon MRN: [id number] DOB:23-Nov-1950, 72 y.o., female Today's Date: 12/06/2022  END OF SESSION:  PT End of Session - 12/06/22 1935     Visit Number 6    Number of Visits 16    Date for PT Re-Evaluation 01/07/23    Authorization Type MCR [employer]    PT Start Time 1100    PT Stop Time 1142    PT Time Calculation (min) 42 min    Activity Tolerance Patient tolerated treatment well    Behavior During Therapy WFL for tasks assessed/performed             Past Medical History:  Diagnosis Date   Rheumatoid arthritis (HCC)    Past Surgical History:  Procedure Laterality Date   BREAST BIOPSY Left    pt not sure of date.   HIP SURGERY     SHOULDER SURGERY     Patient Active Problem List   Diagnosis Date Noted   Effusion of left knee joint 08/17/2021   ADD (attention deficit disorder) 11/13/2015   Connective tissue disease overlap syndrome (HCC) 11/13/2015   Menopausal and perimenopausal disorder 11/13/2015   Acne erythematosa 11/13/2015   Disordered sleep 11/13/2015   History of operative procedure on hip 07/03/2015   Left knee pain 05/20/2014   Right foot pain 05/14/2014   Bilateral shoulder pain 05/14/2014   Hypothyroidism 03/08/2014   Degenerative arthritis of hip 03/07/2012    PCP: Maple Hudson MD   REFERRING PROVIDER: Dr Nancy Fetter  REFERRING DIAG: [phone] (ICD-10-CM) - Presence of left artificial hip joint   THERAPY DIAG:  Chronic right shoulder pain  Chronic bilateral low back pain without sciatica  Pain in right hip  Pain in left hip  Other abnormalities of gait and mobility  Rationale for Evaluation and Treatment: Rehabilitation  ONSET DATE:   SUBJECTIVE: 4/24 was the original revision surgery. long history of hip dislocations   SUBJECTIVE STATEMENT: The patient reports fatigue from swimming yesterday. She had no significant pain after the last visit.    PERTINENT  HISTORY: Right shoulder pain; right knee pain; Right Knee OA ; RA; connection tissue disease overlap syndrome. Charcot Foot   PAIN:  Are you having pain? Yes: NPRS scale: 8/10 Pain location: left pelvis area  Pain description: aching  Aggravating factors: swimming; waking without support   Relieving factors: not doing things that   Are you having pain? Yes: NPRS scale: 8-9/10 at worst  Pain location: right shoulder  Pain description: aching  Aggravating factors: swimming; = Relieving factors: not doing things that   Are you having pain? Yes: NPRS scale: 8-9/10 at worst 2-3  Pain location: right shoulder  Pain description: aching  Aggravating factors: swimming; waking without support   Relieving factors: not doing things that     PRECAUTIONS: Other: hip : do not cross baseline   WEIGHT BEARING RESTRICTIONS: No  FALLS:  Has patient fallen in last 6 months? No  LIVING ENVIRONMENT: 2 steps in the house and 2 spots around the house in various areas  OCCUPATION:  Retired   PLOF: Independent with community mobility with device  PATIENT GOALS: to walk better   NEXT MD VISIT: 6 months   OBJECTIVE:   DIAGNOSTIC FINDINGS: Post op: Hip intact per patient   PATIENT SURVEYS:   COGNITION: Overall cognitive status: Within functional limits for tasks assessed     SENSATION: Denies paresthesias   EDEMA:  None  MUSCLE  LENGTH:   POSTURE: flexed posture left knee valgus  PALPATION:   LOWER EXTREMITY ROM:  Active ROM Right eval Left eval  Hip flexion  WNL   Hip extension    Hip abduction    Hip adduction    Hip internal rotation    Hip external rotation    Knee flexion  Painful end range   Knee extension    Ankle dorsiflexion    Ankle plantarflexion    Ankle inversion    Ankle eversion     (Blank rows = not tested)  LOWER EXTREMITY MMT:  MMT Right eval Left eval  Hip flexion 13.9 10  Hip extension    Hip abduction 16.8 11.4  Hip adduction    Hip  internal rotation    Hip external rotation    Knee flexion    Knee extension 21.7 19  Ankle dorsiflexion    Ankle plantarflexion    Ankle inversion    Ankle eversion     (Blank rows = not tested)  ER/IR WFL Right 90 Shoulder flexion Left 120 shoulder flexion     FUNCTIONAL TESTS:  Will perform 5x sit tostand test next visit.  Sit to stand: uses hands   GAIT: Signifcant left trendelenburgh gait   TODAY'S TREATMENT:                                                                                                                              DATE:   5/10 Ex bike: baseline 88 HR 94  SAQ 3x15 2.5   LAQ 2x10 1.5 lbs    Step up 2x10 2 inch Patients RPE reached 6 on second set   Supine march 1st set no weights RPE of 2/10   Supine march 1lb RPE of 6/10   5/6 Ex bike: baseline 88 HR 94  SAQ 3x15 2.5lbs RPE of 6   Hip abduction with graded pressure 2x10   LAQ 2x10 1.5   All exercies kept at an RPE of 5-6      5/3 Nu-step baseline HR 81 6 min L3 89 after treatment. PPT x15   Bridge with 1-2 sec hold 3x6   SAQ 3x15 2lbs   Wand flexion 2x10 2lbs  Pain in left shoulder on last rep   Standing hip flexion left 2x15 1 lbs  Standing hip extension 2x15 1 lbs           4/30 Quad sets 2x10 each SAQ 3x10 2lbs  PPT x20  1/4 bridge 2x15   Air-ex narrow base EO and EC 2x30 sec each   Tandem stance L and right 2x30 sec each with eyes closed min a with right leg back    4/24  Quad sets 2x10 each SAQ 3x10 2lbs Abduction hip isometrics 3x15 sec hold LAQ 3x10 2lbs Standing weight shift 2x10  Seated hip abduction with band 2x10   Air-ex narrow base EO and EC 2x30 sec each   Tandem stance  L and right 2x30 sec each with eyes closed min a with right leg back   Eval  Exercises - Standing Hip Abduction with Counter Support  - 1 x daily - 7 x weekly - 3 sets - 10 reps - Standing Hip Extension with Counter Support  - 1 x daily - 7 x weekly - 3 sets - 10  reps - Standing Hip Flexion AROM  - 1 x daily - 7 x weekly - 3 sets - 10 reps - Shoulder External Rotation and Scapular Retraction with Resistance  - 1 x daily - 7 x weekly - 3 sets - 10 reps - Seated Shoulder Horizontal Abduction with Resistance  - 1 x daily - 7 x weekly - 3 sets - 10 reps  PATIENT EDUCATION:  Education details: HEP, symptom management  Person educated: Patient Education method: Explanation, Demonstration, Tactile cues, Verbal cues, and Handouts Education comprehension: verbalized understanding, returned demonstration, verbal cues required, tactile cues required, and needs further education  HOME EXERCISE PROGRAM: Access Code: [email or web address] URL: [email or web address] Date: 11/11/2022 Prepared by: Lorayne Bender  Exercises - Standing Hip Abduction with Counter Support  - 1 x daily - 7 x weekly - 3 sets - 10 reps - Standing Hip Extension with Counter Support  - 1 x daily - 7 x weekly - 3 sets - 10 reps - Standing Hip Flexion AROM  - 1 x daily - 7 x weekly - 3 sets - 10 reps - Shoulder External Rotation and Scapular Retraction with Resistance  - 1 x daily - 7 x weekly - 3 sets - 10 reps - Seated Shoulder Horizontal Abduction with Resistance  - 1 x daily - 7 x weekly - 3 sets - 10 reps  ASSESSMENT:  CLINICAL IMPRESSION: Therapy added in steps to the patients plan today. She felt that a 2 inch was easy initially but it became harder as she did more. We continue to advance her weight while keeping her RPE at a challenging level. She had no increase in pain> She did well with the exercise bike today.   OBJECTIVE IMPAIRMENTS: Abnormal gait, decreased activity tolerance, decreased balance, decreased endurance, decreased knowledge of use of DME, decreased mobility, difficulty walking, decreased ROM, decreased strength, increased fascial restrictions, increased muscle spasms, postural dysfunction, and pain.   ACTIVITY LIMITATIONS: carrying, lifting, bending, standing,  squatting, stairs, transfers, and locomotion level  PARTICIPATION LIMITATIONS: meal prep, cleaning, laundry, driving, shopping, community activity, and yard work  PERSONAL FACTORS: 3+ comorbidities: RA ; multiple hip dislocations; right knee OA; bilateral RTC tears   are also affecting patient's functional outcome.   REHAB POTENTIAL: Good  CLINICAL DECISION MAKING: Evolving/moderate complexity  EVALUATION COMPLEXITY: Moderate   GOALS: Goals reviewed with patient? Yes   SHORT TERM GOALS: Target date: 12/09/2022   Patient will be independent with basic HEP for hip strengthening with no pain or instability  Baseline: Goal status: INITIAL  2.  Patient will increase gross bilateral hip strength by 5 pounds Baseline:  Goal status: INITIAL  3.  Patient will ambulate 500 feet with upright standing posture and minimal pain with least restrictive assistive device Baseline:  Goal status: INITIAL   LONG TERM GOALS: Target date: 01/07/2023    Patient will return to swimming with minimal pain Baseline:  Goal status: INITIAL  2.  Patient will ambulate community distances with least restrictive assistive device and good posture Baseline:  Goal status: INITIAL  3.  Patient will be independent with complete program for core stability,  posture, and to maximize potential to increase gait distance Baseline:  Goal status: INITIAL    PLAN:  PT FREQUENCY: 2x/week  PT DURATION: 8 weeks  PLANNED INTERVENTIONS: Therapeutic exercises, Therapeutic activity, Neuromuscular re-education, Balance training, Gait training, Patient/Family education, Self Care, Joint mobilization (of the shoulder), Stair training, Aquatic Therapy, Dry Needling, Cryotherapy, Moist heat, Taping, and Manual therapy  PLAN FOR NEXT SESSION: Aquatics: Work on gait stability, bilateral hip strengthening, core stability Land: Develop base HEP patient has pain lying supine in her back focus more on standing exercises if  tolerated.  Get patient on exercise bike and/or NuStep for self guided gym program   Dessie Coma, PT 12/06/2022, 9:06 PM

## 2022-12-12 NOTE — Therapy (Unsigned)
OUTPATIENT PHYSICAL THERAPY LOWER EXTREMITY EVALUATION   Patient Name: Virginia Gordon MRN: [id number] DOB:1951/08/02, 72 y.o., female Today's Date: 12/06/2022  END OF SESSION:  PT End of Session - 12/06/22 1935     Visit Number 6    Number of Visits 16    Date for PT Re-Evaluation 01/07/23    Authorization Type MCR [employer]    PT Start Time 1100    PT Stop Time 1142    PT Time Calculation (min) 42 min    Activity Tolerance Patient tolerated treatment well    Behavior During Therapy WFL for tasks assessed/performed             Past Medical History:  Diagnosis Date   Rheumatoid arthritis (HCC)    Past Surgical History:  Procedure Laterality Date   BREAST BIOPSY Left    pt not sure of date.   HIP SURGERY     SHOULDER SURGERY     Patient Active Problem List   Diagnosis Date Noted   Effusion of left knee joint 08/17/2021   ADD (attention deficit disorder) 11/13/2015   Connective tissue disease overlap syndrome (HCC) 11/13/2015   Menopausal and perimenopausal disorder 11/13/2015   Acne erythematosa 11/13/2015   Disordered sleep 11/13/2015   History of operative procedure on hip 07/03/2015   Left knee pain 05/20/2014   Right foot pain 05/14/2014   Bilateral shoulder pain 05/14/2014   Hypothyroidism 03/08/2014   Degenerative arthritis of hip 03/07/2012    PCP: Maple Hudson MD   REFERRING PROVIDER: Dr Nancy Fetter  REFERRING DIAG: [phone] (ICD-10-CM) - Presence of left artificial hip joint   THERAPY DIAG:  Chronic right shoulder pain  Chronic bilateral low back pain without sciatica  Pain in right hip  Pain in left hip  Other abnormalities of gait and mobility  Rationale for Evaluation and Treatment: Rehabilitation  ONSET DATE:   SUBJECTIVE: 4/24 was the original revision surgery. long history of hip dislocations   SUBJECTIVE STATEMENT: The patient reports fatigue from swimming yesterday. She had no significant pain after the last visit.    PERTINENT  HISTORY: Right shoulder pain; right knee pain; Right Knee OA ; RA; connection tissue disease overlap syndrome. Charcot Foot   PAIN:  Are you having pain? Yes: NPRS scale: 8/10 Pain location: left pelvis area  Pain description: aching  Aggravating factors: swimming; waking without support   Relieving factors: not doing things that   Are you having pain? Yes: NPRS scale: 8-9/10 at worst  Pain location: right shoulder  Pain description: aching  Aggravating factors: swimming; = Relieving factors: not doing things that   Are you having pain? Yes: NPRS scale: 8-9/10 at worst 2-3  Pain location: right shoulder  Pain description: aching  Aggravating factors: swimming; waking without support   Relieving factors: not doing things that     PRECAUTIONS: Other: hip : do not cross baseline   WEIGHT BEARING RESTRICTIONS: No  FALLS:  Has patient fallen in last 6 months? No  LIVING ENVIRONMENT: 2 steps in the house and 2 spots around the house in various areas  OCCUPATION:  Retired   PLOF: Independent with community mobility with device  PATIENT GOALS: to walk better   NEXT MD VISIT: 6 months   OBJECTIVE:   DIAGNOSTIC FINDINGS: Post op: Hip intact per patient   PATIENT SURVEYS:   COGNITION: Overall cognitive status: Within functional limits for tasks assessed     SENSATION: Denies paresthesias   EDEMA:  None  MUSCLE  LENGTH:   POSTURE: flexed posture left knee valgus  PALPATION:   LOWER EXTREMITY ROM:  Active ROM Right eval Left eval  Hip flexion  WNL   Hip extension    Hip abduction    Hip adduction    Hip internal rotation    Hip external rotation    Knee flexion  Painful end range   Knee extension    Ankle dorsiflexion    Ankle plantarflexion    Ankle inversion    Ankle eversion     (Blank rows = not tested)  LOWER EXTREMITY MMT:  MMT Right eval Left eval  Hip flexion 13.9 10  Hip extension    Hip abduction 16.8 11.4  Hip adduction    Hip  internal rotation    Hip external rotation    Knee flexion    Knee extension 21.7 19  Ankle dorsiflexion    Ankle plantarflexion    Ankle inversion    Ankle eversion     (Blank rows = not tested)  ER/IR WFL Right 90 Shoulder flexion Left 120 shoulder flexion     FUNCTIONAL TESTS:  Will perform 5x sit tostand test next visit.  Sit to stand: uses hands   GAIT: Signifcant left trendelenburgh gait   TODAY'S TREATMENT:                                                                                                                              DATE:   5/10 Ex bike: baseline 88 HR 94  SAQ 3x15 2.5   LAQ 2x10 1.5 lbs    Step up 2x10 2 inch Patients RPE reached 6 on second set   Supine march 1st set no weights RPE of 2/10   Supine march 1lb RPE of 6/10   5/6 Ex bike: baseline 88 HR 94  SAQ 3x15 2.5lbs RPE of 6   Hip abduction with graded pressure 2x10   LAQ 2x10 1.5   All exercies kept at an RPE of 5-6      5/3 Nu-step baseline HR 81 6 min L3 89 after treatment. PPT x15   Bridge with 1-2 sec hold 3x6   SAQ 3x15 2lbs   Wand flexion 2x10 2lbs  Pain in left shoulder on last rep   Standing hip flexion left 2x15 1 lbs  Standing hip extension 2x15 1 lbs           4/30 Quad sets 2x10 each SAQ 3x10 2lbs  PPT x20  1/4 bridge 2x15   Air-ex narrow base EO and EC 2x30 sec each   Tandem stance L and right 2x30 sec each with eyes closed min a with right leg back    4/24  Quad sets 2x10 each SAQ 3x10 2lbs Abduction hip isometrics 3x15 sec hold LAQ 3x10 2lbs Standing weight shift 2x10  Seated hip abduction with band 2x10   Air-ex narrow base EO and EC 2x30 sec each   Tandem stance  L and right 2x30 sec each with eyes closed min a with right leg back   Eval  Exercises - Standing Hip Abduction with Counter Support  - 1 x daily - 7 x weekly - 3 sets - 10 reps - Standing Hip Extension with Counter Support  - 1 x daily - 7 x weekly - 3 sets - 10  reps - Standing Hip Flexion AROM  - 1 x daily - 7 x weekly - 3 sets - 10 reps - Shoulder External Rotation and Scapular Retraction with Resistance  - 1 x daily - 7 x weekly - 3 sets - 10 reps - Seated Shoulder Horizontal Abduction with Resistance  - 1 x daily - 7 x weekly - 3 sets - 10 reps  PATIENT EDUCATION:  Education details: HEP, symptom management  Person educated: Patient Education method: Explanation, Demonstration, Tactile cues, Verbal cues, and Handouts Education comprehension: verbalized understanding, returned demonstration, verbal cues required, tactile cues required, and needs further education  HOME EXERCISE PROGRAM: Access Code: [email or web address] URL: [email or web address] Date: 11/11/2022 Prepared by: Lorayne Bender  Exercises - Standing Hip Abduction with Counter Support  - 1 x daily - 7 x weekly - 3 sets - 10 reps - Standing Hip Extension with Counter Support  - 1 x daily - 7 x weekly - 3 sets - 10 reps - Standing Hip Flexion AROM  - 1 x daily - 7 x weekly - 3 sets - 10 reps - Shoulder External Rotation and Scapular Retraction with Resistance  - 1 x daily - 7 x weekly - 3 sets - 10 reps - Seated Shoulder Horizontal Abduction with Resistance  - 1 x daily - 7 x weekly - 3 sets - 10 reps  ASSESSMENT:  CLINICAL IMPRESSION: Therapy added in steps to the patients plan today. She felt that a 2 inch was easy initially but it became harder as she did more. We continue to advance her weight while keeping her RPE at a challenging level. She had no increase in pain> She did well with the exercise bike today.   OBJECTIVE IMPAIRMENTS: Abnormal gait, decreased activity tolerance, decreased balance, decreased endurance, decreased knowledge of use of DME, decreased mobility, difficulty walking, decreased ROM, decreased strength, increased fascial restrictions, increased muscle spasms, postural dysfunction, and pain.   ACTIVITY LIMITATIONS: carrying, lifting, bending, standing,  squatting, stairs, transfers, and locomotion level  PARTICIPATION LIMITATIONS: meal prep, cleaning, laundry, driving, shopping, community activity, and yard work  PERSONAL FACTORS: 3+ comorbidities: RA ; multiple hip dislocations; right knee OA; bilateral RTC tears   are also affecting patient's functional outcome.   REHAB POTENTIAL: Good  CLINICAL DECISION MAKING: Evolving/moderate complexity  EVALUATION COMPLEXITY: Moderate   GOALS: Goals reviewed with patient? Yes   SHORT TERM GOALS: Target date: 12/09/2022   Patient will be independent with basic HEP for hip strengthening with no pain or instability  Baseline: Goal status: INITIAL  2.  Patient will increase gross bilateral hip strength by 5 pounds Baseline:  Goal status: INITIAL  3.  Patient will ambulate 500 feet with upright standing posture and minimal pain with least restrictive assistive device Baseline:  Goal status: INITIAL   LONG TERM GOALS: Target date: 01/07/2023    Patient will return to swimming with minimal pain Baseline:  Goal status: INITIAL  2.  Patient will ambulate community distances with least restrictive assistive device and good posture Baseline:  Goal status: INITIAL  3.  Patient will be independent with complete program for core stability,  posture, and to maximize potential to increase gait distance Baseline:  Goal status: INITIAL    PLAN:  PT FREQUENCY: 2x/week  PT DURATION: 8 weeks  PLANNED INTERVENTIONS: Therapeutic exercises, Therapeutic activity, Neuromuscular re-education, Balance training, Gait training, Patient/Family education, Self Care, Joint mobilization (of the shoulder), Stair training, Aquatic Therapy, Dry Needling, Cryotherapy, Moist heat, Taping, and Manual therapy  PLAN FOR NEXT SESSION: Aquatics: Work on gait stability, bilateral hip strengthening, core stability Land: Develop base HEP patient has pain lying supine in her back focus more on standing exercises if  tolerated.  Get patient on exercise bike and/or NuStep for self guided gym program   Dessie Coma, PT 12/06/2022, 9:06 PM

## 2022-12-13 ENCOUNTER — Encounter (HOSPITAL_BASED_OUTPATIENT_CLINIC_OR_DEPARTMENT_OTHER): Payer: Self-pay | Admitting: Physical Therapy

## 2022-12-13 ENCOUNTER — Ambulatory Visit (HOSPITAL_BASED_OUTPATIENT_CLINIC_OR_DEPARTMENT_OTHER): Payer: Medicare Other | Admitting: Physical Therapy

## 2022-12-13 DIAGNOSIS — M25551 Pain in right hip: Secondary | ICD-10-CM | POA: Diagnosis not present

## 2022-12-13 DIAGNOSIS — M25511 Pain in right shoulder: Secondary | ICD-10-CM | POA: Diagnosis not present

## 2022-12-13 DIAGNOSIS — M25552 Pain in left hip: Secondary | ICD-10-CM

## 2022-12-13 DIAGNOSIS — G8929 Other chronic pain: Secondary | ICD-10-CM | POA: Diagnosis not present

## 2022-12-13 DIAGNOSIS — R2689 Other abnormalities of gait and mobility: Secondary | ICD-10-CM | POA: Diagnosis not present

## 2022-12-13 DIAGNOSIS — M545 Low back pain, unspecified: Secondary | ICD-10-CM | POA: Diagnosis not present

## 2022-12-15 ENCOUNTER — Encounter (HOSPITAL_BASED_OUTPATIENT_CLINIC_OR_DEPARTMENT_OTHER): Payer: Self-pay | Admitting: Physical Therapy

## 2022-12-15 ENCOUNTER — Ambulatory Visit (HOSPITAL_BASED_OUTPATIENT_CLINIC_OR_DEPARTMENT_OTHER): Payer: Medicare Other | Admitting: Physical Therapy

## 2022-12-15 DIAGNOSIS — R2689 Other abnormalities of gait and mobility: Secondary | ICD-10-CM | POA: Diagnosis not present

## 2022-12-15 DIAGNOSIS — G8929 Other chronic pain: Secondary | ICD-10-CM | POA: Diagnosis not present

## 2022-12-15 DIAGNOSIS — M545 Low back pain, unspecified: Secondary | ICD-10-CM | POA: Diagnosis not present

## 2022-12-15 DIAGNOSIS — M25552 Pain in left hip: Secondary | ICD-10-CM | POA: Diagnosis not present

## 2022-12-15 DIAGNOSIS — M25551 Pain in right hip: Secondary | ICD-10-CM | POA: Diagnosis not present

## 2022-12-15 DIAGNOSIS — M25511 Pain in right shoulder: Secondary | ICD-10-CM | POA: Diagnosis not present

## 2022-12-15 NOTE — Therapy (Signed)
OUTPATIENT PHYSICAL THERAPY LOWER EXTREMITY EVALUATION   Patient Name: Virginia Gordon MRN: [id number] DOB:[birth year], 72 y.o., female Today's Date: 12/13/2022  END OF SESSION:  PT End of Session - 12/13/22 1250     Visit Number 8    Number of Visits 16    Date for PT Re-Evaluation 01/07/23    Authorization Type MCR USAA    PT Start Time 1209    PT Stop Time 1251    PT Time Calculation (min) 42 min    Activity Tolerance Patient tolerated treatment well    Behavior During Therapy WFL for tasks assessed/performed             Past Medical History:  Diagnosis Date   Rheumatoid arthritis (HCC)    Past Surgical History:  Procedure Laterality Date   BREAST BIOPSY Left    pt not sure of date.   HIP SURGERY     SHOULDER SURGERY     Patient Active Problem List   Diagnosis Date Noted   Effusion of left knee joint 08/17/2021   ADD (attention deficit disorder) 11/13/2015   Connective tissue disease overlap syndrome (HCC) 11/13/2015   Menopausal and perimenopausal disorder 11/13/2015   Acne erythematosa 11/13/2015   Disordered sleep 11/13/2015   History of operative procedure on hip 07/03/2015   Left knee pain 05/20/2014   Right foot pain 05/14/2014   Bilateral shoulder pain 05/14/2014   Hypothyroidism 03/08/2014   Degenerative arthritis of hip 03/07/2012    PCP: Maple Hudson MD   REFERRING PROVIDER: Dr Nancy Fetter  REFERRING DIAG: [phone] (ICD-10-CM) - Presence of left artificial hip joint   THERAPY DIAG:  Chronic bilateral low back pain without sciatica  Pain in right hip  Pain in left hip  Rationale for Evaluation and Treatment: Rehabilitation  ONSET DATE:   SUBJECTIVE: 4/24 was the original revision surgery. long history of hip dislocations   SUBJECTIVE STATEMENT: The patient reports she worked hard int he pool. Overall she is tolerating the exercises that have been given to her well.    PERTINENT HISTORY: Right shoulder pain; right knee pain; Right  Knee OA ; RA; connection tissue disease overlap syndrome. Charcot Foot   PAIN:  Are you having pain? Yes: NPRS scale: 8/10 Pain location: left pelvis area  Pain description: aching  Aggravating factors: swimming; waking without support   Relieving factors: not doing things that   Are you having pain? Yes: NPRS scale: 8-9/10 at worst  Pain location: right shoulder  Pain description: aching  Aggravating factors: swimming; = Relieving factors: not doing things that   Are you having pain? Yes: NPRS scale: 8-9/10 at worst 2-3  Pain location: right shoulder  Pain description: aching  Aggravating factors: swimming; waking without support   Relieving factors: not doing things that     PRECAUTIONS: Other: hip : do not cross baseline   WEIGHT BEARING RESTRICTIONS: No  FALLS:  Has patient fallen in last 6 months? No  LIVING ENVIRONMENT: 2 steps in the house and 2 spots around the house in various areas  OCCUPATION:  Retired   PLOF: Independent with community mobility with device  PATIENT GOALS: to walk better   NEXT MD VISIT: 6 months   OBJECTIVE:   DIAGNOSTIC FINDINGS: Post op: Hip intact per patient   PATIENT SURVEYS:   COGNITION: Overall cognitive status: Within functional limits for tasks assessed     SENSATION: Denies paresthesias   EDEMA:  None  MUSCLE LENGTH:   POSTURE: flexed posture  left knee valgus  PALPATION:   LOWER EXTREMITY ROM:  Active ROM Right eval Left eval  Hip flexion  WNL   Hip extension    Hip abduction    Hip adduction    Hip internal rotation    Hip external rotation    Knee flexion  Painful end range   Knee extension    Ankle dorsiflexion    Ankle plantarflexion    Ankle inversion    Ankle eversion     (Blank rows = not tested)  LOWER EXTREMITY MMT:  MMT Right eval Left eval  Hip flexion 13.9 10  Hip extension    Hip abduction 16.8 11.4  Hip adduction    Hip internal rotation    Hip external rotation    Knee  flexion    Knee extension 21.7 19  Ankle dorsiflexion    Ankle plantarflexion    Ankle inversion    Ankle eversion     (Blank rows = not tested)  ER/IR WFL Right 90 Shoulder flexion Left 120 shoulder flexion     FUNCTIONAL TESTS:  Will perform 5x sit tostand test next visit.  Sit to stand: uses hands   GAIT: Signifcant left trendelenburgh gait   TODAY'S TREATMENT:                                                                                                                              DATE:     5/15 Ex bike: baseline 80 HR 124 SAQ 2x20 3lbs  LAQ 2x10 1.5 lbs   Narrow base of support EO and EC  Tandem stance on air-ex 2x30 sec hold   Step up 2x10 2 inch RPE of7-8 on the left today so limited reps perfroemd.   Supine march 1st set no weights RPE of 2/10     12/13/22 Pt seen for aquatic therapy today.  Treatment took place in water 3.5-4.75 ft in depth at the [facility] pool. Temp of water was 91.  Pt entered/exited the pool via stairs and with hand rail.  Donned ankle fins Cycling on noodle Core strengthening: solid noodle pull downs in wide stance and alternating stance x10-12 reps ea  - sup suspension with yellow noodle: upper and lower abd ex : knees to chest. Trialed core rotation (unsuccessful due to pt avoiding crossing midline with LLE) 6 inch step ups bottom step 3.6 ft difficult; completed on water step in 4 ft with more success. Sitting balance and core engagement sitting on noodle  Pt requires the buoyancy and hydrostatic pressure of water for support, and to offload joints by unweighting joint load by at least 50 % in navel deep water and by at least 75-80% in chest to neck deep water.  Viscosity of the water is needed for resistance of strengthening. Water current perturbations provides challenge to standing balance requiring increased core activation.    5/10 Ex bike: baseline 88 HR 94  SAQ 3x15 2.5  LAQ 2x10 1.5 lbs    Step up  2x10 2 inch Patients RPE reached 6 on second set   Supine march 1st set no weights RPE of 2/10   Supine march 1lb RPE of 6/10   5/6 Ex bike: baseline 88 HR 94  SAQ 3x15 2.5lbs RPE of 6   Hip abduction with graded pressure 2x10   LAQ 2x10 1.5   All exercies kept at an RPE of 5-6      5/3 Nu-step baseline HR 81 6 min L3 89 after treatment. PPT x15   Bridge with 1-2 sec hold 3x6   SAQ 3x15 2lbs   Wand flexion 2x10 2lbs  Pain in left shoulder on last rep   Standing hip flexion left 2x15 1 lbs  Standing hip extension 2x15 1 lbs           4/30 Quad sets 2x10 each SAQ 3x10 2lbs  PPT x20  1/4 bridge 2x15   Air-ex narrow base EO and EC 2x30 sec each   Tandem stance L and right 2x30 sec each with eyes closed min a with right leg back    4/24  Quad sets 2x10 each SAQ 3x10 2lbs Abduction hip isometrics 3x15 sec hold LAQ 3x10 2lbs Standing weight shift 2x10  Seated hip abduction with band 2x10   Air-ex narrow base EO and EC 2x30 sec each   Tandem stance L and right 2x30 sec each with eyes closed min a with right leg back   Eval  Exercises - Standing Hip Abduction with Counter Support  - 1 x daily - 7 x weekly - 3 sets - 10 reps - Standing Hip Extension with Counter Support  - 1 x daily - 7 x weekly - 3 sets - 10 reps - Standing Hip Flexion AROM  - 1 x daily - 7 x weekly - 3 sets - 10 reps - Shoulder External Rotation and Scapular Retraction with Resistance  - 1 x daily - 7 x weekly - 3 sets - 10 reps - Seated Shoulder Horizontal Abduction with Resistance  - 1 x daily - 7 x weekly - 3 sets - 10 reps  PATIENT EDUCATION:  Education details: HEP, symptom management  Person educated: Patient Education method: Explanation, Demonstration, Tactile cues, Verbal cues, and Handouts Education comprehension: verbalized understanding, returned demonstration, verbal cues required, tactile cues required, and needs further education  HOME EXERCISE PROGRAM: Access  Code: [email or web address] URL: [email or web address] Date: 11/11/2022 Prepared by: Lorayne Bender  Exercises - Standing Hip Abduction with Counter Support  - 1 x daily - 7 x weekly - 3 sets - 10 reps - Standing Hip Extension with Counter Support  - 1 x daily - 7 x weekly - 3 sets - 10 reps - Standing Hip Flexion AROM  - 1 x daily - 7 x weekly - 3 sets - 10 reps - Shoulder External Rotation and Scapular Retraction with Resistance  - 1 x daily - 7 x weekly - 3 sets - 10 reps - Seated Shoulder Horizontal Abduction with Resistance  - 1 x daily - 7 x weekly - 3 sets - 10 reps  ASSESSMENT:  CLINICAL IMPRESSION: The patient is making progress. We progressed the weights today. She reported it was challenging but not too much. She had a little more trouble with step ups with the left leg today. Therapy will perform a re-assessment next visit.    OBJECTIVE IMPAIRMENTS: Abnormal gait, decreased activity tolerance, decreased balance, decreased endurance, decreased knowledge of  use of DME, decreased mobility, difficulty walking, decreased ROM, decreased strength, increased fascial restrictions, increased muscle spasms, postural dysfunction, and pain.   ACTIVITY LIMITATIONS: carrying, lifting, bending, standing, squatting, stairs, transfers, and locomotion level  PARTICIPATION LIMITATIONS: meal prep, cleaning, laundry, driving, shopping, community activity, and yard work  PERSONAL FACTORS: 3+ comorbidities: RA ; multiple hip dislocations; right knee OA; bilateral RTC tears   are also affecting patient's functional outcome.   REHAB POTENTIAL: Good  CLINICAL DECISION MAKING: Evolving/moderate complexity  EVALUATION COMPLEXITY: Moderate   GOALS: Goals reviewed with patient? Yes   SHORT TERM GOALS: Target date: 12/09/2022   Patient will be independent with basic HEP for hip strengthening with no pain or instability  Baseline: Goal status: INITIAL  2.  Patient will increase gross bilateral hip  strength by 5 pounds Baseline:  Goal status: INITIAL  3.  Patient will ambulate 500 feet with upright standing posture and minimal pain with least restrictive assistive device Baseline:  Goal status: INITIAL   LONG TERM GOALS: Target date: 01/07/2023    Patient will return to swimming with minimal pain Baseline:  Goal status: INITIAL  2.  Patient will ambulate community distances with least restrictive assistive device and good posture Baseline:  Goal status: INITIAL  3.  Patient will be independent with complete program for core stability, posture, and to maximize potential to increase gait distance Baseline:  Goal status: INITIAL    PLAN:  PT FREQUENCY: 2x/week  PT DURATION: 8 weeks  PLANNED INTERVENTIONS: Therapeutic exercises, Therapeutic activity, Neuromuscular re-education, Balance training, Gait training, Patient/Family education, Self Care, Joint mobilization (of the shoulder), Stair training, Aquatic Therapy, Dry Needling, Cryotherapy, Moist heat, Taping, and Manual therapy  PLAN FOR NEXT SESSION: Aquatics: Work on gait stability, bilateral hip strengthening, core stability Land: Develop base HEP patient has pain lying supine in her back focus more on standing exercises if tolerated.  Get patient on exercise bike and/or NuStep for self guided gym program   Geni Bers, PT 12/13/2022, 1:03 PM

## 2022-12-20 ENCOUNTER — Ambulatory Visit (HOSPITAL_BASED_OUTPATIENT_CLINIC_OR_DEPARTMENT_OTHER): Payer: Medicare Other | Admitting: Physical Therapy

## 2022-12-21 DIAGNOSIS — M4316 Spondylolisthesis, lumbar region: Secondary | ICD-10-CM | POA: Diagnosis not present

## 2022-12-21 DIAGNOSIS — M5136 Other intervertebral disc degeneration, lumbar region: Secondary | ICD-10-CM | POA: Diagnosis not present

## 2022-12-21 DIAGNOSIS — M48061 Spinal stenosis, lumbar region without neurogenic claudication: Secondary | ICD-10-CM | POA: Diagnosis not present

## 2022-12-21 DIAGNOSIS — M47816 Spondylosis without myelopathy or radiculopathy, lumbar region: Secondary | ICD-10-CM | POA: Diagnosis not present

## 2022-12-21 DIAGNOSIS — M419 Scoliosis, unspecified: Secondary | ICD-10-CM | POA: Diagnosis not present

## 2022-12-21 DIAGNOSIS — M4807 Spinal stenosis, lumbosacral region: Secondary | ICD-10-CM | POA: Diagnosis not present

## 2022-12-22 ENCOUNTER — Encounter (HOSPITAL_BASED_OUTPATIENT_CLINIC_OR_DEPARTMENT_OTHER): Payer: Self-pay | Admitting: Physical Therapy

## 2022-12-22 ENCOUNTER — Ambulatory Visit (HOSPITAL_BASED_OUTPATIENT_CLINIC_OR_DEPARTMENT_OTHER): Payer: Medicare Other | Admitting: Physical Therapy

## 2022-12-22 DIAGNOSIS — M25511 Pain in right shoulder: Secondary | ICD-10-CM | POA: Diagnosis not present

## 2022-12-22 DIAGNOSIS — M25551 Pain in right hip: Secondary | ICD-10-CM | POA: Diagnosis not present

## 2022-12-22 DIAGNOSIS — M25552 Pain in left hip: Secondary | ICD-10-CM | POA: Diagnosis not present

## 2022-12-22 DIAGNOSIS — G8929 Other chronic pain: Secondary | ICD-10-CM | POA: Diagnosis not present

## 2022-12-22 DIAGNOSIS — M545 Low back pain, unspecified: Secondary | ICD-10-CM | POA: Diagnosis not present

## 2022-12-22 DIAGNOSIS — R2689 Other abnormalities of gait and mobility: Secondary | ICD-10-CM | POA: Diagnosis not present

## 2022-12-22 NOTE — Therapy (Unsigned)
OUTPATIENT PHYSICAL THERAPY LOWER EXTREMITY Progress Note     Patient Name: Virginia Gordon MRN: [id number] DOB:1950/08/08, 71 y.o., female Today's Date: 12/22/2022  END OF SESSION:  PT End of Session - 12/22/22 1531     Visit Number 10    Number of Visits 16    Date for PT Re-Evaluation 01/07/23    PT Start Time 1300    PT Stop Time 1344    PT Time Calculation (min) 44 min    Activity Tolerance Patient tolerated treatment well    Behavior During Therapy WFL for tasks assessed/performed              Past Medical History:  Diagnosis Date   Rheumatoid arthritis (HCC)    Past Surgical History:  Procedure Laterality Date   BREAST BIOPSY Left    pt not sure of date.   HIP SURGERY     SHOULDER SURGERY     Patient Active Problem List   Diagnosis Date Noted   Effusion of left knee joint 08/17/2021   ADD (attention deficit disorder) 11/13/2015   Connective tissue disease overlap syndrome (HCC) 11/13/2015   Menopausal and perimenopausal disorder 11/13/2015   Acne erythematosa 11/13/2015   Disordered sleep 11/13/2015   History of operative procedure on hip 07/03/2015   Left knee pain 05/20/2014   Right foot pain 05/14/2014   Bilateral shoulder pain 05/14/2014   Hypothyroidism 03/08/2014   Degenerative arthritis of hip 03/07/2012   Progress Note Reporting Period 11/11/2022 to 12/22/2022  See note below for Objective Data and Assessment of Progress/Goals.      PCP: Maple Hudson MD   REFERRING PROVIDER: Dr Nancy Fetter  REFERRING DIAG: [phone] (ICD-10-CM) - Presence of left artificial hip joint   THERAPY DIAG:  Chronic bilateral low back pain without sciatica  Pain in right hip  Pain in left hip  Chronic right shoulder pain  Other abnormalities of gait and mobility  Rationale for Evaluation and Treatment: Rehabilitation  ONSET DATE:   SUBJECTIVE: 4/24 was the original revision surgery. long history of hip dislocations   SUBJECTIVE STATEMENT: The  patient reports she worked hard int he pool. Overall she is tolerating the exercises that have been given to her well.    PERTINENT HISTORY: Right shoulder pain; right knee pain; Right Knee OA ; RA; connection tissue disease overlap syndrome. Charcot Foot   PAIN:  Are you having pain? Yes: NPRS scale: 8/10 Pain location: left pelvis area  Pain description: aching  Aggravating factors: swimming; waking without support   Relieving factors: not doing things that   Are you having pain? Yes: NPRS scale: 8-9/10 at worst  Pain location: right shoulder  Pain description: aching  Aggravating factors: swimming; = Relieving factors: not doing things that   Are you having pain? Yes: NPRS scale: 8-9/10 at worst 2-3  Pain location: right shoulder  Pain description: aching  Aggravating factors: swimming; waking without support   Relieving factors: not doing things that     PRECAUTIONS: Other: hip : do not cross baseline   WEIGHT BEARING RESTRICTIONS: No  FALLS:  Has patient fallen in last 6 months? No  LIVING ENVIRONMENT: 2 steps in the house and 2 spots around the house in various areas  OCCUPATION:  Retired   PLOF: Independent with community mobility with device  PATIENT GOALS: to walk better   NEXT MD VISIT: 6 months   OBJECTIVE:   DIAGNOSTIC FINDINGS: Post op: Hip intact per patient   PATIENT SURVEYS:  COGNITION: Overall cognitive status: Within functional limits for tasks assessed     SENSATION: Denies paresthesias   EDEMA:  None  MUSCLE LENGTH:   POSTURE: flexed posture left knee valgus  PALPATION:   LOWER EXTREMITY ROM:  Active ROM Right eval Left eval  Hip flexion  WNL   Hip extension    Hip abduction    Hip adduction    Hip internal rotation    Hip external rotation    Knee flexion  Painful end range   Knee extension    Ankle dorsiflexion    Ankle plantarflexion    Ankle inversion    Ankle eversion     (Blank rows = not tested)  LOWER  EXTREMITY MMT:  MMT Right eval Left eval Right  left  Hip flexion 13.9 10  11.6  Hip extension      Hip abduction 16.8 11.4  11  Hip adduction      Hip internal rotation      Hip external rotation      Knee flexion      Knee extension 21.7 19  20.3  Ankle dorsiflexion      Ankle plantarflexion      Ankle inversion      Ankle eversion       (Blank rows = not tested)  ER/IR WFL Right 90 Shoulder flexion Left 120 shoulder flexion     FUNCTIONAL TESTS:  Will perform 5x sit tostand test next visit.  Sit to stand: uses hands   GAIT: Signifcant left trendelenburgh gait   TODAY'S TREATMENT:                                                                                                                              DATE:   5/22 Ex bike 7 min   SAQ 2x20 3lbs  5/15 Ex bike: baseline 80 HR 124 SAQ 2x20 3lbs  LAQ 2x10  4 lbs  lbs   Narrow base of support EO and EC  Tandem stance on air-ex 2x30 sec hold   Gait analysis: reviewed gait in slow motion.   12/13/22 Pt seen for aquatic therapy today.  Treatment took place in water 3.5-4.75 ft in depth at the [facility] pool. Temp of water was 91.  Pt entered/exited the pool via stairs and with hand rail.  Donned ankle fins Cycling on noodle Core strengthening: solid noodle pull downs in wide stance and alternating stance x10-12 reps ea  - sup suspension with yellow noodle: upper and lower abd ex : knees to chest. Trialed core rotation (unsuccessful due to pt avoiding crossing midline with LLE) 6 inch step ups bottom step 3.6 ft difficult; completed on water step in 4 ft with more success. Sitting balance and core engagement sitting on noodle  Pt requires the buoyancy and hydrostatic pressure of water for support, and to offload joints by unweighting joint load by at least 50 % in navel deep water and  by at least 75-80% in chest to neck deep water.  Viscosity of the water is needed for resistance of strengthening.  Water current perturbations provides challenge to standing balance requiring increased core activation.    5/10 Ex bike: baseline 88 HR 94  SAQ 3x15 2.5   LAQ 2x10 1.5 lbs    Step up 2x10 2 inch Patients RPE reached 6 on second set   Supine march 1st set no weights RPE of 2/10   Supine march 1lb RPE of 6/10   5/6 Ex bike: baseline 88 HR 94  SAQ 3x15 2.5lbs RPE of 6   Hip abduction with graded pressure 2x10   LAQ 2x10 1.5   All exercies kept at an RPE of 5-6      Eval  Exercises - Standing Hip Abduction with Counter Support  - 1 x daily - 7 x weekly - 3 sets - 10 reps - Standing Hip Extension with Counter Support  - 1 x daily - 7 x weekly - 3 sets - 10 reps - Standing Hip Flexion AROM  - 1 x daily - 7 x weekly - 3 sets - 10 reps - Shoulder External Rotation and Scapular Retraction with Resistance  - 1 x daily - 7 x weekly - 3 sets - 10 reps - Seated Shoulder Horizontal Abduction with Resistance  - 1 x daily - 7 x weekly - 3 sets - 10 reps  PATIENT EDUCATION:  Education details: HEP, symptom management  Person educated: Patient Education method: Explanation, Demonstration, Tactile cues, Verbal cues, and Handouts Education comprehension: verbalized understanding, returned demonstration, verbal cues required, tactile cues required, and needs further education  HOME EXERCISE PROGRAM: Access Code: [email or web address] URL: [email or web address] Date: 11/11/2022 Prepared by: Lorayne Bender  Exercises - Standing Hip Abduction with Counter Support  - 1 x daily - 7 x weekly - 3 sets - 10 reps - Standing Hip Extension with Counter Support  - 1 x daily - 7 x weekly - 3 sets - 10 reps - Standing Hip Flexion AROM  - 1 x daily - 7 x weekly - 3 sets - 10 reps - Shoulder External Rotation and Scapular Retraction with Resistance  - 1 x daily - 7 x weekly - 3 sets - 10 reps - Seated Shoulder Horizontal Abduction with Resistance  - 1 x daily - 7 x weekly - 3 sets - 10  reps  ASSESSMENT:  CLINICAL IMPRESSION: The patient is making some progress. She is back in the pool and able to perform a pool program We did a slow motion gait analysis of the hip today. She appears to be dropping less. We will compare again in a few weeks. She is walking with a very narrow base of support. Her left knee is valgus in weight bearing. She tolerated there-ex well. We have been able to consitently progress her weight with activity.   OBJECTIVE IMPAIRMENTS: Abnormal gait, decreased activity tolerance, decreased balance, decreased endurance, decreased knowledge of use of DME, decreased mobility, difficulty walking, decreased ROM, decreased strength, increased fascial restrictions, increased muscle spasms, postural dysfunction, and pain.   ACTIVITY LIMITATIONS: carrying, lifting, bending, standing, squatting, stairs, transfers, and locomotion level  PARTICIPATION LIMITATIONS: meal prep, cleaning, laundry, driving, shopping, community activity, and yard work  PERSONAL FACTORS: 3+ comorbidities: RA ; multiple hip dislocations; right knee OA; bilateral RTC tears   are also affecting patient's functional outcome.   REHAB POTENTIAL: Good  CLINICAL DECISION MAKING: Evolving/moderate complexity  EVALUATION COMPLEXITY: Moderate  GOALS: Goals reviewed with patient? Yes   SHORT TERM GOALS: Target date: 12/09/2022   Patient will be independent with basic HEP for hip strengthening with no pain or instability  Baseline: Goal status: INITIAL  2.  Patient will increase gross bilateral hip strength by 5 pounds Baseline:  Goal status: INITIAL  3.  Patient will ambulate 500 feet with upright standing posture and minimal pain with least restrictive assistive device Baseline:  Goal status: INITIAL   LONG TERM GOALS: Target date: 01/07/2023    Patient will return to swimming with minimal pain Baseline:  Goal status: INITIAL  2.  Patient will ambulate community distances with least  restrictive assistive device and good posture Baseline:  Goal status: INITIAL  3.  Patient will be independent with complete program for core stability, posture, and to maximize potential to increase gait distance Baseline:  Goal status: INITIAL    PLAN:  PT FREQUENCY: 2x/week  PT DURATION: 8 weeks  PLANNED INTERVENTIONS: Therapeutic exercises, Therapeutic activity, Neuromuscular re-education, Balance training, Gait training, Patient/Family education, Self Care, Joint mobilization (of the shoulder), Stair training, Aquatic Therapy, Dry Needling, Cryotherapy, Moist heat, Taping, and Manual therapy  PLAN FOR NEXT SESSION: Aquatics: Work on gait stability, bilateral hip strengthening, core stability Land: Develop base HEP patient has pain lying supine in her back focus more on standing exercises if tolerated.  Get patient on exercise bike and/or NuStep for self guided gym program   Dessie Coma, PT 12/22/2022, 4:23 PM

## 2022-12-28 ENCOUNTER — Encounter (HOSPITAL_BASED_OUTPATIENT_CLINIC_OR_DEPARTMENT_OTHER): Payer: Self-pay | Admitting: Physical Therapy

## 2022-12-29 ENCOUNTER — Encounter (HOSPITAL_BASED_OUTPATIENT_CLINIC_OR_DEPARTMENT_OTHER): Payer: Self-pay | Admitting: Physical Therapy

## 2022-12-29 ENCOUNTER — Ambulatory Visit (HOSPITAL_BASED_OUTPATIENT_CLINIC_OR_DEPARTMENT_OTHER): Payer: Medicare Other | Admitting: Physical Therapy

## 2022-12-29 DIAGNOSIS — G8929 Other chronic pain: Secondary | ICD-10-CM | POA: Diagnosis not present

## 2022-12-29 DIAGNOSIS — M25551 Pain in right hip: Secondary | ICD-10-CM

## 2022-12-29 DIAGNOSIS — M545 Low back pain, unspecified: Secondary | ICD-10-CM | POA: Diagnosis not present

## 2022-12-29 DIAGNOSIS — M25552 Pain in left hip: Secondary | ICD-10-CM

## 2022-12-29 DIAGNOSIS — M25511 Pain in right shoulder: Secondary | ICD-10-CM | POA: Diagnosis not present

## 2022-12-29 DIAGNOSIS — R2689 Other abnormalities of gait and mobility: Secondary | ICD-10-CM | POA: Diagnosis not present

## 2022-12-29 NOTE — Therapy (Signed)
OUTPATIENT PHYSICAL THERAPY LOWER EXTREMITY Progress Note     Patient Name: Virginia Gordon MRN: [id number] DOB:Jul 02, 1951, 72 y.o., female Today's Date: 12/29/2022  END OF SESSION:  PT End of Session - 12/29/22 1120     Visit Number 11    Number of Visits 16    Date for PT Re-Evaluation 01/07/23    PT Start Time 1032    PT Stop Time 1115    PT Time Calculation (min) 43 min    Activity Tolerance Patient tolerated treatment well    Behavior During Therapy WFL for tasks assessed/performed               Past Medical History:  Diagnosis Date   Rheumatoid arthritis (HCC)    Past Surgical History:  Procedure Laterality Date   BREAST BIOPSY Left    pt not sure of date.   HIP SURGERY     SHOULDER SURGERY     Patient Active Problem List   Diagnosis Date Noted   Effusion of left knee joint 08/17/2021   ADD (attention deficit disorder) 11/13/2015   Connective tissue disease overlap syndrome (HCC) 11/13/2015   Menopausal and perimenopausal disorder 11/13/2015   Acne erythematosa 11/13/2015   Disordered sleep 11/13/2015   History of operative procedure on hip 07/03/2015   Left knee pain 05/20/2014   Right foot pain 05/14/2014   Bilateral shoulder pain 05/14/2014   Hypothyroidism 03/08/2014   Degenerative arthritis of hip 03/07/2012    PCP: Maple Hudson MD   REFERRING PROVIDER: Dr Nancy Fetter  REFERRING DIAG: [phone] (ICD-10-CM) - Presence of left artificial hip joint   THERAPY DIAG:  Chronic bilateral low back pain without sciatica  Pain in right hip  Pain in left hip  Rationale for Evaluation and Treatment: Rehabilitation  ONSET DATE:   SUBJECTIVE: 4/24 was the original revision surgery. long history of hip dislocations   SUBJECTIVE STATEMENT: Pt reports compliance with Aquatic HEP.   PERTINENT HISTORY: Right shoulder pain; right knee pain; Right Knee OA ; RA; connection tissue disease overlap syndrome. Charcot Foot   PAIN:  Are you having pain?  Yes: NPRS scale: 8/10 Pain location: left pelvis area  Pain description: aching  Aggravating factors: swimming; waking without support   Relieving factors: not doing things that   Are you having pain? Yes: NPRS scale: 8-9/10 at worst  Pain location: right shoulder  Pain description: aching  Aggravating factors: swimming; = Relieving factors: not doing things that   Are you having pain? Yes: NPRS scale: 8-9/10 at worst 2-3  Pain location: right shoulder  Pain description: aching  Aggravating factors: swimming; waking without support   Relieving factors: not doing things that     PRECAUTIONS: Other: hip : do not cross baseline   WEIGHT BEARING RESTRICTIONS: No  FALLS:  Has patient fallen in last 6 months? No  LIVING ENVIRONMENT: 2 steps in the house and 2 spots around the house in various areas  OCCUPATION:  Retired   PLOF: Independent with community mobility with device  PATIENT GOALS: to walk better   NEXT MD VISIT: 6 months   OBJECTIVE:   DIAGNOSTIC FINDINGS: Post op: Hip intact per patient   PATIENT SURVEYS:   COGNITION: Overall cognitive status: Within functional limits for tasks assessed     SENSATION: Denies paresthesias   EDEMA:  None  MUSCLE LENGTH:   POSTURE: flexed posture left knee valgus  PALPATION:   LOWER EXTREMITY ROM:  Active ROM Right eval Left eval  Hip  flexion  WNL   Hip extension    Hip abduction    Hip adduction    Hip internal rotation    Hip external rotation    Knee flexion  Painful end range   Knee extension    Ankle dorsiflexion    Ankle plantarflexion    Ankle inversion    Ankle eversion     (Blank rows = not tested)  LOWER EXTREMITY MMT:  MMT Right eval Left eval Right  left  Hip flexion 13.9 10  11.6  Hip extension      Hip abduction 16.8 11.4  11  Hip adduction      Hip internal rotation      Hip external rotation      Knee flexion      Knee extension 21.7 19  20.3  Ankle dorsiflexion      Ankle  plantarflexion      Ankle inversion      Ankle eversion       (Blank rows = not tested)  ER/IR WFL Right 90 Shoulder flexion Left 120 shoulder flexion     FUNCTIONAL TESTS:  Will perform 5x sit tostand test next visit.  Sit to stand: uses hands   GAIT: Signifcant left trendelenburgh gait   TODAY'S TREATMENT:                                                                                                                              DATE:   12/29/22  Pt seen for aquatic therapy today.  Treatment took place in water 3.5-4.75 ft in depth at the [facility] pool. Temp of water was 91.  Pt entered/exited the pool via stairs and with hand rail.   Cycling on noodle *suspended sup on noodle abd curls 2x10 *Sitting balance on yellow noodle: ue lifted out of water  - pushing yellow noddle down with knees to chest for added core engagement *TrA sets: solid noodle pull downs in wide stance and alternating stance x10-12 reps ea *6 inch step ups bottom step 3.6 ft R/L *plank using yellow noodle/arm pull downs; suspended plank  Pt requires the buoyancy and hydrostatic pressure of water for support, and to offload joints by unweighting joint load by at least 50 % in navel deep water and by at least 75-80% in chest to neck deep water.  Viscosity of the water is needed for resistance of strengthening. Water current perturbations provides challenge to standing balance requiring increased core activation.   5/22 Ex bike 7 min   SAQ 2x20 3lbs  5/15 Ex bike: baseline 80 HR 124 SAQ 2x20 3lbs  LAQ 2x10  4 lbs  lbs   Narrow base of support EO and EC  Tandem stance on air-ex 2x30 sec hold   Gait analysis: reviewed gait in slow motion.   12/13/22 Pt seen for aquatic therapy today.  Treatment took place in water 3.5-4.75 ft in depth at the [facility] pool.  Temp of water was 91.  Pt entered/exited the pool via stairs and with hand rail.  Donned ankle fins Cycling on  noodle Core strengthening: solid noodle pull downs in wide stance and alternating stance x10-12 reps ea  - sup suspension with yellow noodle: upper and lower abd ex : knees to chest. Trialed core rotation (unsuccessful due to pt avoiding crossing midline with LLE) 6 inch step ups bottom step 3.6 ft difficult; completed on water step in 4 ft with more success. Sitting balance and core engagement sitting on noodle  Pt requires the buoyancy and hydrostatic pressure of water for support, and to offload joints by unweighting joint load by at least 50 % in navel deep water and by at least 75-80% in chest to neck deep water.  Viscosity of the water is needed for resistance of strengthening. Water current perturbations provides challenge to standing balance requiring increased core activation.    5/10 Ex bike: baseline 88 HR 94  SAQ 3x15 2.5   LAQ 2x10 1.5 lbs    Step up 2x10 2 inch Patients RPE reached 6 on second set   Supine march 1st set no weights RPE of 2/10   Supine march 1lb RPE of 6/10   5/6 Ex bike: baseline 88 HR 94  SAQ 3x15 2.5lbs RPE of 6   Hip abduction with graded pressure 2x10   LAQ 2x10 1.5   All exercies kept at an RPE of 5-6      Eval  Exercises - Standing Hip Abduction with Counter Support  - 1 x daily - 7 x weekly - 3 sets - 10 reps - Standing Hip Extension with Counter Support  - 1 x daily - 7 x weekly - 3 sets - 10 reps - Standing Hip Flexion AROM  - 1 x daily - 7 x weekly - 3 sets - 10 reps - Shoulder External Rotation and Scapular Retraction with Resistance  - 1 x daily - 7 x weekly - 3 sets - 10 reps - Seated Shoulder Horizontal Abduction with Resistance  - 1 x daily - 7 x weekly - 3 sets - 10 reps  PATIENT EDUCATION:  Education details: HEP, symptom management  Person educated: Patient Education method: Explanation, Demonstration, Tactile cues, Verbal cues, and Handouts Education comprehension: verbalized understanding, returned demonstration,  verbal cues required, tactile cues required, and needs further education  HOME EXERCISE PROGRAM: Access Code: [email or web address] URL: [email or web address] Date: 11/11/2022 Prepared by: Lorayne Bender  Exercises - Standing Hip Abduction with Counter Support  - 1 x daily - 7 x weekly - 3 sets - 10 reps - Standing Hip Extension with Counter Support  - 1 x daily - 7 x weekly - 3 sets - 10 reps - Standing Hip Flexion AROM  - 1 x daily - 7 x weekly - 3 sets - 10 reps - Shoulder External Rotation and Scapular Retraction with Resistance  - 1 x daily - 7 x weekly - 3 sets - 10 reps - Seated Shoulder Horizontal Abduction with Resistance  - 1 x daily - 7 x weekly - 3 sets - 10 reps  ASSESSMENT:  CLINICAL IMPRESSION: Pt directed through final aquatic HEP.  Some review of exercies completed from last aquatic HEP.  She has excellent execution of all without pain.  She is indep with HEP and has reached her max potential in setting.  She will continue with land based intervention.   OBJECTIVE IMPAIRMENTS: Abnormal gait, decreased activity tolerance, decreased balance, decreased endurance,  decreased knowledge of use of DME, decreased mobility, difficulty walking, decreased ROM, decreased strength, increased fascial restrictions, increased muscle spasms, postural dysfunction, and pain.   ACTIVITY LIMITATIONS: carrying, lifting, bending, standing, squatting, stairs, transfers, and locomotion level  PARTICIPATION LIMITATIONS: meal prep, cleaning, laundry, driving, shopping, community activity, and yard work  PERSONAL FACTORS: 3+ comorbidities: RA ; multiple hip dislocations; right knee OA; bilateral RTC tears   are also affecting patient's functional outcome.   REHAB POTENTIAL: Good  CLINICAL DECISION MAKING: Evolving/moderate complexity  EVALUATION COMPLEXITY: Moderate   GOALS: Goals reviewed with patient? Yes   SHORT TERM GOALS: Target date: 12/09/2022   Patient will be independent with  basic HEP for hip strengthening with no pain or instability  Baseline: Goal status: Ongoing (Met with aquatics) 12/29/22  2.  Patient will increase gross bilateral hip strength by 5 pounds Baseline:  Goal status: INITIAL  3.  Patient will ambulate 500 feet with upright standing posture and minimal pain with least restrictive assistive device Baseline:  Goal status: INITIAL   LONG TERM GOALS: Target date: 01/07/2023    Patient will return to swimming with minimal pain Baseline:  Goal status: INITIAL  2.  Patient will ambulate community distances with least restrictive assistive device and good posture Baseline:  Goal status: INITIAL  3.  Patient will be independent with complete program for core stability, posture, and to maximize potential to increase gait distance Baseline:  Goal status: INITIAL    PLAN:  PT FREQUENCY: 2x/week  PT DURATION: 8 weeks  PLANNED INTERVENTIONS: Therapeutic exercises, Therapeutic activity, Neuromuscular re-education, Balance training, Gait training, Patient/Family education, Self Care, Joint mobilization (of the shoulder), Stair training, Aquatic Therapy, Dry Needling, Cryotherapy, Moist heat, Taping, and Manual therapy  PLAN FOR NEXT SESSION: Aquatics: Work on gait stability, bilateral hip strengthening, core stability Land: Develop base HEP patient has pain lying supine in her back focus more on standing exercises if tolerated.  Get patient on exercise bike and/or NuStep for self guided gym program  Corrie Dandy Tomma Lightning) Chantille Navarrete MPT 12/29/2022, 11:21 AM

## 2022-12-31 ENCOUNTER — Encounter (HOSPITAL_BASED_OUTPATIENT_CLINIC_OR_DEPARTMENT_OTHER): Payer: Self-pay

## 2022-12-31 ENCOUNTER — Encounter (HOSPITAL_BASED_OUTPATIENT_CLINIC_OR_DEPARTMENT_OTHER): Payer: Medicare Other | Admitting: Physical Therapy

## 2023-01-04 DIAGNOSIS — M47816 Spondylosis without myelopathy or radiculopathy, lumbar region: Secondary | ICD-10-CM | POA: Diagnosis not present

## 2023-01-07 ENCOUNTER — Encounter (HOSPITAL_BASED_OUTPATIENT_CLINIC_OR_DEPARTMENT_OTHER): Payer: Self-pay | Admitting: Physical Therapy

## 2023-01-07 ENCOUNTER — Ambulatory Visit (HOSPITAL_BASED_OUTPATIENT_CLINIC_OR_DEPARTMENT_OTHER): Payer: Medicare Other | Attending: Orthopedic Surgery | Admitting: Physical Therapy

## 2023-01-07 DIAGNOSIS — M545 Low back pain, unspecified: Secondary | ICD-10-CM | POA: Diagnosis not present

## 2023-01-07 DIAGNOSIS — M25552 Pain in left hip: Secondary | ICD-10-CM | POA: Diagnosis not present

## 2023-01-07 DIAGNOSIS — R2689 Other abnormalities of gait and mobility: Secondary | ICD-10-CM | POA: Diagnosis not present

## 2023-01-07 DIAGNOSIS — G8929 Other chronic pain: Secondary | ICD-10-CM | POA: Diagnosis not present

## 2023-01-07 DIAGNOSIS — M25511 Pain in right shoulder: Secondary | ICD-10-CM | POA: Insufficient documentation

## 2023-01-07 DIAGNOSIS — M25551 Pain in right hip: Secondary | ICD-10-CM | POA: Insufficient documentation

## 2023-01-07 NOTE — Therapy (Signed)
OUTPATIENT PHYSICAL THERAPY LOWER EXTREMITY Progress Note     Patient Name: Virginia Gordon MRN: [id number] DOB:26-Dec-1950, 72 y.o., female Today's Date: 01/07/2023  END OF SESSION:  PT End of Session - 01/07/23 1515     Visit Number 12    Number of Visits --    Date for PT Re-Evaluation 04/01/23    Authorization Type MCR [employer]    PT Start Time 1513    PT Stop Time 1603    PT Time Calculation (min) 50 min    Activity Tolerance Patient tolerated treatment well    Behavior During Therapy WFL for tasks assessed/performed                Past Medical History:  Diagnosis Date   Rheumatoid arthritis (HCC)    Past Surgical History:  Procedure Laterality Date   BREAST BIOPSY Left    pt not sure of date.   HIP SURGERY     SHOULDER SURGERY     Patient Active Problem List   Diagnosis Date Noted   Effusion of left knee joint 08/17/2021   ADD (attention deficit disorder) 11/13/2015   Connective tissue disease overlap syndrome (HCC) 11/13/2015   Menopausal and perimenopausal disorder 11/13/2015   Acne erythematosa 11/13/2015   Disordered sleep 11/13/2015   History of operative procedure on hip 07/03/2015   Left knee pain 05/20/2014   Right foot pain 05/14/2014   Bilateral shoulder pain 05/14/2014   Hypothyroidism 03/08/2014   Degenerative arthritis of hip 03/07/2012    PCP: Maple Hudson MD   REFERRING PROVIDER: Dr Nancy Fetter  REFERRING DIAG: [phone] (ICD-10-CM) - Presence of left artificial hip joint   THERAPY DIAG:  Chronic bilateral low back pain without sciatica  Pain in right hip  Pain in left hip  Rationale for Evaluation and Treatment: Rehabilitation  ONSET DATE:   SUBJECTIVE: 4/24 was the original revision surgery. long history of hip dislocations   SUBJECTIVE STATEMENT: I feel like we need to progress some lateral motions.    PERTINENT HISTORY: Right shoulder pain; right knee pain; Right Knee OA ; RA; connection tissue disease overlap syndrome.  Charcot Foot   PAIN:  Are you having pain? Yes: NPRS scale: 3-4/10 Pain location: right knee Pain description: grinding  Aggravating factors: swimming; waking without support   Relieving factors: not doing things that     PRECAUTIONS: Other: hip : do not cross baseline   WEIGHT BEARING RESTRICTIONS: No  FALLS:  Has patient fallen in last 6 months? No  LIVING ENVIRONMENT: 2 steps in the house and 2 spots around the house in various areas  OCCUPATION:  Retired   PLOF: Independent with community mobility with device  PATIENT GOALS: to walk better   NEXT MD VISIT: 6 months   OBJECTIVE:   DIAGNOSTIC FINDINGS: Post op: Hip intact per patient   POSTURE: flexed posture left knee valgus   LOWER EXTREMITY ROM:  Active ROM Right eval Left eval  Hip flexion  WNL   Hip extension    Hip abduction    Hip adduction    Hip internal rotation    Hip external rotation    Knee flexion  Painful end range   Knee extension    Ankle dorsiflexion    Ankle plantarflexion    Ankle inversion    Ankle eversion     (Blank rows = not tested)  LOWER EXTREMITY MMT:  MMT Right eval Left eval Right  left Rt/Lt (lb) 6/7  Hip flexion 13.9  10  11.6 16.6/25.8  Hip extension       Hip abduction 16.8 11.4  11 9.5 / 3-/5  Hip adduction       Hip internal rotation       Hip external rotation       Knee flexion       Knee extension 21.7 19  20.3 27.2/24.2  Ankle dorsiflexion       Ankle plantarflexion       Ankle inversion       Ankle eversion        (Blank rows = not tested)  ER/IR WFL Right 90 Shoulder flexion Left 120 shoulder flexion     FUNCTIONAL TESTS:  Will perform 5x sit tostand test next visit.  Sit to stand: uses hands   GAIT: Signifcant left trendelenburgh gait   TODAY'S TREATMENT:                                                                                                                              DATE:   Treatment                             6/7:  Sidelying clams- PT assist to lift Lt Hooklying clam- alt motion Gait- PT assisted to correct Lt trendelenburg Standing ball bw knees, upright/midline posture with walker, weight to heels Standing at bar- plank hip abd   12/29/22  Pt seen for aquatic therapy today.  Treatment took place in water 3.5-4.75 ft in depth at the [facility] pool. Temp of water was 91.  Pt entered/exited the pool via stairs and with hand rail.   Cycling on noodle *suspended sup on noodle abd curls 2x10 *Sitting balance on yellow noodle: ue lifted out of water  - pushing yellow noddle down with knees to chest for added core engagement *TrA sets: solid noodle pull downs in wide stance and alternating stance x10-12 reps ea *6 inch step ups bottom step 3.6 ft R/L *plank using yellow noodle/arm pull downs; suspended plank  Pt requires the buoyancy and hydrostatic pressure of water for support, and to offload joints by unweighting joint load by at least 50 % in navel deep water and by at least 75-80% in chest to neck deep water.  Viscosity of the water is needed for resistance of strengthening. Water current perturbations provides challenge to standing balance requiring increased core activation.   5/22 Ex bike 7 min   SAQ 2x20 3lbs  5/15 Ex bike: baseline 80 HR 124 SAQ 2x20 3lbs  LAQ 2x10  4 lbs  lbs   Narrow base of support EO and EC  Tandem stance on air-ex 2x30 sec hold   Gait analysis: reviewed gait in slow motion.     PATIENT EDUCATION:  Education details: HEP, symptom management  Person educated: Patient Education method: Explanation, Demonstration, Tactile cues, Verbal cues, and Handouts Education comprehension: verbalized understanding, returned  demonstration, verbal cues required, tactile cues required, and needs further education  HOME EXERCISE PROGRAM: Access Code: [email or web address] URL: [email or web address]   ASSESSMENT:  CLINICAL IMPRESSION: Pt is able  to stand in upright posture with proper alignment with ball bw knees and reported feeling signficant fatigue in bil hips as expected. Explained that working on midline activation for abd group strengthening will be necessary to improve lateral movement control. Will cont to benefit from skilled PT to address strength limitations in order to improve functional ambulation.    OBJECTIVE IMPAIRMENTS: Abnormal gait, decreased activity tolerance, decreased balance, decreased endurance, decreased knowledge of use of DME, decreased mobility, difficulty walking, decreased ROM, decreased strength, increased fascial restrictions, increased muscle spasms, postural dysfunction, and pain.   ACTIVITY LIMITATIONS: carrying, lifting, bending, standing, squatting, stairs, transfers, and locomotion level  PARTICIPATION LIMITATIONS: meal prep, cleaning, laundry, driving, shopping, community activity, and yard work  PERSONAL FACTORS: 3+ comorbidities: RA ; multiple hip dislocations; right knee OA; bilateral RTC tears   are also affecting patient's functional outcome.   REHAB POTENTIAL: Good  CLINICAL DECISION MAKING: Evolving/moderate complexity  EVALUATION COMPLEXITY: Moderate   GOALS: Goals reviewed with patient? Yes   SHORT TERM GOALS: Target date: 12/09/2022   Patient will be independent with basic HEP for hip strengthening with no pain or instability  Baseline: Goal status: Ongoing (Met with aquatics) 12/29/22  2.  Patient will increase gross bilateral hip strength by 5 pounds Baseline:  Goal status: partially met, unable to lift Rt Lt in abd against gravity  3.  Patient will ambulate 500 feet with upright standing posture and minimal pain with least restrictive assistive device Baseline:  Goal status: ongoing   LONG TERM GOALS: Target date: POC date    Patient will return to swimming with minimal pain Baseline:  Goal status:achieved  2.  Patient will ambulate community distances with least  restrictive assistive device and good posture Baseline:  Goal status: INITIAL  3.  Patient will be independent with complete program for core stability, posture, and to maximize potential to increase gait distance Baseline:  Goal status: INITIAL  4.  Able to ambulate without valgus support through knees Baseline:  Goal status: INITIAL    PLAN:  PT FREQUENCY: 2x/week  PT DURATION: 8 weeks  PLANNED INTERVENTIONS: Therapeutic exercises, Therapeutic activity, Neuromuscular re-education, Balance training, Gait training, Patient/Family education, Self Care, Joint mobilization (of the shoulder), Stair training, Aquatic Therapy, Dry Needling, Cryotherapy, Moist heat, Taping, and Manual therapy  PLAN FOR NEXT SESSION: Aquatics: Work on gait stability, bilateral hip strengthening, core stability Land: Develop base HEP patient has pain lying supine in her back focus more on standing exercises if tolerated.  Get patient on exercise bike and/or NuStep for self guided gym program  Dearies Meikle C. Elisa Kutner PT, DPT 01/07/23 4:56 PM

## 2023-01-14 ENCOUNTER — Ambulatory Visit (HOSPITAL_BASED_OUTPATIENT_CLINIC_OR_DEPARTMENT_OTHER): Payer: Medicare Other | Admitting: Physical Therapy

## 2023-01-14 DIAGNOSIS — M25552 Pain in left hip: Secondary | ICD-10-CM | POA: Diagnosis not present

## 2023-01-14 DIAGNOSIS — M25511 Pain in right shoulder: Secondary | ICD-10-CM | POA: Diagnosis not present

## 2023-01-14 DIAGNOSIS — R2689 Other abnormalities of gait and mobility: Secondary | ICD-10-CM

## 2023-01-14 DIAGNOSIS — M25551 Pain in right hip: Secondary | ICD-10-CM

## 2023-01-14 DIAGNOSIS — G8929 Other chronic pain: Secondary | ICD-10-CM

## 2023-01-14 DIAGNOSIS — M545 Low back pain, unspecified: Secondary | ICD-10-CM | POA: Diagnosis not present

## 2023-01-14 NOTE — Therapy (Signed)
OUTPATIENT PHYSICAL THERAPY LOWER EXTREMITY Progress Note     Patient Name: Virginia Gordon MRN: [id number] DOB:Apr 13, 1951, 72 y.o., female Today's Date: 01/14/2023  END OF SESSION:  PT End of Session - 01/14/23 1407     Visit Number 13    Number of Visits 29    Date for PT Re-Evaluation 04/01/23    Authorization Type MCR USAA    PT Start Time 1300    PT Stop Time 1343    PT Time Calculation (min) 43 min    Activity Tolerance Patient tolerated treatment well    Behavior During Therapy WFL for tasks assessed/performed                 Past Medical History:  Diagnosis Date   Rheumatoid arthritis (HCC)    Past Surgical History:  Procedure Laterality Date   BREAST BIOPSY Left    pt not sure of date.   HIP SURGERY     SHOULDER SURGERY     Patient Active Problem List   Diagnosis Date Noted   Effusion of left knee joint 08/17/2021   ADD (attention deficit disorder) 11/13/2015   Connective tissue disease overlap syndrome (HCC) 11/13/2015   Menopausal and perimenopausal disorder 11/13/2015   Acne erythematosa 11/13/2015   Disordered sleep 11/13/2015   History of operative procedure on hip 07/03/2015   Left knee pain 05/20/2014   Right foot pain 05/14/2014   Bilateral shoulder pain 05/14/2014   Hypothyroidism 03/08/2014   Degenerative arthritis of hip 03/07/2012    PCP: Maple Hudson MD   REFERRING PROVIDER: Dr Nancy Fetter  REFERRING DIAG: [phone] (ICD-10-CM) - Presence of left artificial hip joint   THERAPY DIAG:  Chronic bilateral low back pain without sciatica  Pain in right hip  Pain in left hip  Chronic right shoulder pain  Other abnormalities of gait and mobility  Rationale for Evaluation and Treatment: Rehabilitation  ONSET DATE:   SUBJECTIVE: 4/24 was the original revision surgery. long history of hip dislocations   SUBJECTIVE STATEMENT: The patient reports her hip has been sore. She is tying to do more. She has been working in the pool.     PERTINENT HISTORY: Right shoulder pain; right knee pain; Right Knee OA ; RA; connection tissue disease overlap syndrome. Charcot Foot   PAIN:  Are you having pain? Yes: NPRS scale: 3-4/10 Pain location: right knee Pain description: grinding  Aggravating factors: swimming; waking without support   Relieving factors: not doing things that     PRECAUTIONS: Other: hip : do not cross baseline   WEIGHT BEARING RESTRICTIONS: No  FALLS:  Has patient fallen in last 6 months? No  LIVING ENVIRONMENT: 2 steps in the house and 2 spots around the house in various areas  OCCUPATION:  Retired   PLOF: Independent with community mobility with device  PATIENT GOALS: to walk better   NEXT MD VISIT: 6 months   OBJECTIVE:   DIAGNOSTIC FINDINGS: Post op: Hip intact per patient   POSTURE: flexed posture left knee valgus   LOWER EXTREMITY ROM:  Active ROM Right eval Left eval  Hip flexion  WNL   Hip extension    Hip abduction    Hip adduction    Hip internal rotation    Hip external rotation    Knee flexion  Painful end range   Knee extension    Ankle dorsiflexion    Ankle plantarflexion    Ankle inversion    Ankle eversion     (Blank  rows = not tested)  LOWER EXTREMITY MMT:  MMT Right eval Left eval Right  left Rt/Lt (lb) 6/7  Hip flexion 13.9 10  11.6 16.6/25.8  Hip extension       Hip abduction 16.8 11.4  11 9.5 / 3-/5  Hip adduction       Hip internal rotation       Hip external rotation       Knee flexion       Knee extension 21.7 19  20.3 27.2/24.2  Ankle dorsiflexion       Ankle plantarflexion       Ankle inversion       Ankle eversion        (Blank rows = not tested)  ER/IR WFL Right 90 Shoulder flexion Left 120 shoulder flexion     FUNCTIONAL TESTS:  Will perform 5x sit tostand test next visit.  Sit to stand: uses hands   GAIT: Signifcant left trendelenburgh gait   TODAY'S TREATMENT:                                                                                                                               DATE:   6/14 LAQ 5 lbs left 2x20  LAQ 4lbs right 2x15    Sit to stand with band on the outside of the leg   Standing hip abduction 2x15  Standing hip extension 2x15   Side steppig at raised table 5x down and back     Seated hip abduction x20 left isolated yellow     Treatment                            6/7:  Sidelying clams- PT assist to lift Lt Hooklying clam- alt motion Gait- PT assisted to correct Lt trendelenburg Standing ball bw knees, upright/midline posture with walker, weight to heels Standing at bar- plank hip abd   12/29/22  Pt seen for aquatic therapy today.  Treatment took place in water 3.5-4.75 ft in depth at the [facility] pool. Temp of water was 91.  Pt entered/exited the pool via stairs and with hand rail.   Cycling on noodle *suspended sup on noodle abd curls 2x10 *Sitting balance on yellow noodle: ue lifted out of water  - pushing yellow noddle down with knees to chest for added core engagement *TrA sets: solid noodle pull downs in wide stance and alternating stance x10-12 reps ea *6 inch step ups bottom step 3.6 ft R/L *plank using yellow noodle/arm pull downs; suspended plank  Pt requires the buoyancy and hydrostatic pressure of water for support, and to offload joints by unweighting joint load by at least 50 % in navel deep water and by at least 75-80% in chest to neck deep water.  Viscosity of the water is needed for resistance of strengthening. Water current perturbations provides challenge to standing balance requiring increased core activation.  5/22 Ex bike 7 min   SAQ 2x20 3lbs  5/15 Ex bike: baseline 80 HR 124 SAQ 2x20 3lbs  LAQ 2x10  4 lbs  lbs   Narrow base of support EO and EC  Tandem stance on air-ex 2x30 sec hold   Gait analysis: reviewed gait in slow motion.     PATIENT EDUCATION:  Education details: HEP, symptom management  Person  educated: Patient Education method: Explanation, Demonstration, Tactile cues, Verbal cues, and Handouts Education comprehension: verbalized understanding, returned demonstration, verbal cues required, tactile cues required, and needs further education  HOME EXERCISE PROGRAM: Access Code: [email or web address] URL: [email or web address]   ASSESSMENT:  CLINICAL IMPRESSION: The patient reported that the ball between her knees was difficult and making her leg shoot in. We adjusted the exercises for a band on the outside of her leg. Therapy continues to progress the loading of her exercises. With standing exercises we cued her to slow it down and stand tall. She talked about raising her walker but the fear is she will either put stress on her shoulder or walk behind it and flex more. We added in side stepping today as well> She was fatigued at the end but otherwise did well. We updated her HEP.   OBJECTIVE IMPAIRMENTS: Abnormal gait, decreased activity tolerance, decreased balance, decreased endurance, decreased knowledge of use of DME, decreased mobility, difficulty walking, decreased ROM, decreased strength, increased fascial restrictions, increased muscle spasms, postural dysfunction, and pain.   ACTIVITY LIMITATIONS: carrying, lifting, bending, standing, squatting, stairs, transfers, and locomotion level  PARTICIPATION LIMITATIONS: meal prep, cleaning, laundry, driving, shopping, community activity, and yard work  PERSONAL FACTORS: 3+ comorbidities: RA ; multiple hip dislocations; right knee OA; bilateral RTC tears   are also affecting patient's functional outcome.   REHAB POTENTIAL: Good  CLINICAL DECISION MAKING: Evolving/moderate complexity  EVALUATION COMPLEXITY: Moderate   GOALS: Goals reviewed with patient? Yes   SHORT TERM GOALS: Target date: 12/09/2022   Patient will be independent with basic HEP for hip strengthening with no pain or instability  Baseline: Goal status:  Ongoing (Met with aquatics) 12/29/22  2.  Patient will increase gross bilateral hip strength by 5 pounds Baseline:  Goal status: partially met, unable to lift Rt Lt in abd against gravity  3.  Patient will ambulate 500 feet with upright standing posture and minimal pain with least restrictive assistive device Baseline:  Goal status: ongoing   LONG TERM GOALS: Target date: POC date    Patient will return to swimming with minimal pain Baseline:  Goal status:achieved  2.  Patient will ambulate community distances with least restrictive assistive device and good posture Baseline:  Goal status: INITIAL  3.  Patient will be independent with complete program for core stability, posture, and to maximize potential to increase gait distance Baseline:  Goal status: INITIAL  4.  Able to ambulate without valgus support through knees Baseline:  Goal status: INITIAL    PLAN:  PT FREQUENCY: 2x/week  PT DURATION: 8 weeks  PLANNED INTERVENTIONS: Therapeutic exercises, Therapeutic activity, Neuromuscular re-education, Balance training, Gait training, Patient/Family education, Self Care, Joint mobilization (of the shoulder), Stair training, Aquatic Therapy, Dry Needling, Cryotherapy, Moist heat, Taping, and Manual therapy  PLAN FOR NEXT SESSION: Aquatics: Work on gait stability, bilateral hip strengthening, core stability Land: Develop base HEP patient has pain lying supine in her back focus more on standing exercises if tolerated.  Get patient on exercise bike and/or NuStep for self guided gym program  Lorayne Bender PT DPT  01/14/23 2:11 PM

## 2023-01-19 ENCOUNTER — Ambulatory Visit (HOSPITAL_BASED_OUTPATIENT_CLINIC_OR_DEPARTMENT_OTHER): Payer: Medicare Other | Admitting: Physical Therapy

## 2023-01-19 DIAGNOSIS — M25511 Pain in right shoulder: Secondary | ICD-10-CM | POA: Diagnosis not present

## 2023-01-19 DIAGNOSIS — G8929 Other chronic pain: Secondary | ICD-10-CM

## 2023-01-19 DIAGNOSIS — R2689 Other abnormalities of gait and mobility: Secondary | ICD-10-CM

## 2023-01-19 DIAGNOSIS — M25552 Pain in left hip: Secondary | ICD-10-CM | POA: Diagnosis not present

## 2023-01-19 DIAGNOSIS — M25551 Pain in right hip: Secondary | ICD-10-CM | POA: Diagnosis not present

## 2023-01-19 DIAGNOSIS — M545 Low back pain, unspecified: Secondary | ICD-10-CM | POA: Diagnosis not present

## 2023-01-19 NOTE — Therapy (Signed)
OUTPATIENT PHYSICAL THERAPY LOWER EXTREMITY Progress Note     Patient Name: Virginia Gordon MRN: [id number] DOB:October 30, 1950, 72 y.o., female Today's Date: 01/14/2023  END OF SESSION:  PT End of Session - 01/14/23 1407     Visit Number 13    Number of Visits 29    Date for PT Re-Evaluation 04/01/23    Authorization Type MCR [employer]    PT Start Time 1300    PT Stop Time 1343    PT Time Calculation (min) 43 min    Activity Tolerance Patient tolerated treatment well    Behavior During Therapy WFL for tasks assessed/performed                 Past Medical History:  Diagnosis Date   Rheumatoid arthritis (HCC)    Past Surgical History:  Procedure Laterality Date   BREAST BIOPSY Left    pt not sure of date.   HIP SURGERY     SHOULDER SURGERY     Patient Active Problem List   Diagnosis Date Noted   Effusion of left knee joint 08/17/2021   ADD (attention deficit disorder) 11/13/2015   Connective tissue disease overlap syndrome (HCC) 11/13/2015   Menopausal and perimenopausal disorder 11/13/2015   Acne erythematosa 11/13/2015   Disordered sleep 11/13/2015   History of operative procedure on hip 07/03/2015   Left knee pain 05/20/2014   Right foot pain 05/14/2014   Bilateral shoulder pain 05/14/2014   Hypothyroidism 03/08/2014   Degenerative arthritis of hip 03/07/2012    PCP: Maple Hudson MD   REFERRING PROVIDER: Dr Nancy Fetter  REFERRING DIAG: [phone] (ICD-10-CM) - Presence of left artificial hip joint   THERAPY DIAG:  Chronic bilateral low back pain without sciatica  Pain in right hip  Pain in left hip  Chronic right shoulder pain  Other abnormalities of gait and mobility  Rationale for Evaluation and Treatment: Rehabilitation  ONSET DATE:   SUBJECTIVE: 4/24 was the original revision surgery. long history of hip dislocations   SUBJECTIVE STATEMENT: The patient reports her hip has been sore. She is tying to do more. She has been working in the pool.     PERTINENT HISTORY: Right shoulder pain; right knee pain; Right Knee OA ; RA; connection tissue disease overlap syndrome. Charcot Foot   PAIN:  Are you having pain? Yes: NPRS scale: 3-4/10 Pain location: right knee Pain description: grinding  Aggravating factors: swimming; waking without support   Relieving factors: not doing things that     PRECAUTIONS: Other: hip : do not cross baseline   WEIGHT BEARING RESTRICTIONS: No  FALLS:  Has patient fallen in last 6 months? No  LIVING ENVIRONMENT: 2 steps in the house and 2 spots around the house in various areas  OCCUPATION:  Retired   PLOF: Independent with community mobility with device  PATIENT GOALS: to walk better   NEXT MD VISIT: 6 months   OBJECTIVE:   DIAGNOSTIC FINDINGS: Post op: Hip intact per patient   POSTURE: flexed posture left knee valgus   LOWER EXTREMITY ROM:  Active ROM Right eval Left eval  Hip flexion  WNL   Hip extension    Hip abduction    Hip adduction    Hip internal rotation    Hip external rotation    Knee flexion  Painful end range   Knee extension    Ankle dorsiflexion    Ankle plantarflexion    Ankle inversion    Ankle eversion     (Blank  rows = not tested)  LOWER EXTREMITY MMT:  MMT Right eval Left eval Right  left Rt/Lt (lb) 6/7  Hip flexion 13.9 10  11.6 16.6/25.8  Hip extension       Hip abduction 16.8 11.4  11 9.5 / 3-/5  Hip adduction       Hip internal rotation       Hip external rotation       Knee flexion       Knee extension 21.7 19  20.3 27.2/24.2  Ankle dorsiflexion       Ankle plantarflexion       Ankle inversion       Ankle eversion        (Blank rows = not tested)  ER/IR WFL Right 90 Shoulder flexion Left 120 shoulder flexion     FUNCTIONAL TESTS:  Will perform 5x sit tostand test next visit.  Sit to stand: uses hands   GAIT: Signifcant left trendelenburgh gait   TODAY'S TREATMENT:                                                                                                                               DATE:   6/19 LAQ 5 lbs left 2x20  LAQ 5lbs right 2x15   Manual: trigger point release to lumbar spine; reviewed self soft tissue with thera-cane   Side stepping with cuing to keep strides small and posture straight 3 laps with seated rest break  Standing hip abduction 2x10   6/14 LAQ 5 lbs left 2x20  LAQ 4lbs right 2x15    Sit to stand with band on the outside of the leg   Standing hip abduction 2x15  Standing hip extension 2x15   Side steppig at raised table 5x down and back     Seated hip abduction x20 left isolated yellow     Treatment                            6/7:  Sidelying clams- PT assist to lift Lt Hooklying clam- alt motion Gait- PT assisted to correct Lt trendelenburg Standing ball bw knees, upright/midline posture with walker, weight to heels Standing at bar- plank hip abd   12/29/22  Pt seen for aquatic therapy today.  Treatment took place in water 3.5-4.75 ft in depth at the [facility] pool. Temp of water was 91.  Pt entered/exited the pool via stairs and with hand rail.   Cycling on noodle *suspended sup on noodle abd curls 2x10 *Sitting balance on yellow noodle: ue lifted out of water  - pushing yellow noddle down with knees to chest for added core engagement *TrA sets: solid noodle pull downs in wide stance and alternating stance x10-12 reps ea *6 inch step ups bottom step 3.6 ft R/L *plank using yellow noodle/arm pull downs; suspended plank  Pt requires the buoyancy and hydrostatic pressure of water for support, and  to offload joints by unweighting joint load by at least 50 % in navel deep water and by at least 75-80% in chest to neck deep water.  Viscosity of the water is needed for resistance of strengthening. Water current perturbations provides challenge to standing balance requiring increased core activation.   5/22 Ex bike 7 min   SAQ 2x20  3lbs  5/15 Ex bike: baseline 80 HR 124 SAQ 2x20 3lbs  LAQ 2x10  4 lbs  lbs   Narrow base of support EO and EC  Tandem stance on air-ex 2x30 sec hold   Gait analysis: reviewed gait in slow motion.     PATIENT EDUCATION:  Education details: HEP, symptom management  Person educated: Patient Education method: Explanation, Demonstration, Tactile cues, Verbal cues, and Handouts Education comprehension: verbalized understanding, returned demonstration, verbal cues required, tactile cues required, and needs further education  HOME EXERCISE PROGRAM: Access Code: [email or web address] URL: [email or web address]   ASSESSMENT:  CLINICAL IMPRESSION: The patient has been having significant low back pain. It is keeping her from being able to sleep at night. Therapy performed manual therapy on her back and reviewed how to do at home. We also reviewed side stepping. If she leans forward she can do the exercise s quickly. If she concentrates on slowing it down and standing straight it is much more difficult. Therapy will continue to progress weights as tolerated.    OBJECTIVE IMPAIRMENTS: Abnormal gait, decreased activity tolerance, decreased balance, decreased endurance, decreased knowledge of use of DME, decreased mobility, difficulty walking, decreased ROM, decreased strength, increased fascial restrictions, increased muscle spasms, postural dysfunction, and pain.   ACTIVITY LIMITATIONS: carrying, lifting, bending, standing, squatting, stairs, transfers, and locomotion level  PARTICIPATION LIMITATIONS: meal prep, cleaning, laundry, driving, shopping, community activity, and yard work  PERSONAL FACTORS: 3+ comorbidities: RA ; multiple hip dislocations; right knee OA; bilateral RTC tears   are also affecting patient's functional outcome.   REHAB POTENTIAL: Good  CLINICAL DECISION MAKING: Evolving/moderate complexity  EVALUATION COMPLEXITY: Moderate   GOALS: Goals reviewed with  patient? Yes   SHORT TERM GOALS: Target date: 12/09/2022   Patient will be independent with basic HEP for hip strengthening with no pain or instability  Baseline: Goal status: Ongoing (Met with aquatics) 12/29/22  2.  Patient will increase gross bilateral hip strength by 5 pounds Baseline:  Goal status: partially met, unable to lift Rt Lt in abd against gravity  3.  Patient will ambulate 500 feet with upright standing posture and minimal pain with least restrictive assistive device Baseline:  Goal status: ongoing   LONG TERM GOALS: Target date: POC date    Patient will return to swimming with minimal pain Baseline:  Goal status:achieved  2.  Patient will ambulate community distances with least restrictive assistive device and good posture Baseline:  Goal status: INITIAL  3.  Patient will be independent with complete program for core stability, posture, and to maximize potential to increase gait distance Baseline:  Goal status: INITIAL  4.  Able to ambulate without valgus support through knees Baseline:  Goal status: INITIAL    PLAN:  PT FREQUENCY: 2x/week  PT DURATION: 8 weeks  PLANNED INTERVENTIONS: Therapeutic exercises, Therapeutic activity, Neuromuscular re-education, Balance training, Gait training, Patient/Family education, Self Care, Joint mobilization (of the shoulder), Stair training, Aquatic Therapy, Dry Needling, Cryotherapy, Moist heat, Taping, and Manual therapy  PLAN FOR NEXT SESSION: Aquatics: Work on gait stability, bilateral hip strengthening, core stability Land: Develop base HEP patient has pain lying supine in  her back focus more on standing exercises if tolerated.  Get patient on exercise bike and/or NuStep for self guided gym program  Lorayne Bender PT DPT  01/14/23 2:11 PM

## 2023-01-27 DIAGNOSIS — M47816 Spondylosis without myelopathy or radiculopathy, lumbar region: Secondary | ICD-10-CM | POA: Diagnosis not present

## 2023-01-28 ENCOUNTER — Encounter (HOSPITAL_BASED_OUTPATIENT_CLINIC_OR_DEPARTMENT_OTHER): Payer: Self-pay | Admitting: Physical Therapy

## 2023-01-28 ENCOUNTER — Ambulatory Visit (HOSPITAL_BASED_OUTPATIENT_CLINIC_OR_DEPARTMENT_OTHER): Payer: Medicare Other | Admitting: Physical Therapy

## 2023-01-28 DIAGNOSIS — G8929 Other chronic pain: Secondary | ICD-10-CM | POA: Diagnosis not present

## 2023-01-28 DIAGNOSIS — M25511 Pain in right shoulder: Secondary | ICD-10-CM | POA: Diagnosis not present

## 2023-01-28 DIAGNOSIS — R2689 Other abnormalities of gait and mobility: Secondary | ICD-10-CM | POA: Diagnosis not present

## 2023-01-28 DIAGNOSIS — M25552 Pain in left hip: Secondary | ICD-10-CM | POA: Diagnosis not present

## 2023-01-28 DIAGNOSIS — M545 Low back pain, unspecified: Secondary | ICD-10-CM | POA: Diagnosis not present

## 2023-01-28 DIAGNOSIS — M25551 Pain in right hip: Secondary | ICD-10-CM

## 2023-01-28 NOTE — Therapy (Signed)
OUTPATIENT PHYSICAL THERAPY LOWER EXTREMITY Progress Note     Patient Name: Virginia Gordon MRN: [id number] DOB:04-Oct-1950, 72 y.o., female Today's Date: 01/14/2023  END OF SESSION:  PT End of Session - 01/14/23 1407     Visit Number 13    Number of Visits 29    Date for PT Re-Evaluation 04/01/23    Authorization Type MCR USAA    PT Start Time 1300    PT Stop Time 1343    PT Time Calculation (min) 43 min    Activity Tolerance Patient tolerated treatment well    Behavior During Therapy WFL for tasks assessed/performed                 Past Medical History:  Diagnosis Date   Rheumatoid arthritis (HCC)    Past Surgical History:  Procedure Laterality Date   BREAST BIOPSY Left    pt not sure of date.   HIP SURGERY     SHOULDER SURGERY     Patient Active Problem List   Diagnosis Date Noted   Effusion of left knee joint 08/17/2021   ADD (attention deficit disorder) 11/13/2015   Connective tissue disease overlap syndrome (HCC) 11/13/2015   Menopausal and perimenopausal disorder 11/13/2015   Acne erythematosa 11/13/2015   Disordered sleep 11/13/2015   History of operative procedure on hip 07/03/2015   Left knee pain 05/20/2014   Right foot pain 05/14/2014   Bilateral shoulder pain 05/14/2014   Hypothyroidism 03/08/2014   Degenerative arthritis of hip 03/07/2012    PCP: Maple Hudson MD   REFERRING PROVIDER: Dr Nancy Fetter  REFERRING DIAG: [phone] (ICD-10-CM) - Presence of left artificial hip joint   THERAPY DIAG:  Chronic bilateral low back pain without sciatica  Pain in right hip  Pain in left hip  Chronic right shoulder pain  Other abnormalities of gait and mobility  Rationale for Evaluation and Treatment: Rehabilitation  ONSET DATE:   SUBJECTIVE: 4/24 was the original revision surgery. long history of hip dislocations   SUBJECTIVE STATEMENT: The patient had testing for her ablation a few days ago. She became very sore lying on her stomach.     PERTINENT HISTORY: Right shoulder pain; right knee pain; Right Knee OA ; RA; connection tissue disease overlap syndrome. Charcot Foot   PAIN:  Are you having pain? Yes: NPRS scale: 3-4/10 Pain location: right knee Pain description: grinding  Aggravating factors: swimming; waking without support   Relieving factors: not doing things that     PRECAUTIONS: Other: hip : do not cross baseline   WEIGHT BEARING RESTRICTIONS: No  FALLS:  Has patient fallen in last 6 months? No  LIVING ENVIRONMENT: 2 steps in the house and 2 spots around the house in various areas  OCCUPATION:  Retired   PLOF: Independent with community mobility with device  PATIENT GOALS: to walk better   NEXT MD VISIT: 6 months   OBJECTIVE:   DIAGNOSTIC FINDINGS: Post op: Hip intact per patient   POSTURE: flexed posture left knee valgus   LOWER EXTREMITY ROM:  Active ROM Right eval Left eval  Hip flexion  WNL   Hip extension    Hip abduction    Hip adduction    Hip internal rotation    Hip external rotation    Knee flexion  Painful end range   Knee extension    Ankle dorsiflexion    Ankle plantarflexion    Ankle inversion    Ankle eversion     (Blank rows =  not tested)  LOWER EXTREMITY MMT:  MMT Right eval Left eval Right  left Rt/Lt (lb) 6/7  Hip flexion 13.9 10  11.6 16.6/25.8  Hip extension       Hip abduction 16.8 11.4  11 9.5 / 3-/5  Hip adduction       Hip internal rotation       Hip external rotation       Knee flexion       Knee extension 21.7 19  20.3 27.2/24.2  Ankle dorsiflexion       Ankle plantarflexion       Ankle inversion       Ankle eversion        (Blank rows = not tested)  ER/IR WFL Right 90 Shoulder flexion Left 120 shoulder flexion     FUNCTIONAL TESTS:  Will perform 5x sit tostand test next visit.  Sit to stand: uses hands   GAIT: Signifcant left trendelenburgh gait   TODAY'S TREATMENT:                                                                                                                               DATE:   6/28  Ex-bike: 5 min L5  PPT 2x15   Supine march 2x10 reviewed progression    6/19 LAQ 5 lbs left 2x20  LAQ 5lbs right 2x15   Manual: trigger point release to lumbar spine; reviewed self soft tissue with thera-cane   Side stepping with cuing to keep strides small and posture straight 3 laps with seated rest break  Standing hip abduction 2x10   6/14 LAQ 5 lbs left 2x20  LAQ 4lbs right 2x15    Sit to stand with band on the outside of the leg   Standing hip abduction 2x15  Standing hip extension 2x15   Side steppig at raised table 5x down and back     Seated hip abduction x20 left isolated yellow     Treatment                            6/7:  Sidelying clams- PT assist to lift Lt Hooklying clam- alt motion Gait- PT assisted to correct Lt trendelenburg Standing ball bw knees, upright/midline posture with walker, weight to heels Standing at bar- plank hip abd   12/29/22  Pt seen for aquatic therapy today.  Treatment took place in water 3.5-4.75 ft in depth at the [facility] pool. Temp of water was 91.  Pt entered/exited the pool via stairs and with hand rail.   Cycling on noodle *suspended sup on noodle abd curls 2x10 *Sitting balance on yellow noodle: ue lifted out of water  - pushing yellow noddle down with knees to chest for added core engagement *TrA sets: solid noodle pull downs in wide stance and alternating stance x10-12 reps ea *6 inch step ups bottom step 3.6 ft R/L *plank using yellow noodle/arm  pull downs; suspended plank  Pt requires the buoyancy and hydrostatic pressure of water for support, and to offload joints by unweighting joint load by at least 50 % in navel deep water and by at least 75-80% in chest to neck deep water.  Viscosity of the water is needed for resistance of strengthening. Water current perturbations provides challenge to standing balance  requiring increased core activation.   5/22 Ex bike 7 min   SAQ 2x20 3lbs  5/15 Ex bike: baseline 80 HR 124 SAQ 2x20 3lbs  LAQ 2x10  4 lbs  lbs   Narrow base of support EO and EC  Tandem stance on air-ex 2x30 sec hold   Gait analysis: reviewed gait in slow motion.     PATIENT EDUCATION:  Education details: HEP, symptom management  Person educated: Patient Education method: Explanation, Demonstration, Tactile cues, Verbal cues, and Handouts Education comprehension: verbalized understanding, returned demonstration, verbal cues required, tactile cues required, and needs further education  HOME EXERCISE PROGRAM: Access Code: [email or web address] URL: [email or web address]   ASSESSMENT:  CLINICAL IMPRESSION: The patient tolerated treatment well. We were able to add supine march and review progression. We worked on side stepping with technique. She was advised she can work with that in the pool. We also worked on weight shifting as well. She will be potentially having an ablation soon. We will continue to progress. She will continue to work at home.   OBJECTIVE IMPAIRMENTS: Abnormal gait, decreased activity tolerance, decreased balance, decreased endurance, decreased knowledge of use of DME, decreased mobility, difficulty walking, decreased ROM, decreased strength, increased fascial restrictions, increased muscle spasms, postural dysfunction, and pain.   ACTIVITY LIMITATIONS: carrying, lifting, bending, standing, squatting, stairs, transfers, and locomotion level  PARTICIPATION LIMITATIONS: meal prep, cleaning, laundry, driving, shopping, community activity, and yard work  PERSONAL FACTORS: 3+ comorbidities: RA ; multiple hip dislocations; right knee OA; bilateral RTC tears   are also affecting patient's functional outcome.   REHAB POTENTIAL: Good  CLINICAL DECISION MAKING: Evolving/moderate complexity  EVALUATION COMPLEXITY: Moderate   GOALS: Goals reviewed with  patient? Yes   SHORT TERM GOALS: Target date: 12/09/2022   Patient will be independent with basic HEP for hip strengthening with no pain or instability  Baseline: Goal status: Ongoing (Met with aquatics) 12/29/22  2.  Patient will increase gross bilateral hip strength by 5 pounds Baseline:  Goal status: partially met, unable to lift Rt Lt in abd against gravity  3.  Patient will ambulate 500 feet with upright standing posture and minimal pain with least restrictive assistive device Baseline:  Goal status: ongoing   LONG TERM GOALS: Target date: POC date    Patient will return to swimming with minimal pain Baseline:  Goal status:achieved  2.  Patient will ambulate community distances with least restrictive assistive device and good posture Baseline:  Goal status: INITIAL  3.  Patient will be independent with complete program for core stability, posture, and to maximize potential to increase gait distance Baseline:  Goal status: INITIAL  4.  Able to ambulate without valgus support through knees Baseline:  Goal status: INITIAL    PLAN:  PT FREQUENCY: 2x/week  PT DURATION: 8 weeks  PLANNED INTERVENTIONS: Therapeutic exercises, Therapeutic activity, Neuromuscular re-education, Balance training, Gait training, Patient/Family education, Self Care, Joint mobilization (of the shoulder), Stair training, Aquatic Therapy, Dry Needling, Cryotherapy, Moist heat, Taping, and Manual therapy  PLAN FOR NEXT SESSION: Aquatics: Work on gait stability, bilateral hip strengthening, core stability Land: Develop base HEP patient has  pain lying supine in her back focus more on standing exercises if tolerated.  Get patient on exercise bike and/or NuStep for self guided gym program  Lorayne Bender PT DPT  01/14/23 2:11 PM

## 2023-02-05 ENCOUNTER — Encounter (HOSPITAL_BASED_OUTPATIENT_CLINIC_OR_DEPARTMENT_OTHER): Payer: Self-pay | Admitting: Physical Therapy

## 2023-02-05 ENCOUNTER — Ambulatory Visit (HOSPITAL_BASED_OUTPATIENT_CLINIC_OR_DEPARTMENT_OTHER): Payer: Medicare Other | Attending: Orthopedic Surgery | Admitting: Physical Therapy

## 2023-02-05 DIAGNOSIS — M25551 Pain in right hip: Secondary | ICD-10-CM | POA: Diagnosis not present

## 2023-02-05 DIAGNOSIS — G8929 Other chronic pain: Secondary | ICD-10-CM | POA: Insufficient documentation

## 2023-02-05 DIAGNOSIS — R2689 Other abnormalities of gait and mobility: Secondary | ICD-10-CM | POA: Diagnosis not present

## 2023-02-05 DIAGNOSIS — M25511 Pain in right shoulder: Secondary | ICD-10-CM | POA: Diagnosis not present

## 2023-02-05 DIAGNOSIS — M545 Low back pain, unspecified: Secondary | ICD-10-CM | POA: Insufficient documentation

## 2023-02-05 DIAGNOSIS — M25552 Pain in left hip: Secondary | ICD-10-CM | POA: Diagnosis not present

## 2023-02-05 NOTE — Therapy (Signed)
OUTPATIENT PHYSICAL THERAPY LOWER EXTREMITY Progress Note     Patient Name: Virginia Gordon MRN: [id number] DOB:1950-11-08, 72 y.o., female Today's Date: 02/05/2023  END OF SESSION:  PT End of Session - 02/05/23 1130     Visit Number 16    Number of Visits 29    Date for PT Re-Evaluation 04/01/23    Authorization Type MCR [employer]    PT Start Time 1130    PT Stop Time 1213    PT Time Calculation (min) 43 min    Activity Tolerance Patient tolerated treatment well    Behavior During Therapy WFL for tasks assessed/performed                 Past Medical History:  Diagnosis Date   Rheumatoid arthritis (HCC)    Past Surgical History:  Procedure Laterality Date   BREAST BIOPSY Left    pt not sure of date.   HIP SURGERY     SHOULDER SURGERY     Patient Active Problem List   Diagnosis Date Noted   Effusion of left knee joint 08/17/2021   ADD (attention deficit disorder) 11/13/2015   Connective tissue disease overlap syndrome (HCC) 11/13/2015   Menopausal and perimenopausal disorder 11/13/2015   Acne erythematosa 11/13/2015   Disordered sleep 11/13/2015   History of operative procedure on hip 07/03/2015   Left knee pain 05/20/2014   Right foot pain 05/14/2014   Bilateral shoulder pain 05/14/2014   Hypothyroidism 03/08/2014   Degenerative arthritis of hip 03/07/2012    PCP: Maple Hudson MD   REFERRING PROVIDER: Dr Nancy Fetter  REFERRING DIAG: [phone] (ICD-10-CM) - Presence of left artificial hip joint   THERAPY DIAG:  Chronic bilateral low back pain without sciatica  Pain in right hip  Rationale for Evaluation and Treatment: Rehabilitation  ONSET DATE:   SUBJECTIVE: 4/24 was the original revision surgery. long history of hip dislocations   SUBJECTIVE STATEMENT: The patient had testing for her ablation a few days ago. She became very sore lying on her stomach.    PERTINENT HISTORY: Right shoulder pain; right knee pain; Right Knee OA ; RA; connection  tissue disease overlap syndrome. Charcot Foot   PAIN:  Are you having pain? Yes: NPRS scale: 3-4/10 Pain location: right knee Pain description: grinding  Aggravating factors: swimming; waking without support   Relieving factors: not doing things that     PRECAUTIONS: Other: hip : do not cross baseline   WEIGHT BEARING RESTRICTIONS: No  FALLS:  Has patient fallen in last 6 months? No  LIVING ENVIRONMENT: 2 steps in the house and 2 spots around the house in various areas  OCCUPATION:  Retired   PLOF: Independent with community mobility with device  PATIENT GOALS: to walk better   NEXT MD VISIT: 6 months   OBJECTIVE:   DIAGNOSTIC FINDINGS: Post op: Hip intact per patient   POSTURE: flexed posture left knee valgus   LOWER EXTREMITY ROM:  Active ROM Right eval Left eval  Hip flexion  WNL   Hip extension    Hip abduction    Hip adduction    Hip internal rotation    Hip external rotation    Knee flexion  Painful end range   Knee extension    Ankle dorsiflexion    Ankle plantarflexion    Ankle inversion    Ankle eversion     (Blank rows = not tested)  LOWER EXTREMITY MMT:  MMT Right eval Left eval Right  left Rt/Lt (lb)  6/7  Hip flexion 13.9 10  11.6 16.6/25.8  Hip extension       Hip abduction 16.8 11.4  11 9.5 / 3-/5  Hip adduction       Hip internal rotation       Hip external rotation       Knee flexion       Knee extension 21.7 19  20.3 27.2/24.2  Ankle dorsiflexion       Ankle plantarflexion       Ankle inversion       Ankle eversion        (Blank rows = not tested)  ER/IR WFL Right 90 Shoulder flexion Left 120 shoulder flexion     FUNCTIONAL TESTS:  Will perform 5x sit tostand test next visit.  Sit to stand: uses hands   GAIT: Signifcant left trendelenburgh gait   TODAY'S TREATMENT:                                                                                                                              DATE:   Treatment                             7/6:  Bike yellow band at knees Standing row with glut set- both arms pull and alternating Bridge with cues for Lt glut activation Seated round back with feet on floor   6/28  Ex-bike: 5 min L5  PPT 2x15   Supine march 2x10 reviewed progression    6/19 LAQ 5 lbs left 2x20  LAQ 5lbs right 2x15   Manual: trigger point release to lumbar spine; reviewed self soft tissue with thera-cane   Side stepping with cuing to keep strides small and posture straight 3 laps with seated rest break  Standing hip abduction 2x10    PATIENT EDUCATION:  Education details: HEP, symptom management  Person educated: Patient Education method: Explanation, Demonstration, Tactile cues, Verbal cues, and Handouts Education comprehension: verbalized understanding, returned demonstration, verbal cues required, tactile cues required, and needs further education  HOME EXERCISE PROGRAM: Access Code: [email or web address] URL: [email or web address]   ASSESSMENT:  CLINICAL IMPRESSION: Glut contraction to reduce transverse plane pelvic rotation in standing. Good awareness of core contraction.   OBJECTIVE IMPAIRMENTS: Abnormal gait, decreased activity tolerance, decreased balance, decreased endurance, decreased knowledge of use of DME, decreased mobility, difficulty walking, decreased ROM, decreased strength, increased fascial restrictions, increased muscle spasms, postural dysfunction, and pain.   ACTIVITY LIMITATIONS: carrying, lifting, bending, standing, squatting, stairs, transfers, and locomotion level  PARTICIPATION LIMITATIONS: meal prep, cleaning, laundry, driving, shopping, community activity, and yard work  PERSONAL FACTORS: 3+ comorbidities: RA ; multiple hip dislocations; right knee OA; bilateral RTC tears   are also affecting patient's functional outcome.   REHAB POTENTIAL: Good  CLINICAL DECISION MAKING: Evolving/moderate complexity  EVALUATION COMPLEXITY:  Moderate   GOALS: Goals reviewed with patient? Yes   SHORT TERM GOALS: Target  date: 12/09/2022   Patient will be independent with basic HEP for hip strengthening with no pain or instability  Baseline: Goal status: Ongoing (Met with aquatics) 12/29/22  2.  Patient will increase gross bilateral hip strength by 5 pounds Baseline:  Goal status: partially met, unable to lift Rt Lt in abd against gravity  3.  Patient will ambulate 500 feet with upright standing posture and minimal pain with least restrictive assistive device Baseline:  Goal status: ongoing   LONG TERM GOALS: Target date: POC date    Patient will return to swimming with minimal pain Baseline:  Goal status:achieved  2.  Patient will ambulate community distances with least restrictive assistive device and good posture Baseline:  Goal status: INITIAL  3.  Patient will be independent with complete program for core stability, posture, and to maximize potential to increase gait distance Baseline:  Goal status: INITIAL  4.  Able to ambulate without valgus support through knees Baseline:  Goal status: INITIAL    PLAN:  PT FREQUENCY: 2x/week  PT DURATION: 8 weeks  PLANNED INTERVENTIONS: Therapeutic exercises, Therapeutic activity, Neuromuscular re-education, Balance training, Gait training, Patient/Family education, Self Care, Joint mobilization (of the shoulder), Stair training, Aquatic Therapy, Dry Needling, Cryotherapy, Moist heat, Taping, and Manual therapy  PLAN FOR NEXT SESSION: Aquatics: Work on gait stability, bilateral hip strengthening, core stability Land: Develop base HEP patient has pain lying supine in her back focus more on standing exercises if tolerated.  Get patient on exercise bike and/or NuStep for self guided gym program  Dhyana Bastone C. Kalsey Lull PT, DPT 02/05/23 12:14 PM

## 2023-02-11 ENCOUNTER — Ambulatory Visit (HOSPITAL_BASED_OUTPATIENT_CLINIC_OR_DEPARTMENT_OTHER): Payer: Medicare Other | Admitting: Physical Therapy

## 2023-02-11 ENCOUNTER — Encounter (HOSPITAL_BASED_OUTPATIENT_CLINIC_OR_DEPARTMENT_OTHER): Payer: Self-pay | Admitting: Physical Therapy

## 2023-02-11 DIAGNOSIS — G8929 Other chronic pain: Secondary | ICD-10-CM | POA: Diagnosis not present

## 2023-02-11 DIAGNOSIS — M545 Low back pain, unspecified: Secondary | ICD-10-CM

## 2023-02-11 DIAGNOSIS — M25552 Pain in left hip: Secondary | ICD-10-CM

## 2023-02-11 DIAGNOSIS — R2689 Other abnormalities of gait and mobility: Secondary | ICD-10-CM | POA: Diagnosis not present

## 2023-02-11 DIAGNOSIS — M25551 Pain in right hip: Secondary | ICD-10-CM | POA: Diagnosis not present

## 2023-02-11 DIAGNOSIS — M25511 Pain in right shoulder: Secondary | ICD-10-CM | POA: Diagnosis not present

## 2023-02-11 NOTE — Therapy (Signed)
OUTPATIENT PHYSICAL THERAPY LOWER EXTREMITY Progress Note     Patient Name: Virginia Gordon MRN: [id number] DOB:11-19-1950, 72 y.o., female Today's Date: 02/11/2023  END OF SESSION:  PT End of Session - 02/11/23 1203     Visit Number 17    Number of Visits 29    Date for PT Re-Evaluation 04/01/23    PT Start Time 1145    PT Stop Time 1226    PT Time Calculation (min) 41 min    Activity Tolerance Patient tolerated treatment well    Behavior During Therapy WFL for tasks assessed/performed                 Past Medical History:  Diagnosis Date   Rheumatoid arthritis (HCC)    Past Surgical History:  Procedure Laterality Date   BREAST BIOPSY Left    pt not sure of date.   HIP SURGERY     SHOULDER SURGERY     Patient Active Problem List   Diagnosis Date Noted   Effusion of left knee joint 08/17/2021   ADD (attention deficit disorder) 11/13/2015   Connective tissue disease overlap syndrome (HCC) 11/13/2015   Menopausal and perimenopausal disorder 11/13/2015   Acne erythematosa 11/13/2015   Disordered sleep 11/13/2015   History of operative procedure on hip 07/03/2015   Left knee pain 05/20/2014   Right foot pain 05/14/2014   Bilateral shoulder pain 05/14/2014   Hypothyroidism 03/08/2014   Degenerative arthritis of hip 03/07/2012    PCP: Maple Hudson MD   REFERRING PROVIDER: Dr Nancy Fetter  REFERRING DIAG: [phone] (ICD-10-CM) - Presence of left artificial hip joint   THERAPY DIAG:  Chronic bilateral low back pain without sciatica  Pain in right hip  Pain in left hip  Chronic right shoulder pain  Other abnormalities of gait and mobility  Rationale for Evaluation and Treatment: Rehabilitation  ONSET DATE:   SUBJECTIVE: 4/24 was the original revision surgery. long history of hip dislocations   SUBJECTIVE STATEMENT: The patient swam yesterday and has some increased soreness following swelling.  She reports overall she is at her normal level of  soreness  PERTINENT HISTORY: Right shoulder pain; right knee pain; Right Knee OA ; RA; connection tissue disease overlap syndrome. Charcot Foot   PAIN:  Are you having pain? Yes: NPRS scale: 3-4/10 Pain location: right knee Pain description: grinding  Aggravating factors: swimming; waking without support   Relieving factors: not doing things that     PRECAUTIONS: Other: hip : do not cross baseline   WEIGHT BEARING RESTRICTIONS: No  FALLS:  Has patient fallen in last 6 months? No  LIVING ENVIRONMENT: 2 steps in the house and 2 spots around the house in various areas  OCCUPATION:  Retired   PLOF: Independent with community mobility with device  PATIENT GOALS: to walk better   NEXT MD VISIT: 6 months   OBJECTIVE:   DIAGNOSTIC FINDINGS: Post op: Hip intact per patient   POSTURE: flexed posture left knee valgus   LOWER EXTREMITY ROM:  Active ROM Right eval Left eval  Hip flexion  WNL   Hip extension    Hip abduction    Hip adduction    Hip internal rotation    Hip external rotation    Knee flexion  Painful end range   Knee extension    Ankle dorsiflexion    Ankle plantarflexion    Ankle inversion    Ankle eversion     (Blank rows = not tested)  LOWER EXTREMITY  MMT:  MMT Right eval Left eval Right  left Rt/Lt (lb) 6/7  Hip flexion 13.9 10  11.6 16.6/25.8  Hip extension       Hip abduction 16.8 11.4  11 9.5 / 3-/5  Hip adduction       Hip internal rotation       Hip external rotation       Knee flexion       Knee extension 21.7 19  20.3 27.2/24.2  Ankle dorsiflexion       Ankle plantarflexion       Ankle inversion       Ankle eversion        (Blank rows = not tested)  ER/IR WFL Right 90 Shoulder flexion Left 120 shoulder flexion     FUNCTIONAL TESTS:  Will perform 5x sit tostand test next visit.  Sit to stand: uses hands   GAIT: Signifcant left trendelenburgh gait   TODAY'S TREATMENT:                                                                                                                               DATE:    7/12 Recumbent bike 5 minutes level 5  Supine march 2 x 15-second set with yellow band Supine hip abduction 2 x 15 yellow band left hip abduction noted Posterior pelvic tilt 2 x 15  Sidestepping at the bar with emphasis on slowing down movement and single leg stance on the left.  6 steps left and right x 10laps  Weight shift with emphasis on glutes squeeze 2 x 15 Standing left hip march 2 x 15     Treatment                            7/6:  Bike yellow band at knees Standing row with glut set- both arms pull and alternating Bridge with cues for Lt glut activation Seated round back with feet on floor   6/28  Ex-bike: 5 min L5  PPT 2x15   Supine march 2x10 reviewed progression    6/19 LAQ 5 lbs left 2x20  LAQ 5lbs right 2x15   Manual: trigger point release to lumbar spine; reviewed self soft tissue with thera-cane   Side stepping with cuing to keep strides small and posture straight 3 laps with seated rest break  Standing hip abduction 2x10    PATIENT EDUCATION:  Education details: HEP, symptom management  Person educated: Patient Education method: Explanation, Demonstration, Tactile cues, Verbal cues, and Handouts Education comprehension: verbalized understanding, returned demonstration, verbal cues required, tactile cues required, and needs further education  HOME EXERCISE PROGRAM: Access Code: [email or web address] URL: [email or web address]   ASSESSMENT:  CLINICAL IMPRESSION: Therapy emphasized keeping her exercises slowly talking in bottom when performing exercises.  Therapy updated HEP.  Continue with her exercises.  We added some resistance to her supine march and hip abduction.  She is advised to  continue trying to do this at home.  She is getting awareness when her left leg is going in.  OBJECTIVE IMPAIRMENTS: Abnormal gait, decreased activity tolerance, decreased  balance, decreased endurance, decreased knowledge of use of DME, decreased mobility, difficulty walking, decreased ROM, decreased strength, increased fascial restrictions, increased muscle spasms, postural dysfunction, and pain.   ACTIVITY LIMITATIONS: carrying, lifting, bending, standing, squatting, stairs, transfers, and locomotion level  PARTICIPATION LIMITATIONS: meal prep, cleaning, laundry, driving, shopping, community activity, and yard work  PERSONAL FACTORS: 3+ comorbidities: RA ; multiple hip dislocations; right knee OA; bilateral RTC tears   are also affecting patient's functional outcome.   REHAB POTENTIAL: Good  CLINICAL DECISION MAKING: Evolving/moderate complexity  EVALUATION COMPLEXITY: Moderate   GOALS: Goals reviewed with patient? Yes   SHORT TERM GOALS: Target date: 12/09/2022   Patient will be independent with basic HEP for hip strengthening with no pain or instability  Baseline: Goal status: Ongoing (Met with aquatics) 12/29/22  2.  Patient will increase gross bilateral hip strength by 5 pounds Baseline:  Goal status: partially met, unable to lift Rt Lt in abd against gravity  3.  Patient will ambulate 500 feet with upright standing posture and minimal pain with least restrictive assistive device Baseline:  Goal status: ongoing   LONG TERM GOALS: Target date: POC date    Patient will return to swimming with minimal pain Baseline:  Goal status:achieved  2.  Patient will ambulate community distances with least restrictive assistive device and good posture Baseline:  Goal status: INITIAL  3.  Patient will be independent with complete program for core stability, posture, and to maximize potential to increase gait distance Baseline:  Goal status: INITIAL  4.  Able to ambulate without valgus support through knees Baseline:  Goal status: INITIAL    PLAN:  PT FREQUENCY: 2x/week  PT DURATION: 8 weeks  PLANNED INTERVENTIONS: Therapeutic exercises,  Therapeutic activity, Neuromuscular re-education, Balance training, Gait training, Patient/Family education, Self Care, Joint mobilization (of the shoulder), Stair training, Aquatic Therapy, Dry Needling, Cryotherapy, Moist heat, Taping, and Manual therapy  PLAN FOR NEXT SESSION: Aquatics: Work on gait stability, bilateral hip strengthening, core stability Land: Develop base HEP patient has pain lying supine in her back focus more on standing exercises if tolerated.  Get patient on exercise bike and/or NuStep for self guided gym program  Jessica C. Hightower PT, DPT 02/11/23 1:05 PM

## 2023-02-18 ENCOUNTER — Encounter (HOSPITAL_BASED_OUTPATIENT_CLINIC_OR_DEPARTMENT_OTHER): Payer: Self-pay

## 2023-02-18 ENCOUNTER — Ambulatory Visit (HOSPITAL_BASED_OUTPATIENT_CLINIC_OR_DEPARTMENT_OTHER): Payer: Medicare Other | Admitting: Physical Therapy

## 2023-02-21 ENCOUNTER — Encounter (HOSPITAL_BASED_OUTPATIENT_CLINIC_OR_DEPARTMENT_OTHER): Payer: Self-pay | Admitting: Physical Therapy

## 2023-02-25 ENCOUNTER — Ambulatory Visit (HOSPITAL_BASED_OUTPATIENT_CLINIC_OR_DEPARTMENT_OTHER): Payer: Medicare Other | Admitting: Physical Therapy

## 2023-02-25 DIAGNOSIS — M545 Low back pain, unspecified: Secondary | ICD-10-CM

## 2023-02-25 DIAGNOSIS — R2689 Other abnormalities of gait and mobility: Secondary | ICD-10-CM | POA: Diagnosis not present

## 2023-02-25 DIAGNOSIS — M25552 Pain in left hip: Secondary | ICD-10-CM | POA: Diagnosis not present

## 2023-02-25 DIAGNOSIS — M25511 Pain in right shoulder: Secondary | ICD-10-CM | POA: Diagnosis not present

## 2023-02-25 DIAGNOSIS — G8929 Other chronic pain: Secondary | ICD-10-CM | POA: Diagnosis not present

## 2023-02-25 DIAGNOSIS — M25551 Pain in right hip: Secondary | ICD-10-CM | POA: Diagnosis not present

## 2023-02-26 ENCOUNTER — Encounter (HOSPITAL_BASED_OUTPATIENT_CLINIC_OR_DEPARTMENT_OTHER): Payer: Self-pay | Admitting: Physical Therapy

## 2023-02-26 NOTE — Therapy (Signed)
OUTPATIENT PHYSICAL THERAPY LOWER EXTREMITY Progress Note     Patient Name: Virginia Gordon MRN: [id number] DOB:01-11-1951, 72 y.o., female Today's Date: 02/26/2023  END OF SESSION:  PT End of Session - 02/25/23 1204     Visit Number 18    Number of Visits 29    Date for PT Re-Evaluation 04/01/23    Authorization Type MCR [employer]    PT Start Time 1147    PT Stop Time 1230    PT Time Calculation (min) 43 min    Activity Tolerance Patient tolerated treatment well    Behavior During Therapy WFL for tasks assessed/performed                 Past Medical History:  Diagnosis Date   Rheumatoid arthritis (HCC)    Past Surgical History:  Procedure Laterality Date   BREAST BIOPSY Left    pt not sure of date.   HIP SURGERY     SHOULDER SURGERY     Patient Active Problem List   Diagnosis Date Noted   Effusion of left knee joint 08/17/2021   ADD (attention deficit disorder) 11/13/2015   Connective tissue disease overlap syndrome (HCC) 11/13/2015   Menopausal and perimenopausal disorder 11/13/2015   Acne erythematosa 11/13/2015   Disordered sleep 11/13/2015   History of operative procedure on hip 07/03/2015   Left knee pain 05/20/2014   Right foot pain 05/14/2014   Bilateral shoulder pain 05/14/2014   Hypothyroidism 03/08/2014   Degenerative arthritis of hip 03/07/2012    PCP: Maple Hudson MD   REFERRING PROVIDER: Dr Nancy Fetter  REFERRING DIAG: [phone] (ICD-10-CM) - Presence of left artificial hip joint   THERAPY DIAG:  Chronic bilateral low back pain without sciatica  Pain in right hip  Pain in left hip  Chronic right shoulder pain  Other abnormalities of gait and mobility  Rationale for Evaluation and Treatment: Rehabilitation  ONSET DATE:   SUBJECTIVE: 4/24 was the original revision surgery. long history of hip dislocations   SUBJECTIVE STATEMENT: The patient was standing and twisted last week and felt a dislocation. She contacted the MD who told  her unless it happens again to keep her next appointment and to keep doing what she is doing. She reports no more pain then she felt before.   PERTINENT HISTORY: Right shoulder pain; right knee pain; Right Knee OA ; RA; connection tissue disease overlap syndrome. Charcot Foot   PAIN:  Are you having pain? Yes: NPRS scale: 3-4/10 Pain location: right knee Pain description: grinding  Aggravating factors: swimming; waking without support   Relieving factors: not doing things that     PRECAUTIONS: Other: hip : do not cross baseline   WEIGHT BEARING RESTRICTIONS: No  FALLS:  Has patient fallen in last 6 months? No  LIVING ENVIRONMENT: 2 steps in the house and 2 spots around the house in various areas  OCCUPATION:  Retired   PLOF: Independent with community mobility with device  PATIENT GOALS: to walk better   NEXT MD VISIT: 6 months   OBJECTIVE:   DIAGNOSTIC FINDINGS: Post op: Hip intact per patient   POSTURE: flexed posture left knee valgus   LOWER EXTREMITY ROM:  Active ROM Right eval Left eval  Hip flexion  WNL   Hip extension    Hip abduction    Hip adduction    Hip internal rotation    Hip external rotation    Knee flexion  Painful end range   Knee extension  Ankle dorsiflexion    Ankle plantarflexion    Ankle inversion    Ankle eversion     (Blank rows = not tested)  LOWER EXTREMITY MMT:  MMT Right eval Left eval Right  left Rt/Lt (lb) 6/7  Hip flexion 13.9 10  11.6 16.6/25.8  Hip extension       Hip abduction 16.8 11.4  11 9.5 / 3-/5  Hip adduction       Hip internal rotation       Hip external rotation       Knee flexion       Knee extension 21.7 19  20.3 27.2/24.2  Ankle dorsiflexion       Ankle plantarflexion       Ankle inversion       Ankle eversion        (Blank rows = not tested)  ER/IR WFL Right 90 Shoulder flexion Left 120 shoulder flexion     FUNCTIONAL TESTS:  Will perform 5x sit tostand test next visit.  Sit to  stand: uses hands   GAIT: Signifcant left trendelenburgh gait   TODAY'S TREATMENT:                                                                                                                              DATE:   7/27 Recumbent bike 5 minutes level 1 Patient told therapist about dislocation while on recumbent bike. She reported itfelt good. Since she was not having a problem therapy let patient continue.   Quad set 2x20 bilateral SAQ 2 lbs 2x15 bilateral   LAQ 2x15 2lbs   Standing weight shift 2x15  Standing left leg march 2x15      7/12 Recumbent bike 5 minutes level 5  Supine march 2 x 15-second set with yellow band Supine hip abduction 2 x 15 yellow band left hip abduction noted Posterior pelvic tilt 2 x 15  Sidestepping at the bar with emphasis on slowing down movement and single leg stance on the left.  6 steps left and right x 10laps  Weight shift with emphasis on glutes squeeze 2 x 15 Standing left hip march 2 x 15     Treatment                            7/6:  Bike yellow band at knees Standing row with glut set- both arms pull and alternating Bridge with cues for Lt glut activation Seated round back with feet on floor   6/28  Ex-bike: 5 min L5  PPT 2x15   Supine march 2x10 reviewed progression    6/19 LAQ 5 lbs left 2x20  LAQ 5lbs right 2x15   Manual: trigger point release to lumbar spine; reviewed self soft tissue with thera-cane   Side stepping with cuing to keep strides small and posture straight 3 laps with seated rest break  Standing hip abduction 2x10  PATIENT EDUCATION:  Education details: HEP, symptom management  Person educated: Patient Education method: Explanation, Demonstration, Tactile cues, Verbal cues, and Handouts Education comprehension: verbalized understanding, returned demonstration, verbal cues required, tactile cues required, and needs further education  HOME EXERCISE PROGRAM: Access Code: [email or web address] URL:  [email or web address]   ASSESSMENT:  CLINICAL IMPRESSION: Therapy kept the patients exercises light today. She had no ss of dislocation. She has had more frequent falls. The MD did not say to stp therapy. Stopping with her falling more would not likley be beneficial. We will progress her back into balance exercises as tolerated. Her last fall was on a stool. She was advised to be careful with things that could roll and be careful with things like stairs.    OBJECTIVE IMPAIRMENTS: Abnormal gait, decreased activity tolerance, decreased balance, decreased endurance, decreased knowledge of use of DME, decreased mobility, difficulty walking, decreased ROM, decreased strength, increased fascial restrictions, increased muscle spasms, postural dysfunction, and pain.   ACTIVITY LIMITATIONS: carrying, lifting, bending, standing, squatting, stairs, transfers, and locomotion level  PARTICIPATION LIMITATIONS: meal prep, cleaning, laundry, driving, shopping, community activity, and yard work  PERSONAL FACTORS: 3+ comorbidities: RA ; multiple hip dislocations; right knee OA; bilateral RTC tears   are also affecting patient's functional outcome.   REHAB POTENTIAL: Good  CLINICAL DECISION MAKING: Evolving/moderate complexity  EVALUATION COMPLEXITY: Moderate   GOALS: Goals reviewed with patient? Yes   SHORT TERM GOALS: Target date: 12/09/2022   Patient will be independent with basic HEP for hip strengthening with no pain or instability  Baseline: Goal status: Ongoing (Met with aquatics) 12/29/22  2.  Patient will increase gross bilateral hip strength by 5 pounds Baseline:  Goal status: partially met, unable to lift Rt Lt in abd against gravity  3.  Patient will ambulate 500 feet with upright standing posture and minimal pain with least restrictive assistive device Baseline:  Goal status: ongoing   LONG TERM GOALS: Target date: POC date    Patient will return to swimming with  minimal pain Baseline:  Goal status:achieved  2.  Patient will ambulate community distances with least restrictive assistive device and good posture Baseline:  Goal status: INITIAL  3.  Patient will be independent with complete program for core stability, posture, and to maximize potential to increase gait distance Baseline:  Goal status: INITIAL  4.  Able to ambulate without valgus support through knees Baseline:  Goal status: INITIAL    PLAN:  PT FREQUENCY: 2x/week  PT DURATION: 8 weeks  PLANNED INTERVENTIONS: Therapeutic exercises, Therapeutic activity, Neuromuscular re-education, Balance training, Gait training, Patient/Family education, Self Care, Joint mobilization (of the shoulder), Stair training, Aquatic Therapy, Dry Needling, Cryotherapy, Moist heat, Taping, and Manual therapy  PLAN FOR NEXT SESSION: Aquatics: Work on gait stability, bilateral hip strengthening, core stability Land: Develop base HEP patient has pain lying supine in her back focus more on standing exercises if tolerated.  Get patient on exercise bike and/or NuStep for self guided gym program  Lorayne Bender PT DPT 02/26/23 8:47 AM

## 2023-02-28 MED ORDER — METHYLPREDNISOLONE ACETATE 40 MG/ML IJ SUSP: 40.0000 mg | Freq: Once | INTRAMUSCULAR | Status: DC

## 2023-02-28 NOTE — Addendum Note (Signed)
Addended by: Annita Brod on: 02/28/2023 12:05 PM   Modules accepted: Orders

## 2023-03-01 DIAGNOSIS — M47816 Spondylosis without myelopathy or radiculopathy, lumbar region: Secondary | ICD-10-CM | POA: Diagnosis not present

## 2023-03-04 ENCOUNTER — Encounter (HOSPITAL_BASED_OUTPATIENT_CLINIC_OR_DEPARTMENT_OTHER): Payer: Self-pay | Admitting: Physical Therapy

## 2023-03-04 ENCOUNTER — Ambulatory Visit (HOSPITAL_BASED_OUTPATIENT_CLINIC_OR_DEPARTMENT_OTHER): Payer: Medicare Other | Attending: Orthopedic Surgery | Admitting: Physical Therapy

## 2023-03-04 DIAGNOSIS — M25551 Pain in right hip: Secondary | ICD-10-CM | POA: Diagnosis not present

## 2023-03-04 DIAGNOSIS — M25552 Pain in left hip: Secondary | ICD-10-CM | POA: Diagnosis not present

## 2023-03-04 DIAGNOSIS — R2689 Other abnormalities of gait and mobility: Secondary | ICD-10-CM | POA: Insufficient documentation

## 2023-03-04 DIAGNOSIS — G8929 Other chronic pain: Secondary | ICD-10-CM | POA: Diagnosis not present

## 2023-03-04 DIAGNOSIS — M25511 Pain in right shoulder: Secondary | ICD-10-CM | POA: Insufficient documentation

## 2023-03-04 DIAGNOSIS — M545 Low back pain, unspecified: Secondary | ICD-10-CM | POA: Diagnosis not present

## 2023-03-04 NOTE — Therapy (Signed)
OUTPATIENT PHYSICAL THERAPY LOWER EXTREMITY Progress Note     Patient Name: Virginia Gordon MRN: [id number] DOB:23-Feb-1951, 72 y.o., female Today's Date: 03/04/2023  END OF SESSION:  PT End of Session - 03/04/23 1156     Visit Number 19    Number of Visits 29    Date for PT Re-Evaluation 04/01/23    Authorization Type MCR [employer]    PT Start Time 1145    PT Stop Time 1227    PT Time Calculation (min) 42 min    Activity Tolerance Patient tolerated treatment well    Behavior During Therapy WFL for tasks assessed/performed                 Past Medical History:  Diagnosis Date   Rheumatoid arthritis (HCC)    Past Surgical History:  Procedure Laterality Date   BREAST BIOPSY Left    pt not sure of date.   HIP SURGERY     SHOULDER SURGERY     Patient Active Problem List   Diagnosis Date Noted   Effusion of left knee joint 08/17/2021   ADD (attention deficit disorder) 11/13/2015   Connective tissue disease overlap syndrome (HCC) 11/13/2015   Menopausal and perimenopausal disorder 11/13/2015   Acne erythematosa 11/13/2015   Disordered sleep 11/13/2015   History of operative procedure on hip 07/03/2015   Left knee pain 05/20/2014   Right foot pain 05/14/2014   Bilateral shoulder pain 05/14/2014   Hypothyroidism 03/08/2014   Degenerative arthritis of hip 03/07/2012    PCP: Maple Hudson MD   REFERRING PROVIDER: Dr Nancy Fetter  REFERRING DIAG: [phone] (ICD-10-CM) - Presence of left artificial hip joint   THERAPY DIAG:  Chronic bilateral low back pain without sciatica  Pain in right hip  Pain in left hip  Other abnormalities of gait and mobility  Rationale for Evaluation and Treatment: Rehabilitation  ONSET DATE:   SUBJECTIVE: 4/24 was the original revision surgery. long history of hip dislocations   SUBJECTIVE STATEMENT: The patient has no further signs of dislocation over the past week.  She has returned to the pool.  She has been swimming.  She has  some pain in her left shoulder blade.   PERTINENT HISTORY: Right shoulder pain; right knee pain; Right Knee OA ; RA; connection tissue disease overlap syndrome. Charcot Foot   PAIN:  Are you having pain? Yes: NPRS scale: 3-4/10 Pain location: Left shoulder blade Pain description: grinding  Aggravating factors: swimming; waking without support   Relieving factors: not doing things that     PRECAUTIONS: Other: hip : do not cross baseline   WEIGHT BEARING RESTRICTIONS: No  FALLS:  Has patient fallen in last 6 months? No  LIVING ENVIRONMENT: 2 steps in the house and 2 spots around the house in various areas  OCCUPATION:  Retired   PLOF: Independent with community mobility with device  PATIENT GOALS: to walk better   NEXT MD VISIT: 6 months   OBJECTIVE:   DIAGNOSTIC FINDINGS: Post op: Hip intact per patient   POSTURE: flexed posture left knee valgus   LOWER EXTREMITY ROM:  Active ROM Right eval Left eval  Hip flexion  WNL   Hip extension    Hip abduction    Hip adduction    Hip internal rotation    Hip external rotation    Knee flexion  Painful end range   Knee extension    Ankle dorsiflexion    Ankle plantarflexion    Ankle inversion  Ankle eversion     (Blank rows = not tested)  LOWER EXTREMITY MMT:  MMT Right eval Left eval Right  left Rt/Lt (lb) 6/7  Hip flexion 13.9 10  11.6 16.6/25.8  Hip extension       Hip abduction 16.8 11.4  11 9.5 / 3-/5  Hip adduction       Hip internal rotation       Hip external rotation       Knee flexion       Knee extension 21.7 19  20.3 27.2/24.2  Ankle dorsiflexion       Ankle plantarflexion       Ankle inversion       Ankle eversion        (Blank rows = not tested)  ER/IR WFL Right 90 Shoulder flexion Left 120 shoulder flexion     FUNCTIONAL TESTS:  Will perform 5x sit tostand test next visit.  Sit to stand: uses hands   GAIT: Signifcant left trendelenburgh gait   TODAY'S TREATMENT:                                                                                                                               DATE:   8/2 Recumbent bike 5 minutes level 1  SAQ 2 x 20 each leg 3 pounds Patient requested to try straight leg raise.  Surprisingly she had less difficulty on the left leg than the right leg.  3 x 10 3 pounds bilateral  Long arc quad 2 x 25 pounds  Sidestepping at bar 3 laps 2 times with cueing to take short steps and stand straight, engage glutes.  Forward and backward stepping right upper extremity support.  2 X 15  7/27 Recumbent bike 5 minutes level 1 Patient told therapist about dislocation while on recumbent bike. She reported itfelt good. Since she was not having a problem therapy let patient continue.   Quad set 2x20 bilateral SAQ 2 lbs 2x15 bilateral   LAQ 2x15 2lbs   Standing weight shift 2x15  Standing left leg march 2x15      7/12 Recumbent bike 5 minutes level 5  Supine march 2 x 15-second set with yellow band Supine hip abduction 2 x 15 yellow band left hip abduction noted Posterior pelvic tilt 2 x 15  Sidestepping at the bar with emphasis on slowing down movement and single leg stance on the left.  6 steps left and right x 10laps  Weight shift with emphasis on glutes squeeze 2 x 15 Standing left hip march 2 x 15     Treatment                            7/6:  Bike yellow band at knees Standing row with glut set- both arms pull and alternating Bridge with cues for Lt glut activation Seated round back with feet on floor   6/28  Ex-bike: 5 min L5  PPT 2x15   Supine march 2x10 reviewed progression    6/19 LAQ 5 lbs left 2x20  LAQ 5lbs right 2x15   Manual: trigger point release to lumbar spine; reviewed self soft tissue with thera-cane   Side stepping with cuing to keep strides small and posture straight 3 laps with seated rest break  Standing hip abduction 2x10    PATIENT EDUCATION:  Education details: HEP, symptom  management  Person educated: Patient Education method: Explanation, Demonstration, Tactile cues, Verbal cues, and Handouts Education comprehension: verbalized understanding, returned demonstration, verbal cues required, tactile cues required, and needs further education  HOME EXERCISE PROGRAM: Access Code: [email or web address] URL: [email or web address]   ASSESSMENT:  CLINICAL IMPRESSION: We were able to resume patient's exercises she had no signs of dislocation or no discomfort in her hip.  She continues to require some cueing to stay straight.  As expected she has increased difficulty weightbearing on the left.  Surprisingly she had less difficulty with straight leg raise on the left.  Therapy will continue to progress her exercises as she tolerates.  Will perform reassessment next visit.   OBJECTIVE IMPAIRMENTS: Abnormal gait, decreased activity tolerance, decreased balance, decreased endurance, decreased knowledge of use of DME, decreased mobility, difficulty walking, decreased ROM, decreased strength, increased fascial restrictions, increased muscle spasms, postural dysfunction, and pain.   ACTIVITY LIMITATIONS: carrying, lifting, bending, standing, squatting, stairs, transfers, and locomotion level  PARTICIPATION LIMITATIONS: meal prep, cleaning, laundry, driving, shopping, community activity, and yard work  PERSONAL FACTORS: 3+ comorbidities: RA ; multiple hip dislocations; right knee OA; bilateral RTC tears   are also affecting patient's functional outcome.   REHAB POTENTIAL: Good  CLINICAL DECISION MAKING: Evolving/moderate complexity  EVALUATION COMPLEXITY: Moderate   GOALS: Goals reviewed with patient? Yes   SHORT TERM GOALS: Target date: 12/09/2022   Patient will be independent with basic HEP for hip strengthening with no pain or instability  Baseline: Goal status: Ongoing (Met with aquatics) 12/29/22  2.  Patient will increase gross bilateral hip strength by 5  pounds Baseline:  Goal status: partially met, unable to lift Rt Lt in abd against gravity  3.  Patient will ambulate 500 feet with upright standing posture and minimal pain with least restrictive assistive device Baseline:  Goal status: ongoing   LONG TERM GOALS: Target date: POC date    Patient will return to swimming with minimal pain Baseline:  Goal status:achieved  2.  Patient will ambulate community distances with least restrictive assistive device and good posture Baseline:  Goal status: INITIAL  3.  Patient will be independent with complete program for core stability, posture, and to maximize potential to increase gait distance Baseline:  Goal status: INITIAL  4.  Able to ambulate without valgus support through knees Baseline:  Goal status: INITIAL    PLAN:  PT FREQUENCY: 2x/week  PT DURATION: 8 weeks  PLANNED INTERVENTIONS: Therapeutic exercises, Therapeutic activity, Neuromuscular re-education, Balance training, Gait training, Patient/Family education, Self Care, Joint mobilization (of the shoulder), Stair training, Aquatic Therapy, Dry Needling, Cryotherapy, Moist heat, Taping, and Manual therapy  PLAN FOR NEXT SESSION: Aquatics: Work on gait stability, bilateral hip strengthening, core stability Land: Develop base HEP patient has pain lying supine in her back focus more on standing exercises if tolerated.  Get patient on exercise bike and/or NuStep for self guided gym program  Lorayne Bender PT DPT 03/04/23 1:14 PM

## 2023-03-11 ENCOUNTER — Ambulatory Visit (HOSPITAL_BASED_OUTPATIENT_CLINIC_OR_DEPARTMENT_OTHER): Payer: Medicare Other | Admitting: Physical Therapy

## 2023-03-11 ENCOUNTER — Encounter (HOSPITAL_BASED_OUTPATIENT_CLINIC_OR_DEPARTMENT_OTHER): Payer: Self-pay | Admitting: Physical Therapy

## 2023-03-11 DIAGNOSIS — R2689 Other abnormalities of gait and mobility: Secondary | ICD-10-CM | POA: Diagnosis not present

## 2023-03-11 DIAGNOSIS — G8929 Other chronic pain: Secondary | ICD-10-CM | POA: Diagnosis not present

## 2023-03-11 DIAGNOSIS — M25552 Pain in left hip: Secondary | ICD-10-CM | POA: Diagnosis not present

## 2023-03-11 DIAGNOSIS — M545 Low back pain, unspecified: Secondary | ICD-10-CM

## 2023-03-11 DIAGNOSIS — M25551 Pain in right hip: Secondary | ICD-10-CM

## 2023-03-11 DIAGNOSIS — M25511 Pain in right shoulder: Secondary | ICD-10-CM | POA: Diagnosis not present

## 2023-03-11 NOTE — Therapy (Unsigned)
OUTPATIENT PHYSICAL THERAPY LOWER EXTREMITY Progress Note     Patient Name: Virginia Gordon MRN: [id number] DOB:November 01, 1950, 72 y.o., female Today's Date: 03/11/2023  END OF SESSION:  PT End of Session - 03/11/23 1200     Visit Number 20    Number of Visits 36    Date for PT Re-Evaluation 05/06/23    Authorization Type MCR USAA    PT Start Time 1147    PT Stop Time 1230    PT Time Calculation (min) 43 min    Activity Tolerance Patient tolerated treatment well    Behavior During Therapy WFL for tasks assessed/performed                 Past Medical History:  Diagnosis Date   Rheumatoid arthritis (HCC)    Past Surgical History:  Procedure Laterality Date   BREAST BIOPSY Left    pt not sure of date.   HIP SURGERY     SHOULDER SURGERY     Patient Active Problem List   Diagnosis Date Noted   Effusion of left knee joint 08/17/2021   ADD (attention deficit disorder) 11/13/2015   Connective tissue disease overlap syndrome (HCC) 11/13/2015   Menopausal and perimenopausal disorder 11/13/2015   Acne erythematosa 11/13/2015   Disordered sleep 11/13/2015   History of operative procedure on hip 07/03/2015   Left knee pain 05/20/2014   Right foot pain 05/14/2014   Bilateral shoulder pain 05/14/2014   Hypothyroidism 03/08/2014   Degenerative arthritis of hip 03/07/2012    PCP: Maple Hudson MD   REFERRING PROVIDER: Dr Nancy Fetter  REFERRING DIAG: [phone] (ICD-10-CM) - Presence of left artificial hip joint   THERAPY DIAG:  No diagnosis found.  Rationale for Evaluation and Treatment: Rehabilitation  ONSET DATE:   SUBJECTIVE: 4/24 was the original revision surgery. long history of hip dislocations   SUBJECTIVE STATEMENT: The patient has no further signs of dislocation over the past week.  She has returned to the pool.  She has been swimming.  She has some pain in her left shoulder blade.   PERTINENT HISTORY: Right shoulder pain; right knee pain; Right Knee OA ;  RA; connection tissue disease overlap syndrome. Charcot Foot   PAIN:  Are you having pain? Yes: NPRS scale: 3-4/10 Pain location: Left shoulder blade Pain description: grinding  Aggravating factors: swimming; waking without support   Relieving factors: not doing things that     PRECAUTIONS: Other: hip : do not cross baseline   WEIGHT BEARING RESTRICTIONS: No  FALLS:  Has patient fallen in last 6 months? No  LIVING ENVIRONMENT: 2 steps in the house and 2 spots around the house in various areas  OCCUPATION:  Retired   PLOF: Independent with community mobility with device  PATIENT GOALS: to walk better   NEXT MD VISIT: 6 months   OBJECTIVE:   DIAGNOSTIC FINDINGS: Post op: Hip intact per patient   POSTURE: flexed posture left knee valgus   LOWER EXTREMITY ROM:  Active ROM Right eval Left eval  Hip flexion  WNL   Hip extension    Hip abduction    Hip adduction    Hip internal rotation    Hip external rotation    Knee flexion  Painful end range   Knee extension    Ankle dorsiflexion    Ankle plantarflexion    Ankle inversion    Ankle eversion     (Blank rows = not tested)  LOWER EXTREMITY MMT:  MMT Right eval Left  eval Right  left Rt/Lt (lb) 6/7 Right  8/9 Left 8/9  Hip flexion 13.9 10  11.6 16.6/25.8 17.3 22.6  Hip extension         Hip abduction 16.8 11.4  11 9.5 / 3-/5 28.2 17.4  Hip adduction         Hip internal rotation         Hip external rotation         Knee flexion         Knee extension 21.7 19  20.3 27.2/24.2 42.0 37.6  Ankle dorsiflexion         Ankle plantarflexion         Ankle inversion         Ankle eversion          (Blank rows = not tested)  ER/IR WFL Right 90 Shoulder flexion Left 120 shoulder flexion     FUNCTIONAL TESTS:  Will perform 5x sit tostand test next visit.  Sit to stand: uses hands   5x sit to stand test 5x 10sec   GAIT: Signifcant left trendelenburgh gait   TODAY'S TREATMENT:                                                                                                                               DATE:   8/9     8/2 Recumbent bike 5 minutes level 1  SAQ 2 x 20 each leg 3 pounds Patient requested to try straight leg raise.  Surprisingly she had less difficulty on the left leg than the right leg.  3 x 10 3 pounds bilateral  Long arc quad 2 x 25 pounds  Sidestepping at bar 3 laps 2 times with cueing to take short steps and stand straight, engage glutes.  Forward and backward stepping right upper extremity support.  2 X 15  7/27 Recumbent bike 5 minutes level 1 Patient told therapist about dislocation while on recumbent bike. She reported itfelt good. Since she was not having a problem therapy let patient continue.   Quad set 2x20 bilateral SAQ 2 lbs 2x15 bilateral   LAQ 2x15 2lbs   Standing weight shift 2x15  Standing left leg march 2x15      7/12 Recumbent bike 5 minutes level 5  Supine march 2 x 15-second set with yellow band Supine hip abduction 2 x 15 yellow band left hip abduction noted Posterior pelvic tilt 2 x 15  Sidestepping at the bar with emphasis on slowing down movement and single leg stance on the left.  6 steps left and right x 10laps  Weight shift with emphasis on glutes squeeze 2 x 15 Standing left hip march 2 x 15     Treatment                            7/6:  Bike yellow band at knees Standing row with glut  set- both arms pull and alternating Bridge with cues for Lt glut activation Seated round back with feet on floor   6/28  Ex-bike: 5 min L5  PPT 2x15   Supine march 2x10 reviewed progression    6/19 LAQ 5 lbs left 2x20  LAQ 5lbs right 2x15   Manual: trigger point release to lumbar spine; reviewed self soft tissue with thera-cane   Side stepping with cuing to keep strides small and posture straight 3 laps with seated rest break  Standing hip abduction 2x10    PATIENT EDUCATION:  Education details: HEP, symptom  management  Person educated: Patient Education method: Explanation, Demonstration, Tactile cues, Verbal cues, and Handouts Education comprehension: verbalized understanding, returned demonstration, verbal cues required, tactile cues required, and needs further education  HOME EXERCISE PROGRAM: Access Code: [email or web address] URL: [email or web address]   ASSESSMENT:  CLINICAL IMPRESSION: We were able to resume patient's exercises she had no signs of dislocation or no discomfort in her hip.  She continues to require some cueing to stay straight.  As expected she has increased difficulty weightbearing on the left.  Surprisingly she had less difficulty with straight leg raise on the left.  Therapy will continue to progress her exercises as she tolerates.  Will perform reassessment next visit.   OBJECTIVE IMPAIRMENTS: Abnormal gait, decreased activity tolerance, decreased balance, decreased endurance, decreased knowledge of use of DME, decreased mobility, difficulty walking, decreased ROM, decreased strength, increased fascial restrictions, increased muscle spasms, postural dysfunction, and pain.   ACTIVITY LIMITATIONS: carrying, lifting, bending, standing, squatting, stairs, transfers, and locomotion level  PARTICIPATION LIMITATIONS: meal prep, cleaning, laundry, driving, shopping, community activity, and yard work  PERSONAL FACTORS: 3+ comorbidities: RA ; multiple hip dislocations; right knee OA; bilateral RTC tears   are also affecting patient's functional outcome.   REHAB POTENTIAL: Good  CLINICAL DECISION MAKING: Evolving/moderate complexity  EVALUATION COMPLEXITY: Moderate   GOALS: Goals reviewed with patient? Yes   SHORT TERM GOALS: Target date: 12/09/2022   Patient will be independent with basic HEP for hip strengthening with no pain or instability  Baseline: Goal status: Ongoing (Met with aquatics) 12/29/22  2.  Patient will increase gross bilateral hip strength by 5  pounds Baseline:  Goal status: partially met, unable to lift Rt Lt in abd against gravity  3.  Patient will ambulate 500 feet with upright standing posture and minimal pain with least restrictive assistive device Baseline:  Goal status: ongoing   LONG TERM GOALS: Target date: POC date    Patient will return to swimming with minimal pain Baseline:  Goal status:achieved  2.  Patient will ambulate community distances with least restrictive assistive device and good posture Baseline:  Goal status: INITIAL  3.  Patient will be independent with complete program for core stability, posture, and to maximize potential to increase gait distance Baseline:  Goal status: INITIAL  4.  Able to ambulate without valgus support through knees Baseline:  Goal status: INITIAL    PLAN:  PT FREQUENCY: 2x/week  PT DURATION: 8 weeks  PLANNED INTERVENTIONS: Therapeutic exercises, Therapeutic activity, Neuromuscular re-education, Balance training, Gait training, Patient/Family education, Self Care, Joint mobilization (of the shoulder), Stair training, Aquatic Therapy, Dry Needling, Cryotherapy, Moist heat, Taping, and Manual therapy  PLAN FOR NEXT SESSION: Aquatics: Work on gait stability, bilateral hip strengthening, core stability Land: Develop base HEP patient has pain lying supine in her back focus more on standing exercises if tolerated.  Get patient on exercise bike and/or NuStep for self guided gym  program  Lorayne Bender PT DPT 03/11/23 12:01 PM

## 2023-03-12 ENCOUNTER — Encounter (HOSPITAL_BASED_OUTPATIENT_CLINIC_OR_DEPARTMENT_OTHER): Payer: Self-pay | Admitting: Physical Therapy

## 2023-03-17 ENCOUNTER — Ambulatory Visit (HOSPITAL_BASED_OUTPATIENT_CLINIC_OR_DEPARTMENT_OTHER): Payer: Medicare Other | Admitting: Physical Therapy

## 2023-03-17 ENCOUNTER — Encounter (HOSPITAL_BASED_OUTPATIENT_CLINIC_OR_DEPARTMENT_OTHER): Payer: Self-pay | Admitting: Physical Therapy

## 2023-03-17 DIAGNOSIS — R2689 Other abnormalities of gait and mobility: Secondary | ICD-10-CM | POA: Diagnosis not present

## 2023-03-17 DIAGNOSIS — M25551 Pain in right hip: Secondary | ICD-10-CM

## 2023-03-17 DIAGNOSIS — M25552 Pain in left hip: Secondary | ICD-10-CM | POA: Diagnosis not present

## 2023-03-17 DIAGNOSIS — M545 Low back pain, unspecified: Secondary | ICD-10-CM | POA: Diagnosis not present

## 2023-03-17 DIAGNOSIS — M25511 Pain in right shoulder: Secondary | ICD-10-CM | POA: Diagnosis not present

## 2023-03-17 DIAGNOSIS — G8929 Other chronic pain: Secondary | ICD-10-CM | POA: Diagnosis not present

## 2023-03-17 NOTE — Therapy (Signed)
OUTPATIENT PHYSICAL THERAPY LOWER EXTREMITY Progress Note     Patient Name: Virginia Gordon MRN: [id number] DOB:January 21, 1951, 72 y.o., female Today's Date: 03/17/2023  END OF SESSION:  PT End of Session - 03/17/23 1146     Visit Number 21    Number of Visits 36    Date for PT Re-Evaluation 05/06/23    Authorization Type MCR USAA    PT Start Time 1145    PT Stop Time 1226    PT Time Calculation (min) 41 min    Activity Tolerance Patient tolerated treatment well    Behavior During Therapy [facility] for tasks assessed/performed           Progress Note Reporting Period 5/22 to 8/10  See note below for Objective Data and Assessment of Progress/Goals.        Past Medical History:  Diagnosis Date   Rheumatoid arthritis (HCC)    Past Surgical History:  Procedure Laterality Date   BREAST BIOPSY Left    pt not sure of date.   HIP SURGERY     SHOULDER SURGERY     Patient Active Problem List   Diagnosis Date Noted   Effusion of left knee joint 08/17/2021   ADD (attention deficit disorder) 11/13/2015   Connective tissue disease overlap syndrome (HCC) 11/13/2015   Menopausal and perimenopausal disorder 11/13/2015   Acne erythematosa 11/13/2015   Disordered sleep 11/13/2015   History of operative procedure on hip 07/03/2015   Left knee pain 05/20/2014   Right foot pain 05/14/2014   Bilateral shoulder pain 05/14/2014   Hypothyroidism 03/08/2014   Degenerative arthritis of hip 03/07/2012    PCP: Maple Hudson MD   REFERRING PROVIDER: Dr Nancy Fetter  REFERRING DIAG: [phone] (ICD-10-CM) - Presence of left artificial hip joint   THERAPY DIAG:  Chronic bilateral low back pain without sciatica  Pain in right hip  Pain in left hip  Other abnormalities of gait and mobility  Rationale for Evaluation and Treatment: Rehabilitation  ONSET DATE:   SUBJECTIVE: 4/24 was the original revision surgery. long history of hip dislocations   SUBJECTIVE STATEMENT: The patient  continues to report soreness in her back.  She is looking forward to have her her a nerve ablation done.  She continues to work on her exercises in the pool.  She no new issues at this time. PERTINENT HISTORY: Right shoulder pain; right knee pain; Right Knee OA ; RA; connection tissue disease overlap syndrome. Charcot Foot   PAIN:  Are you having pain? Yes: NPRS scale: 3-4/10 Pain location: Left shoulder blade Pain description: grinding  Aggravating factors: swimming; waking without support   Relieving factors: not doing things that     PRECAUTIONS: Other: hip : do not cross baseline   WEIGHT BEARING RESTRICTIONS: No  FALLS:  Has patient fallen in last 6 months? No  LIVING ENVIRONMENT: 2 steps in the house and 2 spots around the house in various areas  OCCUPATION:  Retired   PLOF: Independent with community mobility with device  PATIENT GOALS: to walk better   NEXT MD VISIT: 6 months   OBJECTIVE:   DIAGNOSTIC FINDINGS: Post op: Hip intact per patient   POSTURE: flexed posture left knee valgus   LOWER EXTREMITY ROM:  Active ROM Right eval Left eval  Hip flexion  WNL   Hip extension    Hip abduction    Hip adduction    Hip internal rotation    Hip external rotation    Knee flexion  Painful end range   Knee extension    Ankle dorsiflexion    Ankle plantarflexion    Ankle inversion    Ankle eversion     (Blank rows = not tested)  LOWER EXTREMITY MMT:  MMT Right eval Left eval Right  left Rt/Lt (lb) 6/7 Right  8/9 Left 8/9  Hip flexion 13.9 10  11.6 16.6/25.8 17.3 22.6  Hip extension         Hip abduction 16.8 11.4  11 9.5 / 3-/5 28.2 17.4  Hip adduction         Hip internal rotation         Hip external rotation         Knee flexion         Knee extension 21.7 19  20.3 27.2/24.2 42.0 37.6  Ankle dorsiflexion         Ankle plantarflexion         Ankle inversion         Ankle eversion          (Blank rows = not tested)  ER/IR WFL Right 90  Shoulder flexion Left 120 shoulder flexion     FUNCTIONAL TESTS:  Will perform 5x sit tostand test next visit.  Sit to stand: uses hands   5x sit to stand test 5x 10sec   GAIT: Signifcant left trendelenburgh gait   TODAY'S TREATMENT:                                                                                                                              DATE:   Treatment                            8/15:  Supine with one leg straight and glut set  Manual STM to Lt HS- rolling after prone exercises Prone Hs curls, ab +glut set- all over single pillow Standing with right foot very slightly forward with Lt glut med activation Disussed stanidng in pool and pushing down noodles with arms for core activation as well as prone planks and core stability to hold noodle.   8/9 Reviewed tests and measures.  Reviewed strength testing.  Seated long arc quad 2 x 15 5 pounds Reviewed goals with patient  Gym triceps press down with course stabilization 3 x 15 40 pound Attempted Cybex row machine and life fitness row machine.  Patient could not do lowest weights with both machines.  Exercise bike with band around knees 6 minutes level 4  Reviewed POC going forward. Reviewed current HEP    8/2 Recumbent bike 5 minutes level 1  SAQ 2 x 20 each leg 3 pounds Patient requested to try straight leg raise.  Surprisingly she had less difficulty on the left leg than the right leg.  3 x 10 3 pounds bilateral  Long arc quad 2 x 25 pounds  Sidestepping at bar 3 laps  2 times with cueing to take short steps and stand straight, engage glutes.  Forward and backward stepping right upper extremity support.  2 X 15  7/27 Recumbent bike 5 minutes level 1 Patient told therapist about dislocation while on recumbent bike. She reported itfelt good. Since she was not having a problem therapy let patient continue.   Quad set 2x20 bilateral SAQ 2 lbs 2x15 bilateral   LAQ 2x15 2lbs   Standing  weight shift 2x15  Standing left leg march 2x15      7/12 Recumbent bike 5 minutes level 5  Supine march 2 x 15-second set with yellow band Supine hip abduction 2 x 15 yellow band left hip abduction noted Posterior pelvic tilt 2 x 15  Sidestepping at the bar with emphasis on slowing down movement and single leg stance on the left.  6 steps left and right x 10laps  Weight shift with emphasis on glutes squeeze 2 x 15 Standing left hip march 2 x 15     Treatment                            7/6:  Bike yellow band at knees Standing row with glut set- both arms pull and alternating Bridge with cues for Lt glut activation Seated round back with feet on floor   6/28  Ex-bike: 5 min L5  PPT 2x15   Supine march 2x10 reviewed progression    6/19 LAQ 5 lbs left 2x20  LAQ 5lbs right 2x15   Manual: trigger point release to lumbar spine; reviewed self soft tissue with thera-cane   Side stepping with cuing to keep strides small and posture straight 3 laps with seated rest break  Standing hip abduction 2x10    PATIENT EDUCATION:  Education details: HEP, symptom management  Person educated: Patient Education method: Explanation, Demonstration, Tactile cues, Verbal cues, and Handouts Education comprehension: verbalized understanding, returned demonstration, verbal cues required, tactile cues required, and needs further education  HOME EXERCISE PROGRAM: Access Code: [email or web address] URL: [email or web address]   ASSESSMENT:  CLINICAL IMPRESSION: Limited in upright posture by lack of hip flexor length. Trigger point released in Lt hamstrings decreased concordant buttock pain. Very slight step forward with right foot to activate Lt glut med but stressed the importance of gradual progression to ensure balance of flexibility & stability to reduce dislocation risk.    OBJECTIVE IMPAIRMENTS: Abnormal gait, decreased activity tolerance, decreased balance, decreased  endurance, decreased knowledge of use of DME, decreased mobility, difficulty walking, decreased ROM, decreased strength, increased fascial restrictions, increased muscle spasms, postural dysfunction, and pain.   ACTIVITY LIMITATIONS: carrying, lifting, bending, standing, squatting, stairs, transfers, and locomotion level  PARTICIPATION LIMITATIONS: meal prep, cleaning, laundry, driving, shopping, community activity, and yard work  PERSONAL FACTORS: 3+ comorbidities: RA ; multiple hip dislocations; right knee OA; bilateral RTC tears   are also affecting patient's functional outcome.   REHAB POTENTIAL: Good  CLINICAL DECISION MAKING: Evolving/moderate complexity  EVALUATION COMPLEXITY: Moderate   GOALS: Goals reviewed with patient? Yes   SHORT TERM GOALS: Target date: 12/09/2022   Patient will be independent with basic HEP for hip strengthening with no pain or instability  Baseline: Goal status: Ongoing (Met with aquatics) 12/29/22  2.  Patient will increase gross bilateral hip strength by 5 pounds Baseline:  Goal status: partially met, unable to lift Rt Lt in abd against gravity  3.  Patient will ambulate 500 feet with upright standing posture and  minimal pain with least restrictive assistive device Baseline:  Goal status: ongoing   LONG TERM GOALS: Target date: POC date    Patient will return to swimming with minimal pain Baseline:  Goal status:achieved  2.  Patient will ambulate community distances with least restrictive assistive device and good posture Baseline:  Goal status: progressing 8/10  3.  Patient will be independent with complete program for core stability, posture, and to maximize potential to increase gait distance Baseline:  Goal status better: 8/10   4.  Able to ambulate without valgus support through knees Baseline:  Goal status: not changed  8/10    PLAN:  PT FREQUENCY: 2x/week  PT DURATION: 8 weeks  PLANNED INTERVENTIONS: Therapeutic  exercises, Therapeutic activity, Neuromuscular re-education, Balance training, Gait training, Patient/Family education, Self Care, Joint mobilization (of the shoulder), Stair training, Aquatic Therapy, Dry Needling, Cryotherapy, Moist heat, Taping, and Manual therapy  PLAN FOR NEXT SESSION: Aquatics: Work on gait stability, bilateral hip strengthening, core stability Land: Continue to work on standing exercises and stepping exercises.  Continue to develop home program.  Continue to load current exercises to the appropriate difficulty.  Continue to work on not crossing midline per MD recommendations.   C.  PT, DPT 03/17/23 1:02 PM

## 2023-03-22 DIAGNOSIS — M47816 Spondylosis without myelopathy or radiculopathy, lumbar region: Secondary | ICD-10-CM | POA: Diagnosis not present

## 2023-03-25 ENCOUNTER — Ambulatory Visit (HOSPITAL_BASED_OUTPATIENT_CLINIC_OR_DEPARTMENT_OTHER): Payer: Medicare Other | Admitting: Physical Therapy

## 2023-03-25 ENCOUNTER — Encounter (HOSPITAL_BASED_OUTPATIENT_CLINIC_OR_DEPARTMENT_OTHER): Payer: Self-pay | Admitting: Physical Therapy

## 2023-03-25 ENCOUNTER — Encounter (HOSPITAL_BASED_OUTPATIENT_CLINIC_OR_DEPARTMENT_OTHER): Payer: Self-pay

## 2023-03-25 DIAGNOSIS — M25551 Pain in right hip: Secondary | ICD-10-CM

## 2023-03-25 DIAGNOSIS — M25511 Pain in right shoulder: Secondary | ICD-10-CM | POA: Diagnosis not present

## 2023-03-25 DIAGNOSIS — R2689 Other abnormalities of gait and mobility: Secondary | ICD-10-CM | POA: Diagnosis not present

## 2023-03-25 DIAGNOSIS — M545 Low back pain, unspecified: Secondary | ICD-10-CM | POA: Diagnosis not present

## 2023-03-25 DIAGNOSIS — G8929 Other chronic pain: Secondary | ICD-10-CM | POA: Diagnosis not present

## 2023-03-25 DIAGNOSIS — M25552 Pain in left hip: Secondary | ICD-10-CM

## 2023-03-25 NOTE — Therapy (Signed)
OUTPATIENT PHYSICAL THERAPY LOWER EXTREMITY Progress Note     Patient Name: Virginia Gordon MRN: [id number] DOB:1951/03/06, 72 y.o., female Today's Date: 03/25/2023  END OF SESSION:  PT End of Session - 03/25/23 1227     Visit Number 22    Number of Visits 36    PT Start Time 1145    PT Stop Time 1228    PT Time Calculation (min) 43 min    Activity Tolerance Patient tolerated treatment well    Behavior During Therapy [facility] for tasks assessed/performed            Progress Note Reporting Period 5/22 to 8/10  See note below for Objective Data and Assessment of Progress/Goals.        Past Medical History:  Diagnosis Date   Rheumatoid arthritis (HCC)    Past Surgical History:  Procedure Laterality Date   BREAST BIOPSY Left    pt not sure of date.   HIP SURGERY     SHOULDER SURGERY     Patient Active Problem List   Diagnosis Date Noted   Effusion of left knee joint 08/17/2021   ADD (attention deficit disorder) 11/13/2015   Connective tissue disease overlap syndrome (HCC) 11/13/2015   Menopausal and perimenopausal disorder 11/13/2015   Acne erythematosa 11/13/2015   Disordered sleep 11/13/2015   History of operative procedure on hip 07/03/2015   Left knee pain 05/20/2014   Right foot pain 05/14/2014   Bilateral shoulder pain 05/14/2014   Hypothyroidism 03/08/2014   Degenerative arthritis of hip 03/07/2012    PCP: Maple Hudson MD   REFERRING PROVIDER: Dr Nancy Fetter  REFERRING DIAG: [phone] (ICD-10-CM) - Presence of left artificial hip joint   THERAPY DIAG:  Chronic bilateral low back pain without sciatica  Pain in right hip  Pain in left hip  Other abnormalities of gait and mobility  Chronic right shoulder pain  Rationale for Evaluation and Treatment: Rehabilitation  ONSET DATE:   SUBJECTIVE: 4/24 was the original revision surgery. long history of hip dislocations   SUBJECTIVE STATEMENT: The patient had her ablation on one side. She feels like  it ias a little better.   PERTINENT HISTORY: Right shoulder pain; right knee pain; Right Knee OA ; RA; connection tissue disease overlap syndrome. Charcot Foot   PAIN:  Are you having pain? Yes: NPRS scale: 3-4/10 Pain location: Left shoulder blade Pain description: grinding  Aggravating factors: swimming; waking without support   Relieving factors: not doing things that     PRECAUTIONS: Other: hip : do not cross baseline   WEIGHT BEARING RESTRICTIONS: No  FALLS:  Has patient fallen in last 6 months? No  LIVING ENVIRONMENT: 2 steps in the house and 2 spots around the house in various areas  OCCUPATION:  Retired   PLOF: Independent with community mobility with device  PATIENT GOALS: to walk better   NEXT MD VISIT: 6 months   OBJECTIVE:   DIAGNOSTIC FINDINGS: Post op: Hip intact per patient   POSTURE: flexed posture left knee valgus   LOWER EXTREMITY ROM:  Active ROM Right eval Left eval  Hip flexion  WNL   Hip extension    Hip abduction    Hip adduction    Hip internal rotation    Hip external rotation    Knee flexion  Painful end range   Knee extension    Ankle dorsiflexion    Ankle plantarflexion    Ankle inversion    Ankle eversion     (Blank  rows = not tested)  LOWER EXTREMITY MMT:  MMT Right eval Left eval Right  left Rt/Lt (lb) 6/7 Right  8/9 Left 8/9  Hip flexion 13.9 10  11.6 16.6/25.8 17.3 22.6  Hip extension         Hip abduction 16.8 11.4  11 9.5 / 3-/5 28.2 17.4  Hip adduction         Hip internal rotation         Hip external rotation         Knee flexion         Knee extension 21.7 19  20.3 27.2/24.2 42.0 37.6  Ankle dorsiflexion         Ankle plantarflexion         Ankle inversion         Ankle eversion          (Blank rows = not tested)  ER/IR WFL Right 90 Shoulder flexion Left 120 shoulder flexion     FUNCTIONAL TESTS:  Will perform 5x sit tostand test next visit.  Sit to stand: uses hands   5x sit to stand  test 5x 10sec   GAIT: Signifcant left trendelenburgh gait   TODAY'S TREATMENT:                                                                                                                              DATE:   8/23 Ex bike 5 min    LAQ 2x15 5 lbs   Standing hip 3 way x15 attempted 3 pounds but moved down to 2 lbs mod cuing for technique   Reviewed exercises from last visit.   Reviewed HEP    Treatment                            8/15:  Supine with one leg straight and glut set  Manual STM to Lt HS- rolling after prone exercises Prone Hs curls, ab +glut set- all over single pillow Standing with right foot very slightly forward with Lt glut med activation Disussed stanidng in pool and pushing down noodles with arms for core activation as well as prone planks and core stability to hold noodle.   8/9 Reviewed tests and measures.  Reviewed strength testing.  Seated long arc quad 2 x 15 5 pounds Reviewed goals with patient  Gym triceps press down with course stabilization 3 x 15 40 pound Attempted Cybex row machine and life fitness row machine.  Patient could not do lowest weights with both machines.  Exercise bike with band around knees 6 minutes level 4  Reviewed POC going forward. Reviewed current HEP     PATIENT EDUCATION:  Education details: HEP, symptom management  Person educated: Patient Education method: Explanation, Demonstration, Tactile cues, Verbal cues, and Handouts Education comprehension: verbalized understanding, returned demonstration, verbal cues required, tactile cues required, and needs further education  HOME EXERCISE PROGRAM: Access  Code: [email or web address] URL: [email or web address]   ASSESSMENT:  CLINICAL IMPRESSION: Patient tolerated treatment well.  She has new walker which allows her to stand more upright.  She had no significant increase in pain with her exercises.  We worked on a standing series to work her left hip.  We reviewed  RPE.  She felt that 2 pounds was challenging but not too hard.  We also worked on posture with her exercises.  We will continue to progress as tolerated.  OBJECTIVE IMPAIRMENTS: Abnormal gait, decreased activity tolerance, decreased balance, decreased endurance, decreased knowledge of use of DME, decreased mobility, difficulty walking, decreased ROM, decreased strength, increased fascial restrictions, increased muscle spasms, postural dysfunction, and pain.   ACTIVITY LIMITATIONS: carrying, lifting, bending, standing, squatting, stairs, transfers, and locomotion level  PARTICIPATION LIMITATIONS: meal prep, cleaning, laundry, driving, shopping, community activity, and yard work  PERSONAL FACTORS: 3+ comorbidities: RA ; multiple hip dislocations; right knee OA; bilateral RTC tears   are also affecting patient's functional outcome.   REHAB POTENTIAL: Good  CLINICAL DECISION MAKING: Evolving/moderate complexity  EVALUATION COMPLEXITY: Moderate   GOALS: Goals reviewed with patient? Yes   SHORT TERM GOALS: Target date: 12/09/2022   Patient will be independent with basic HEP for hip strengthening with no pain or instability  Baseline: Goal status: Ongoing (Met with aquatics) 12/29/22  2.  Patient will increase gross bilateral hip strength by 5 pounds Baseline:  Goal status: partially met, unable to lift Rt Lt in abd against gravity  3.  Patient will ambulate 500 feet with upright standing posture and minimal pain with least restrictive assistive device Baseline:  Goal status: ongoing   LONG TERM GOALS: Target date: POC date    Patient will return to swimming with minimal pain Baseline:  Goal status:achieved  2.  Patient will ambulate community distances with least restrictive assistive device and good posture Baseline:  Goal status: progressing 8/10  3.  Patient will be independent with complete program for core stability, posture, and to maximize potential to increase gait  distance Baseline:  Goal status better: 8/10   4.  Able to ambulate without valgus support through knees Baseline:  Goal status: not changed  8/10    PLAN:  PT FREQUENCY: 2x/week  PT DURATION: 8 weeks  PLANNED INTERVENTIONS: Therapeutic exercises, Therapeutic activity, Neuromuscular re-education, Balance training, Gait training, Patient/Family education, Self Care, Joint mobilization (of the shoulder), Stair training, Aquatic Therapy, Dry Needling, Cryotherapy, Moist heat, Taping, and Manual therapy  PLAN FOR NEXT SESSION: Aquatics: Work on gait stability, bilateral hip strengthening, core stability Land: Continue to work on standing exercises and stepping exercises.  Continue to develop home program.  Continue to load current exercises to the appropriate difficulty.  Continue to work on not crossing midline per MD recommendations.  Lorayne Bender PT DPT 03/25/23 12:29 PM

## 2023-03-28 ENCOUNTER — Ambulatory Visit (HOSPITAL_BASED_OUTPATIENT_CLINIC_OR_DEPARTMENT_OTHER): Payer: Medicare Other | Admitting: Physical Therapy

## 2023-03-28 ENCOUNTER — Encounter (HOSPITAL_BASED_OUTPATIENT_CLINIC_OR_DEPARTMENT_OTHER): Payer: Self-pay | Admitting: Physical Therapy

## 2023-03-28 DIAGNOSIS — M25511 Pain in right shoulder: Secondary | ICD-10-CM | POA: Diagnosis not present

## 2023-03-28 DIAGNOSIS — M25551 Pain in right hip: Secondary | ICD-10-CM

## 2023-03-28 DIAGNOSIS — R2689 Other abnormalities of gait and mobility: Secondary | ICD-10-CM | POA: Diagnosis not present

## 2023-03-28 DIAGNOSIS — M25552 Pain in left hip: Secondary | ICD-10-CM

## 2023-03-28 DIAGNOSIS — G8929 Other chronic pain: Secondary | ICD-10-CM | POA: Diagnosis not present

## 2023-03-28 DIAGNOSIS — M545 Low back pain, unspecified: Secondary | ICD-10-CM | POA: Diagnosis not present

## 2023-03-28 NOTE — Therapy (Signed)
Opened in error

## 2023-03-28 NOTE — Therapy (Signed)
OUTPATIENT PHYSICAL THERAPY LOWER EXTREMITY Progress Note     Patient Name: Virginia Gordon MRN: [id number] DOB:08-05-1950, 72 y.o., female Today's Date: 03/28/2023  END OF SESSION:  PT End of Session - 03/28/23 1026     Visit Number 23    Number of Visits 36    Date for PT Re-Evaluation 05/06/23    PT Start Time 1025    PT Stop Time 1059    PT Time Calculation (min) 34 min    Activity Tolerance Patient tolerated treatment well    Behavior During Therapy [facility] for tasks assessed/performed            Progress Note Reporting Period 5/22 to 8/10  See note below for Objective Data and Assessment of Progress/Goals.        Past Medical History:  Diagnosis Date   Rheumatoid arthritis (HCC)    Past Surgical History:  Procedure Laterality Date   BREAST BIOPSY Left    pt not sure of date.   HIP SURGERY     SHOULDER SURGERY     Patient Active Problem List   Diagnosis Date Noted   Effusion of left knee joint 08/17/2021   ADD (attention deficit disorder) 11/13/2015   Connective tissue disease overlap syndrome (HCC) 11/13/2015   Menopausal and perimenopausal disorder 11/13/2015   Acne erythematosa 11/13/2015   Disordered sleep 11/13/2015   History of operative procedure on hip 07/03/2015   Left knee pain 05/20/2014   Right foot pain 05/14/2014   Bilateral shoulder pain 05/14/2014   Hypothyroidism 03/08/2014   Degenerative arthritis of hip 03/07/2012    PCP: Maple Hudson MD   REFERRING PROVIDER: Dr Nancy Fetter  REFERRING DIAG: [phone] (ICD-10-CM) - Presence of left artificial hip joint   THERAPY DIAG:  Chronic bilateral low back pain without sciatica  Pain in right hip  Pain in left hip  Rationale for Evaluation and Treatment: Rehabilitation  ONSET DATE:   SUBJECTIVE: 4/24 was the original revision surgery. long history of hip dislocations   SUBJECTIVE STATEMENT: The patient had her ablation on one side. She feels like it ias a little better.    PERTINENT HISTORY: Right shoulder pain; right knee pain; Right Knee OA ; RA; connection tissue disease overlap syndrome. Charcot Foot   PAIN:  Are you having pain? Yes: NPRS scale: 3-4/10 Pain location: Left shoulder blade Pain description: grinding  Aggravating factors: swimming; waking without support   Relieving factors: not doing things that     PRECAUTIONS: Other: hip : do not cross baseline   WEIGHT BEARING RESTRICTIONS: No  FALLS:  Has patient fallen in last 6 months? No  LIVING ENVIRONMENT: 2 steps in the house and 2 spots around the house in various areas  OCCUPATION:  Retired   PLOF: Independent with community mobility with device  PATIENT GOALS: to walk better   NEXT MD VISIT: 6 months   OBJECTIVE:   DIAGNOSTIC FINDINGS: Post op: Hip intact per patient   POSTURE: flexed posture left knee valgus   LOWER EXTREMITY ROM:  Active ROM Right eval Left eval  Hip flexion  WNL   Hip extension    Hip abduction    Hip adduction    Hip internal rotation    Hip external rotation    Knee flexion  Painful end range   Knee extension    Ankle dorsiflexion    Ankle plantarflexion    Ankle inversion    Ankle eversion     (Blank rows = not tested)  LOWER EXTREMITY MMT:  MMT Right eval Left eval Right  left Rt/Lt (lb) 6/7 Right  8/9 Left 8/9  Hip flexion 13.9 10  11.6 16.6/25.8 17.3 22.6  Hip extension         Hip abduction 16.8 11.4  11 9.5 / 3-/5 28.2 17.4  Hip adduction         Hip internal rotation         Hip external rotation         Knee flexion         Knee extension 21.7 19  20.3 27.2/24.2 42.0 37.6  Ankle dorsiflexion         Ankle plantarflexion         Ankle inversion         Ankle eversion          (Blank rows = not tested)  ER/IR WFL Right 90 Shoulder flexion Left 120 shoulder flexion     FUNCTIONAL TESTS:  Will perform 5x sit tostand test next visit.  Sit to stand: uses hands   5x sit to stand test 5x 10sec    GAIT: Signifcant left trendelenburgh gait   TODAY'S TREATMENT:                                                                                                                              DATE:   Treatment                            8/26:  Reviewed 8/15 exercises Plie in turnout- tiny bend, Lt hip forward, squeeze gluts Supine alt clam- yellow tband with pillow bw knees Supine mini bridge- pillow bw knees + yellow tband; press left hip forward & left knee out    8/23 Ex bike 5 min    LAQ 2x15 5 lbs   Standing hip 3 way x15 attempted 3 pounds but moved down to 2 lbs mod cuing for technique   Reviewed exercises from last visit.   Reviewed HEP    Treatment                            8/15:  Supine with one leg straight and glut set  Manual STM to Lt HS- rolling after prone exercises Prone Hs curls, ab +glut set- all over single pillow Standing with right foot very slightly forward with Lt glut med activation Disussed stanidng in pool and pushing down noodles with arms for core activation as well as prone planks and core stability to hold noodle.   8/9 Reviewed tests and measures.  Reviewed strength testing.  Seated long arc quad 2 x 15 5 pounds Reviewed goals with patient  Gym triceps press down with course stabilization 3 x 15 40 pound Attempted Cybex row machine and life fitness row machine.  Patient could not do lowest weights with both  machines.  Exercise bike with band around knees 6 minutes level 4  Reviewed POC going forward. Reviewed current HEP     PATIENT EDUCATION:  Education details: HEP, symptom management  Person educated: Patient Education method: Explanation, Demonstration, Tactile cues, Verbal cues, and Handouts Education comprehension: verbalized understanding, returned demonstration, verbal cues required, tactile cues required, and needs further education  HOME EXERCISE PROGRAM: Access Code: [email or web address] URL:  [email or web address]   ASSESSMENT:  CLINICAL IMPRESSION: Unable to tolerate sidelying due pronounced Lt greater trochanter so performed isolated glut med activities in hooklying- beginning from slightly abducted position using pillow which was significantly difficult.   OBJECTIVE IMPAIRMENTS: Abnormal gait, decreased activity tolerance, decreased balance, decreased endurance, decreased knowledge of use of DME, decreased mobility, difficulty walking, decreased ROM, decreased strength, increased fascial restrictions, increased muscle spasms, postural dysfunction, and pain.   ACTIVITY LIMITATIONS: carrying, lifting, bending, standing, squatting, stairs, transfers, and locomotion level  PARTICIPATION LIMITATIONS: meal prep, cleaning, laundry, driving, shopping, community activity, and yard work  PERSONAL FACTORS: 3+ comorbidities: RA ; multiple hip dislocations; right knee OA; bilateral RTC tears   are also affecting patient's functional outcome.   REHAB POTENTIAL: Good  CLINICAL DECISION MAKING: Evolving/moderate complexity  EVALUATION COMPLEXITY: Moderate   GOALS: Goals reviewed with patient? Yes   SHORT TERM GOALS: Target date: 12/09/2022   Patient will be independent with basic HEP for hip strengthening with no pain or instability  Baseline: Goal status: Ongoing (Met with aquatics) 12/29/22  2.  Patient will increase gross bilateral hip strength by 5 pounds Baseline:  Goal status: partially met, unable to lift Rt Lt in abd against gravity  3.  Patient will ambulate 500 feet with upright standing posture and minimal pain with least restrictive assistive device Baseline:  Goal status: ongoing   LONG TERM GOALS: Target date: POC date    Patient will return to swimming with minimal pain Baseline:  Goal status:achieved  2.  Patient will ambulate community distances with least restrictive assistive device and good posture Baseline:  Goal status: progressing  8/10  3.  Patient will be independent with complete program for core stability, posture, and to maximize potential to increase gait distance Baseline:  Goal status better: 8/10   4.  Able to ambulate without valgus support through knees Baseline:  Goal status: not changed  8/10    PLAN:  PT FREQUENCY: 2x/week  PT DURATION: 8 weeks  PLANNED INTERVENTIONS: Therapeutic exercises, Therapeutic activity, Neuromuscular re-education, Balance training, Gait training, Patient/Family education, Self Care, Joint mobilization (of the shoulder), Stair training, Aquatic Therapy, Dry Needling, Cryotherapy, Moist heat, Taping, and Manual therapy  PLAN FOR NEXT SESSION: Aquatics: Work on gait stability, bilateral hip strengthening, core stability Land: Continue to work on standing exercises and stepping exercises.  Continue to develop home program.  Continue to load current exercises to the appropriate difficulty.  Continue to work on not crossing midline per MD recommendations.  Kaylum Shrum C. Evadna Donaghy PT, DPT 03/28/23 11:02 AM

## 2023-04-01 ENCOUNTER — Ambulatory Visit (HOSPITAL_BASED_OUTPATIENT_CLINIC_OR_DEPARTMENT_OTHER): Payer: Medicare Other | Admitting: Physical Therapy

## 2023-04-08 DIAGNOSIS — M47816 Spondylosis without myelopathy or radiculopathy, lumbar region: Secondary | ICD-10-CM | POA: Diagnosis not present

## 2023-04-11 ENCOUNTER — Ambulatory Visit (HOSPITAL_BASED_OUTPATIENT_CLINIC_OR_DEPARTMENT_OTHER): Payer: Medicare Other | Attending: Orthopedic Surgery | Admitting: Physical Therapy

## 2023-04-11 ENCOUNTER — Encounter (HOSPITAL_BASED_OUTPATIENT_CLINIC_OR_DEPARTMENT_OTHER): Payer: Medicare Other | Admitting: Physical Therapy

## 2023-04-11 DIAGNOSIS — Z471 Aftercare following joint replacement surgery: Secondary | ICD-10-CM | POA: Insufficient documentation

## 2023-04-11 DIAGNOSIS — M25551 Pain in right hip: Secondary | ICD-10-CM | POA: Diagnosis not present

## 2023-04-11 DIAGNOSIS — G8929 Other chronic pain: Secondary | ICD-10-CM

## 2023-04-11 DIAGNOSIS — R2689 Other abnormalities of gait and mobility: Secondary | ICD-10-CM | POA: Diagnosis not present

## 2023-04-11 DIAGNOSIS — M25552 Pain in left hip: Secondary | ICD-10-CM | POA: Diagnosis not present

## 2023-04-11 DIAGNOSIS — M5459 Other low back pain: Secondary | ICD-10-CM | POA: Diagnosis not present

## 2023-04-11 DIAGNOSIS — Z96642 Presence of left artificial hip joint: Secondary | ICD-10-CM | POA: Diagnosis not present

## 2023-04-11 NOTE — Therapy (Signed)
OUTPATIENT PHYSICAL THERAPY LOWER EXTREMITY Progress Note     Patient Name: Virginia Gordon MRN: [id number] DOB:12-09-1950, 72 y.o., female Today's Date: 04/11/2023  END OF SESSION:        Past Medical History:  Diagnosis Date   Rheumatoid arthritis (HCC)    Past Surgical History:  Procedure Laterality Date   BREAST BIOPSY Left    pt not sure of date.   HIP SURGERY     SHOULDER SURGERY     Patient Active Problem List   Diagnosis Date Noted   Effusion of left knee joint 08/17/2021   ADD (attention deficit disorder) 11/13/2015   Connective tissue disease overlap syndrome (HCC) 11/13/2015   Menopausal and perimenopausal disorder 11/13/2015   Acne erythematosa 11/13/2015   Disordered sleep 11/13/2015   History of operative procedure on hip 07/03/2015   Left knee pain 05/20/2014   Right foot pain 05/14/2014   Bilateral shoulder pain 05/14/2014   Hypothyroidism 03/08/2014   Degenerative arthritis of hip 03/07/2012    PCP: Maple Hudson MD   REFERRING PROVIDER: Dr Nancy Fetter  REFERRING DIAG: [phone] (ICD-10-CM) - Presence of left artificial hip joint   THERAPY DIAG:  No diagnosis found.  Rationale for Evaluation and Treatment: Rehabilitation  ONSET DATE:   SUBJECTIVE: 4/24 was the original revision surgery. long history of hip dislocations   SUBJECTIVE STATEMENT: Had ablations 2 wk ago and last Friday. I do better standing up I think.   PERTINENT HISTORY: Right shoulder pain; right knee pain; Right Knee OA ; RA; connection tissue disease overlap syndrome. Charcot Foot   PAIN:  Are you having pain? Yes: NPRS scale: 3-4/10 Pain location: Left shoulder blade Pain description: grinding  Aggravating factors: swimming; waking without support   Relieving factors: not doing things that     PRECAUTIONS: Other: hip : do not cross baseline   WEIGHT BEARING RESTRICTIONS: No  FALLS:  Has patient fallen in last 6 months? No  LIVING ENVIRONMENT: 2 steps in the  house and 2 spots around the house in various areas  OCCUPATION:  Retired   PLOF: Independent with community mobility with device  PATIENT GOALS: to walk better   NEXT MD VISIT: 6 months   OBJECTIVE:   DIAGNOSTIC FINDINGS: Post op: Hip intact per patient   POSTURE: flexed posture left knee valgus   LOWER EXTREMITY ROM:  Active ROM Right eval Left eval  Hip flexion  WNL   Hip extension    Hip abduction    Hip adduction    Hip internal rotation    Hip external rotation    Knee flexion  Painful end range   Knee extension    Ankle dorsiflexion    Ankle plantarflexion    Ankle inversion    Ankle eversion     (Blank rows = not tested)  LOWER EXTREMITY MMT:  MMT Right eval Left eval Right  left Rt/Lt (lb) 6/7 Right  8/9 Left 8/9 Rt/Lt 9/9  Hip flexion 13.9 10  11.6 16.6/25.8 17.3 22.6   Hip extension          Hip abduction 16.8 11.4  11 9.5 / 3-/5 28.2 17.4 Sidelying above knee 10.5 lb / 3-/5  Hip adduction          Hip internal rotation          Hip external rotation          Knee flexion          Knee extension 21.7 19  20.3 27.2/24.2 42.0 37.6   Ankle dorsiflexion          Ankle plantarflexion          Ankle inversion          Ankle eversion           (Blank rows = not tested)  ER/IR WFL Right 90 Shoulder flexion Left 120 shoulder flexion     FUNCTIONAL TESTS:  Will perform 5x sit tostand test next visit.  Sit to stand: uses hands   5x sit to stand test 5x 10sec   GAIT: Signifcant left trendelenburgh gait   TODAY'S TREATMENT:                                                                                                                              DATE:   Treatment                            9/9:  Reviewed aqatics ex Gait- heel strike to glut med activation - from perching on side of table   Treatment                            8/26:  Reviewed 8/15 exercises Plie in turnout- tiny bend, Lt hip forward, squeeze gluts Supine alt clam-  yellow tband with pillow bw knees Supine mini bridge- pillow bw knees + yellow tband; press left hip forward & left knee out    8/23 Ex bike 5 min    LAQ 2x15 5 lbs   Standing hip 3 way x15 attempted 3 pounds but moved down to 2 lbs mod cuing for technique   Reviewed exercises from last visit.   Reviewed HEP      PATIENT EDUCATION:  Education details: HEP, symptom management  Person educated: Patient Education method: Explanation, Demonstration, Tactile cues, Verbal cues, and Handouts Education comprehension: verbalized understanding, returned demonstration, verbal cues required, tactile cues required, and needs further education  HOME EXERCISE PROGRAM: Access Code: [email or web address] URL: [email or web address]   ASSESSMENT:  CLINICAL IMPRESSION: Was able to tolerate progression of strength testing hip abd in sidelying. Discussed idea of KAFO or HKAFO as genu valgus flexibility is likely limiting hip abd activation in [facility]. Will do pool on Wed with belt for upright lift and heel strike/glut med activation.   OBJECTIVE IMPAIRMENTS: Abnormal gait, decreased activity tolerance, decreased balance, decreased endurance, decreased knowledge of use of DME, decreased mobility, difficulty walking, decreased ROM, decreased strength, increased fascial restrictions, increased muscle spasms, postural dysfunction, and pain.   ACTIVITY LIMITATIONS: carrying, lifting, bending, standing, squatting, stairs, transfers, and locomotion level  PARTICIPATION LIMITATIONS: meal prep, cleaning, laundry, driving, shopping, community activity, and yard work  PERSONAL FACTORS: 3+ comorbidities: RA ; multiple hip dislocations; right knee OA; bilateral RTC tears   are also affecting patient's functional outcome.   REHAB POTENTIAL: Good  CLINICAL  DECISION MAKING: Evolving/moderate complexity  EVALUATION COMPLEXITY: Moderate   GOALS: Goals reviewed with patient? Yes   SHORT TERM GOALS: Target  date: 12/09/2022   Patient will be independent with basic HEP for hip strengthening with no pain or instability  Baseline: Goal status: Ongoing (Met with aquatics) 12/29/22  2.  Patient will increase gross bilateral hip strength by 5 pounds Baseline:  Goal status: partially met, unable to lift Rt Lt in abd against gravity  3.  Patient will ambulate 500 feet with upright standing posture and minimal pain with least restrictive assistive device Baseline:  Goal status: ongoing   LONG TERM GOALS: Target date: POC date    Patient will return to swimming with minimal pain Baseline:  Goal status:achieved  2.  Patient will ambulate community distances with least restrictive assistive device and good posture Baseline:  Goal status: progressing 8/10  3.  Patient will be independent with complete program for core stability, posture, and to maximize potential to increase gait distance Baseline:  Goal status better: 8/10   4.  Able to ambulate without valgus support through knees Baseline:  Goal status: not changed  8/10    PLAN:  PT FREQUENCY: 2x/week  PT DURATION: 8 weeks  PLANNED INTERVENTIONS: Therapeutic exercises, Therapeutic activity, Neuromuscular re-education, Balance training, Gait training, Patient/Family education, Self Care, Joint mobilization (of the shoulder), Stair training, Aquatic Therapy, Dry Needling, Cryotherapy, Moist heat, Taping, and Manual therapy  PLAN FOR NEXT SESSION: Aquatics: Work on gait stability, bilateral hip strengthening, core stability Land: Continue to work on standing exercises and stepping exercises.  Continue to develop home program.  Continue to load current exercises to the appropriate difficulty.  Continue to work on not crossing midline per MD recommendations.  Sabrina Keough C. Chizara Mena PT, DPT 04/11/23 1:22 PM

## 2023-04-12 ENCOUNTER — Encounter (HOSPITAL_BASED_OUTPATIENT_CLINIC_OR_DEPARTMENT_OTHER): Payer: Self-pay | Admitting: Physical Therapy

## 2023-04-13 ENCOUNTER — Ambulatory Visit (HOSPITAL_BASED_OUTPATIENT_CLINIC_OR_DEPARTMENT_OTHER): Payer: Medicare Other | Attending: Orthopedic Surgery | Admitting: Physical Therapy

## 2023-04-13 ENCOUNTER — Encounter (HOSPITAL_BASED_OUTPATIENT_CLINIC_OR_DEPARTMENT_OTHER): Payer: Self-pay | Admitting: Physical Therapy

## 2023-04-13 DIAGNOSIS — G8929 Other chronic pain: Secondary | ICD-10-CM

## 2023-04-13 DIAGNOSIS — M25551 Pain in right hip: Secondary | ICD-10-CM

## 2023-04-13 DIAGNOSIS — M25511 Pain in right shoulder: Secondary | ICD-10-CM | POA: Diagnosis not present

## 2023-04-13 DIAGNOSIS — R2689 Other abnormalities of gait and mobility: Secondary | ICD-10-CM | POA: Diagnosis not present

## 2023-04-13 DIAGNOSIS — M25552 Pain in left hip: Secondary | ICD-10-CM

## 2023-04-13 DIAGNOSIS — M545 Low back pain, unspecified: Secondary | ICD-10-CM | POA: Diagnosis not present

## 2023-04-13 NOTE — Therapy (Signed)
OUTPATIENT PHYSICAL THERAPY LOWER EXTREMITY TREATMENT  Patient Name: Virginia Gordon MRN: [id number] DOB:[birth year], 72 y.o., female Today's Date: 04/13/2023  END OF SESSION:  PT End of Session - 04/13/23 1124     Visit Number 25    Number of Visits 36    Date for PT Re-Evaluation 05/06/23    Authorization Type MCR [employer]    PT Start Time 1120    PT Stop Time 1158    PT Time Calculation (min) 38 min    Activity Tolerance Patient tolerated treatment well                  Past Medical History:  Diagnosis Date   Rheumatoid arthritis (HCC)    Past Surgical History:  Procedure Laterality Date   BREAST BIOPSY Left    pt not sure of date.   HIP SURGERY     SHOULDER SURGERY     Patient Active Problem List   Diagnosis Date Noted   Effusion of left knee joint 08/17/2021   ADD (attention deficit disorder) 11/13/2015   Connective tissue disease overlap syndrome (HCC) 11/13/2015   Menopausal and perimenopausal disorder 11/13/2015   Acne erythematosa 11/13/2015   Disordered sleep 11/13/2015   History of operative procedure on hip 07/03/2015   Left knee pain 05/20/2014   Right foot pain 05/14/2014   Bilateral shoulder pain 05/14/2014   Hypothyroidism 03/08/2014   Degenerative arthritis of hip 03/07/2012    PCP: Maple Hudson MD   REFERRING PROVIDER: Dr Nancy Fetter  REFERRING DIAG: [phone] (ICD-10-CM) - Presence of left artificial hip joint   THERAPY DIAG:  Chronic bilateral low back pain without sciatica  Pain in right hip  Pain in left hip  Other abnormalities of gait and mobility  Rationale for Evaluation and Treatment: Rehabilitation  ONSET DATE:   SUBJECTIVE: 4/24 was the original revision surgery. long history of hip dislocations   SUBJECTIVE STATEMENT: "I feel like I'm doing better, standing up straighter."  PERTINENT HISTORY: Right shoulder pain; right knee pain; Right Knee OA ; RA; connection tissue disease overlap syndrome. Charcot Foot   PAIN:   Are you having pain? Yes: NPRS scale: 3-4/10 Pain location: Left hip  Pain description: ache/sharp Aggravating factors: walking without support   Relieving factors: not doing things like that     PRECAUTIONS: Other: hip : do not cross baseline   WEIGHT BEARING RESTRICTIONS: No  FALLS:  Has patient fallen in last 6 months? No  LIVING ENVIRONMENT: 2 steps in the house and 2 spots around the house in various areas  OCCUPATION:  Retired   PLOF: Independent with community mobility with device  PATIENT GOALS: to walk better   NEXT MD VISIT: 6 months   OBJECTIVE:   DIAGNOSTIC FINDINGS: Post op: Hip intact per patient   POSTURE: flexed posture left knee valgus   LOWER EXTREMITY ROM:  Active ROM Right eval Left eval  Hip flexion  WNL   Hip extension    Hip abduction    Hip adduction    Hip internal rotation    Hip external rotation    Knee flexion  Painful end range   Knee extension    Ankle dorsiflexion    Ankle plantarflexion    Ankle inversion    Ankle eversion     (Blank rows = not tested)  LOWER EXTREMITY MMT:  MMT Right eval Left eval left Rt/Lt (lb) 6/7 Right  8/9 Left 8/9 Rt/Lt 9/9  Hip flexion 13.9 10 11.6 16.6/25.8  17.3 22.6   Hip extension         Hip abduction 16.8 11.4 11 9.5 / 3-/5 28.2 17.4 Sidelying above knee 10.5 lb / 3-/5  Hip adduction         Hip internal rotation         Hip external rotation         Knee flexion         Knee extension 21.7 19 20.3 27.2/24.2 42.0 37.6   Ankle dorsiflexion         Ankle plantarflexion         Ankle inversion         Ankle eversion          (Blank rows = not tested)  ER/IR WFL Right 90 Shoulder flexion Left 120 shoulder flexion     FUNCTIONAL TESTS:  Will perform 5x sit to stand test, to be performed  Sit to stand: uses hands   5x sit to stand test 5x 10sec   GAIT: Signifcant left trendelenburgh gait   TODAY'S TREATMENT:                                                                                                                               DATE:   Treatment                            9/11: Pt seen for aquatic therapy today.  Treatment took place in water 3.5-4.75 ft in depth at the [facility] pool. Temp of water was 91.  Pt entered/exited the pool via stairs independently with bilat rail.  * floatation belt donned - walking forward with cues for heel strike;  added bilat yellow hand floats at side for more upright posture * back against wall, yellow noodle under Lt thigh moving into hip ext to neutral  * plank with 2 yellow noodle under abdomen with hands on bench in water, core activated with alternating straight leg hip flex, and hip abdct/addct  * with aqua jogger donned and yellow hand floats under water staggered stance weight shift forward/backward in 59ft 6" water * straddling yellow noodle and cycling for rest and recovery  Treatment                            9/9:  Reviewed aqatics ex Gait- heel strike to glut med activation - from perching on side of table, discussion regarding possible KAFO   Treatment                            8/26:  Reviewed 8/15 exercises Plie in turnout- tiny bend, Lt hip forward, squeeze gluts Supine alt clam- yellow tband with pillow bw knees Supine mini bridge- pillow bw knees + yellow tband; press left hip forward & left knee out  8/23 Ex bike 5 min   LAQ 2x15 5 lbs  Standing hip 3 way x15 attempted 3 pounds but moved down to 2 lbs mod cuing for technique  Reviewed exercises from last visit.  Reviewed HEP      PATIENT EDUCATION:  Education details:symptom management ; HEP  Person educated: Patient Education method: Explanation, Demonstration, Tactile cues, Verbal cues, Education comprehension: verbalized understanding, returned demonstration, verbal cues required, tactile cues required, and needs further education  HOME EXERCISE PROGRAM: Access Code: [email or web address] URL:  [email or web address]   ASSESSMENT:  CLINICAL IMPRESSION: Good response with use of floatation belt and yellow hand floats under water at side for upright lift to trunk. Pt reported fatigue and mild pain in Lt glute with gait and weight shifts with cues for heel strike/glut med activation. Added 3 exercises to HEP for pool.  Pt is progressing towards remaining goals.  Plan to touch base with pt about KAFO.    OBJECTIVE IMPAIRMENTS: Abnormal gait, decreased activity tolerance, decreased balance, decreased endurance, decreased knowledge of use of DME, decreased mobility, difficulty walking, decreased ROM, decreased strength, increased fascial restrictions, increased muscle spasms, postural dysfunction, and pain.   ACTIVITY LIMITATIONS: carrying, lifting, bending, standing, squatting, stairs, transfers, and locomotion level  PARTICIPATION LIMITATIONS: meal prep, cleaning, laundry, driving, shopping, community activity, and yard work  PERSONAL FACTORS: 3+ comorbidities: RA ; multiple hip dislocations; right knee OA; bilateral RTC tears   are also affecting patient's functional outcome.   REHAB POTENTIAL: Good  CLINICAL DECISION MAKING: Evolving/moderate complexity  EVALUATION COMPLEXITY: Moderate   GOALS: Goals reviewed with patient? Yes   SHORT TERM GOALS: Target date: 12/09/2022   Patient will be independent with basic HEP for hip strengthening with no pain or instability  Baseline: Goal status: Ongoing (Met with aquatics) 12/29/22  2.  Patient will increase gross bilateral hip strength by 5 pounds Baseline:  Goal status: partially met, unable to lift Rt Lt in abd against gravity  3.  Patient will ambulate 500 feet with upright standing posture and minimal pain with least restrictive assistive device Baseline:  Goal status: ongoing   LONG TERM GOALS: Target date: POC date    Patient will return to swimming with minimal pain Baseline:  Goal status:achieved  2.   Patient will ambulate community distances with least restrictive assistive device and good posture Baseline:  Goal status: progressing 8/10  3.  Patient will be independent with complete program for core stability, posture, and to maximize potential to increase gait distance Baseline:  Goal status better: 8/10   4.  Able to ambulate without valgus support through knees Baseline:  Goal status: not changed  8/10    PLAN:  PT FREQUENCY: 2x/week  PT DURATION: 8 weeks  PLANNED INTERVENTIONS: Therapeutic exercises, Therapeutic activity, Neuromuscular re-education, Balance training, Gait training, Patient/Family education, Self Care, Joint mobilization (of the shoulder), Stair training, Aquatic Therapy, Dry Needling, Cryotherapy, Moist heat, Taping, and Manual therapy  PLAN FOR NEXT SESSION: Aquatics: Work on gait stability, bilateral hip strengthening, core stability Land: Continue to work on standing exercises and stepping exercises.  Continue to develop home program.  Continue to load current exercises to the appropriate difficulty.  Continue to work on not crossing midline per MD recommendations.   Mayer Camel, PTA 04/13/23 1:29 PM [facility] [address] [place], [place], [address] Phone: [phone]   Fax:  [phone]

## 2023-04-14 ENCOUNTER — Encounter (HOSPITAL_BASED_OUTPATIENT_CLINIC_OR_DEPARTMENT_OTHER): Payer: PRIVATE HEALTH INSURANCE | Admitting: Physical Therapy

## 2023-04-14 ENCOUNTER — Encounter (HOSPITAL_BASED_OUTPATIENT_CLINIC_OR_DEPARTMENT_OTHER): Payer: Self-pay

## 2023-04-15 ENCOUNTER — Ambulatory Visit (HOSPITAL_BASED_OUTPATIENT_CLINIC_OR_DEPARTMENT_OTHER): Payer: Medicare Other | Admitting: Physical Therapy

## 2023-04-18 ENCOUNTER — Ambulatory Visit (HOSPITAL_BASED_OUTPATIENT_CLINIC_OR_DEPARTMENT_OTHER): Payer: Medicare Other | Admitting: Physical Therapy

## 2023-04-18 DIAGNOSIS — M25561 Pain in right knee: Secondary | ICD-10-CM | POA: Diagnosis not present

## 2023-04-18 DIAGNOSIS — Z471 Aftercare following joint replacement surgery: Secondary | ICD-10-CM | POA: Diagnosis not present

## 2023-04-18 DIAGNOSIS — G8929 Other chronic pain: Secondary | ICD-10-CM | POA: Diagnosis not present

## 2023-04-18 DIAGNOSIS — M25562 Pain in left knee: Secondary | ICD-10-CM | POA: Diagnosis not present

## 2023-04-18 DIAGNOSIS — Z96642 Presence of left artificial hip joint: Secondary | ICD-10-CM | POA: Diagnosis not present

## 2023-04-18 DIAGNOSIS — Z96641 Presence of right artificial hip joint: Secondary | ICD-10-CM | POA: Diagnosis not present

## 2023-04-20 ENCOUNTER — Ambulatory Visit (HOSPITAL_BASED_OUTPATIENT_CLINIC_OR_DEPARTMENT_OTHER): Payer: Medicare Other | Admitting: Physical Therapy

## 2023-04-20 DIAGNOSIS — G8929 Other chronic pain: Secondary | ICD-10-CM

## 2023-04-20 DIAGNOSIS — M25551 Pain in right hip: Secondary | ICD-10-CM | POA: Diagnosis not present

## 2023-04-20 DIAGNOSIS — M25511 Pain in right shoulder: Secondary | ICD-10-CM | POA: Diagnosis not present

## 2023-04-20 DIAGNOSIS — M25552 Pain in left hip: Secondary | ICD-10-CM | POA: Diagnosis not present

## 2023-04-20 DIAGNOSIS — R2689 Other abnormalities of gait and mobility: Secondary | ICD-10-CM

## 2023-04-20 DIAGNOSIS — M545 Low back pain, unspecified: Secondary | ICD-10-CM | POA: Diagnosis not present

## 2023-04-20 NOTE — Therapy (Signed)
OUTPATIENT PHYSICAL THERAPY LOWER EXTREMITY TREATMENT  Patient Name: Virginia Gordon MRN: [id number] DOB:30-Jul-1951, 72 y.o., female Today's Date: 04/21/2023  END OF SESSION:  PT End of Session - 04/20/23 1441     Visit Number 26    Number of Visits 36    Date for PT Re-Evaluation 05/06/23    PT Start Time 1430    PT Stop Time 1515    PT Time Calculation (min) 45 min    Activity Tolerance Patient tolerated treatment well    Behavior During Therapy WFL for tasks assessed/performed                   Past Medical History:  Diagnosis Date   Rheumatoid arthritis (HCC)    Past Surgical History:  Procedure Laterality Date   BREAST BIOPSY Left    pt not sure of date.   HIP SURGERY     SHOULDER SURGERY     Patient Active Problem List   Diagnosis Date Noted   Effusion of left knee joint 08/17/2021   ADD (attention deficit disorder) 11/13/2015   Connective tissue disease overlap syndrome (HCC) 11/13/2015   Menopausal and perimenopausal disorder 11/13/2015   Acne erythematosa 11/13/2015   Disordered sleep 11/13/2015   History of operative procedure on hip 07/03/2015   Left knee pain 05/20/2014   Right foot pain 05/14/2014   Bilateral shoulder pain 05/14/2014   Hypothyroidism 03/08/2014   Degenerative arthritis of hip 03/07/2012    PCP: Maple Hudson MD   REFERRING PROVIDER: Dr Nancy Fetter  REFERRING DIAG: [phone] (ICD-10-CM) - Presence of left artificial hip joint   THERAPY DIAG:  Chronic bilateral low back pain without sciatica  Pain in right hip  Pain in left hip  Other abnormalities of gait and mobility  Chronic right shoulder pain  Rationale for Evaluation and Treatment: Rehabilitation  ONSET DATE:   SUBJECTIVE: 4/24 was the original revision surgery. long history of hip dislocations. The hip has had slightly more pain  her back. She feels like the ablation is wearing off.  SUBJECTIVE STATEMENT: The patient has been to the MD. She reports nothing  new. He feels the hip is doing well.   PERTINENT HISTORY: Right shoulder pain; right knee pain; Right Knee OA ; RA; connection tissue disease overlap syndrome. Charcot Foot   PAIN:  Are you having pain? Yes: NPRS scale: 5/10 Pain location: Left hip  Pain description: ache/sharp Aggravating factors: walking without support   Relieving factors: not doing things like that     PRECAUTIONS: Other: hip : do not cross baseline   WEIGHT BEARING RESTRICTIONS: No  FALLS:  Has patient fallen in last 6 months? No  LIVING ENVIRONMENT: 2 steps in the house and 2 spots around the house in various areas  OCCUPATION:  Retired   PLOF: Independent with community mobility with device  PATIENT GOALS: to walk better   NEXT MD VISIT: 6 months   OBJECTIVE:   DIAGNOSTIC FINDINGS: Post op: Hip intact per patient   POSTURE: flexed posture left knee valgus   LOWER EXTREMITY ROM:  Active ROM Right eval Left eval  Hip flexion  WNL   Hip extension    Hip abduction    Hip adduction    Hip internal rotation    Hip external rotation    Knee flexion  Painful end range   Knee extension    Ankle dorsiflexion    Ankle plantarflexion    Ankle inversion    Ankle eversion     (  Blank rows = not tested)  LOWER EXTREMITY MMT:  MMT Right eval Left eval left Rt/Lt (lb) 6/7 Right  8/9 Left 8/9 Rt/Lt 9/9  Hip flexion 13.9 10 11.6 16.6/25.8 17.3 22.6   Hip extension         Hip abduction 16.8 11.4 11 9.5 / 3-/5 28.2 17.4 Sidelying above knee 10.5 lb / 3-/5  Hip adduction         Hip internal rotation         Hip external rotation         Knee flexion         Knee extension 21.7 19 20.3 27.2/24.2 42.0 37.6   Ankle dorsiflexion         Ankle plantarflexion         Ankle inversion         Ankle eversion          (Blank rows = not tested)  ER/IR WFL Right 90 Shoulder flexion Left 120 shoulder flexion     FUNCTIONAL TESTS:  Will perform 5x sit to stand test, to be performed  Sit  to stand: uses hands   5x sit to stand test 5x 10sec   GAIT: Signifcant left trendelenburgh gait   TODAY'S TREATMENT:                                                                                                                              DATE:      9/19  Ex bike 5 min L4 with yellow band   LAQ 5 lbs 3x15 bilateral   Standing side stepping: slow with cuing to tuck her bottom 5 laps  Standing weight shift forward x20   Standing forward step with single UE stability and therapist assist 3 laps. Decreased assist required with trials.   Talked with patient abot realistic rehab potential going forward.       Treatment                            9/11: Pt seen for aquatic therapy today.  Treatment took place in water 3.5-4.75 ft in depth at the [facility] pool. Temp of water was 91.  Pt entered/exited the pool via stairs independently with bilat rail.  * floatation belt donned - walking forward with cues for heel strike;  added bilat yellow hand floats at side for more upright posture * back against wall, yellow noodle under Lt thigh moving into hip ext to neutral  * plank with 2 yellow noodle under abdomen with hands on bench in water, core activated with alternating straight leg hip flex, and hip abdct/addct  * with aqua jogger donned and yellow hand floats under water staggered stance weight shift forward/backward in 71ft 6" water * straddling yellow noodle and cycling for rest and recovery  Treatment  9/9:  Reviewed aqatics ex Gait- heel strike to glut med activation - from perching on side of table, discussion regarding possible KAFO   Treatment                            8/26:  Reviewed 8/15 exercises Plie in turnout- tiny bend, Lt hip forward, squeeze gluts Supine alt clam- yellow tband with pillow bw knees Supine mini bridge- pillow bw knees + yellow tband; press left hip forward & left knee out    8/23 Ex bike 5 min    LAQ 2x15 5 lbs  Standing hip 3 way x15 attempted 3 pounds but moved down to 2 lbs mod cuing for technique  Reviewed exercises from last visit.  Reviewed HEP      PATIENT EDUCATION:  Education details:symptom management ; HEP  Person educated: Patient Education method: Explanation, Demonstration, Tactile cues, Verbal cues, Education comprehension: verbalized understanding, returned demonstration, verbal cues required, tactile cues required, and needs further education  HOME EXERCISE PROGRAM: Access Code: [email or web address] URL: [email or web address]   ASSESSMENT:  CLINICAL IMPRESSION: The patient tolerated treatment well. We discussed her plan with her. We advised her, it is unclear how much strength and stability we can get back, but we would like her to have a plan to at best maintain the mobility she does have. She tolerated treatment well. She required some cuing to tuck her bottom and go slow through her movements. We will continue with gross strengthening and strength and balance exercises.  OBJECTIVE IMPAIRMENTS: Abnormal gait, decreased activity tolerance, decreased balance, decreased endurance, decreased knowledge of use of DME, decreased mobility, difficulty walking, decreased ROM, decreased strength, increased fascial restrictions, increased muscle spasms, postural dysfunction, and pain.   ACTIVITY LIMITATIONS: carrying, lifting, bending, standing, squatting, stairs, transfers, and locomotion level  PARTICIPATION LIMITATIONS: meal prep, cleaning, laundry, driving, shopping, community activity, and yard work  PERSONAL FACTORS: 3+ comorbidities: RA ; multiple hip dislocations; right knee OA; bilateral RTC tears   are also affecting patient's functional outcome.   REHAB POTENTIAL: Good  CLINICAL DECISION MAKING: Evolving/moderate complexity  EVALUATION COMPLEXITY: Moderate   GOALS: Goals reviewed with patient? Yes   SHORT TERM GOALS: Target date: 12/09/2022    Patient will be independent with basic HEP for hip strengthening with no pain or instability  Baseline: Goal status: Ongoing (Met with aquatics) 12/29/22  2.  Patient will increase gross bilateral hip strength by 5 pounds Baseline:  Goal status: partially met, unable to lift Rt Lt in abd against gravity  3.  Patient will ambulate 500 feet with upright standing posture and minimal pain with least restrictive assistive device Baseline:  Goal status: ongoing   LONG TERM GOALS: Target date: POC date    Patient will return to swimming with minimal pain Baseline:  Goal status:achieved  2.  Patient will ambulate community distances with least restrictive assistive device and good posture Baseline:  Goal status: progressing 8/10  3.  Patient will be independent with complete program for core stability, posture, and to maximize potential to increase gait distance Baseline:  Goal status better: 8/10   4.  Able to ambulate without valgus support through knees Baseline:  Goal status: not changed  8/10    PLAN:  PT FREQUENCY: 2x/week  PT DURATION: 8 weeks  PLANNED INTERVENTIONS: Therapeutic exercises, Therapeutic activity, Neuromuscular re-education, Balance training, Gait training, Patient/Family education, Self Care, Joint mobilization (of the shoulder), Stair training, Aquatic Therapy, Dry Needling,  Cryotherapy, Moist heat, Taping, and Manual therapy  PLAN FOR NEXT SESSION: Aquatics: Work on gait stability, bilateral hip strengthening, core stability Land: Continue to work on standing exercises and stepping exercises.  Continue to develop home program.  Continue to load current exercises to the appropriate difficulty.  Continue to work on not crossing midline per MD recommendations.   Mayer Camel, PTA 04/21/23 10:42 AM [facility] [address] [place], [place], [address] Phone: [phone]   Fax:   [phone]

## 2023-04-21 ENCOUNTER — Encounter (HOSPITAL_BASED_OUTPATIENT_CLINIC_OR_DEPARTMENT_OTHER): Payer: Self-pay | Admitting: Physical Therapy

## 2023-04-21 DIAGNOSIS — H01002 Unspecified blepharitis right lower eyelid: Secondary | ICD-10-CM | POA: Diagnosis not present

## 2023-04-21 DIAGNOSIS — H01005 Unspecified blepharitis left lower eyelid: Secondary | ICD-10-CM | POA: Diagnosis not present

## 2023-04-21 DIAGNOSIS — H2511 Age-related nuclear cataract, right eye: Secondary | ICD-10-CM | POA: Diagnosis not present

## 2023-04-29 ENCOUNTER — Encounter (HOSPITAL_BASED_OUTPATIENT_CLINIC_OR_DEPARTMENT_OTHER): Payer: Medicare Other | Admitting: Physical Therapy

## 2023-05-02 DIAGNOSIS — Z6822 Body mass index (BMI) 22.0-22.9, adult: Secondary | ICD-10-CM | POA: Diagnosis not present

## 2023-05-02 DIAGNOSIS — F3342 Major depressive disorder, recurrent, in full remission: Secondary | ICD-10-CM | POA: Diagnosis not present

## 2023-05-02 DIAGNOSIS — R29898 Other symptoms and signs involving the musculoskeletal system: Secondary | ICD-10-CM | POA: Diagnosis not present

## 2023-05-02 DIAGNOSIS — G894 Chronic pain syndrome: Secondary | ICD-10-CM | POA: Diagnosis not present

## 2023-05-02 DIAGNOSIS — M810 Age-related osteoporosis without current pathological fracture: Secondary | ICD-10-CM | POA: Diagnosis not present

## 2023-05-02 DIAGNOSIS — E039 Hypothyroidism, unspecified: Secondary | ICD-10-CM | POA: Diagnosis not present

## 2023-05-02 DIAGNOSIS — Z23 Encounter for immunization: Secondary | ICD-10-CM | POA: Diagnosis not present

## 2023-05-02 DIAGNOSIS — G479 Sleep disorder, unspecified: Secondary | ICD-10-CM | POA: Diagnosis not present

## 2023-05-02 DIAGNOSIS — Z79899 Other long term (current) drug therapy: Secondary | ICD-10-CM | POA: Diagnosis not present

## 2023-05-02 DIAGNOSIS — M2550 Pain in unspecified joint: Secondary | ICD-10-CM | POA: Diagnosis not present

## 2023-05-02 DIAGNOSIS — L4050 Arthropathic psoriasis, unspecified: Secondary | ICD-10-CM | POA: Diagnosis not present

## 2023-05-02 DIAGNOSIS — I10 Essential (primary) hypertension: Secondary | ICD-10-CM | POA: Diagnosis not present

## 2023-05-06 ENCOUNTER — Encounter (HOSPITAL_BASED_OUTPATIENT_CLINIC_OR_DEPARTMENT_OTHER): Payer: Medicare Other | Admitting: Physical Therapy

## 2023-05-13 ENCOUNTER — Ambulatory Visit (HOSPITAL_BASED_OUTPATIENT_CLINIC_OR_DEPARTMENT_OTHER): Payer: Medicare Other | Admitting: Physical Therapy

## 2023-05-20 ENCOUNTER — Ambulatory Visit (HOSPITAL_BASED_OUTPATIENT_CLINIC_OR_DEPARTMENT_OTHER): Payer: Medicare Other | Attending: Orthopedic Surgery | Admitting: Physical Therapy

## 2023-05-20 ENCOUNTER — Encounter (HOSPITAL_BASED_OUTPATIENT_CLINIC_OR_DEPARTMENT_OTHER): Payer: Self-pay | Admitting: Physical Therapy

## 2023-05-20 DIAGNOSIS — M545 Low back pain, unspecified: Secondary | ICD-10-CM | POA: Diagnosis not present

## 2023-05-20 DIAGNOSIS — G8929 Other chronic pain: Secondary | ICD-10-CM | POA: Insufficient documentation

## 2023-05-20 DIAGNOSIS — M25551 Pain in right hip: Secondary | ICD-10-CM | POA: Insufficient documentation

## 2023-05-20 NOTE — Therapy (Signed)
OUTPATIENT PHYSICAL THERAPY LOWER EXTREMITY TREATMENT  Patient Name: Virginia Gordon MRN: [id number] DOB:14-Aug-1950, 72 y.o., female Today's Date: 05/21/2023  END OF SESSION:   Visit #27 Time in 1152 Time out 1230 Total time 38 min Date for PT Re-evaluation 05/20/23         Past Medical History:  Diagnosis Date   Rheumatoid arthritis (HCC)    Past Surgical History:  Procedure Laterality Date   BREAST BIOPSY Left    pt not sure of date.   HIP SURGERY     SHOULDER SURGERY     Patient Active Problem List   Diagnosis Date Noted   Effusion of left knee joint 08/17/2021   ADD (attention deficit disorder) 11/13/2015   Connective tissue disease overlap syndrome (HCC) 11/13/2015   Menopausal and perimenopausal disorder 11/13/2015   Acne erythematosa 11/13/2015   Disordered sleep 11/13/2015   History of operative procedure on hip 07/03/2015   Left knee pain 05/20/2014   Right foot pain 05/14/2014   Bilateral shoulder pain 05/14/2014   Hypothyroidism 03/08/2014   Degenerative arthritis of hip 03/07/2012    PCP: Maple Hudson MD   REFERRING PROVIDER: Dr Nancy Fetter  REFERRING DIAG: [phone] (ICD-10-CM) - Presence of left artificial hip joint   THERAPY DIAG:  Chronic bilateral low back pain without sciatica  Pain in right hip  Rationale for Evaluation and Treatment: Rehabilitation  ONSET DATE:   SUBJECTIVE: 4/24 was the original revision surgery. long history of hip dislocations. The hip has had slightly more pain  her back. She feels like the ablation is wearing off.  SUBJECTIVE STATEMENT: 2 partial dislocations since last appt.  Both times I was getting up out of bed.    PERTINENT HISTORY: Right shoulder pain; right knee pain; Right Knee OA ; RA; connection tissue disease overlap syndrome. Charcot Foot   PAIN:  Are you having pain? Yes: NPRS scale: yes/10 Pain location: Left hip, sit bone.  Pain description: ache/sharp Aggravating factors: walking without  support   Relieving factors: not doing things like that     PRECAUTIONS: Other: hip : do not cross baseline   WEIGHT BEARING RESTRICTIONS: No  FALLS:  Has patient fallen in last 6 months? No  LIVING ENVIRONMENT: 2 steps in the house and 2 spots around the house in various areas  OCCUPATION:  Retired   PLOF: Independent with community mobility with device  PATIENT GOALS: to walk better   NEXT MD VISIT: 6 months   OBJECTIVE:   DIAGNOSTIC FINDINGS: Post op: Hip intact per patient   POSTURE: flexed posture left knee valgus   LOWER EXTREMITY ROM:  Active ROM Right eval Left eval  Hip flexion  WNL   Hip extension    Hip abduction    Hip adduction    Hip internal rotation    Hip external rotation    Knee flexion  Painful end range   Knee extension    Ankle dorsiflexion    Ankle plantarflexion    Ankle inversion    Ankle eversion     (Blank rows = not tested)  LOWER EXTREMITY MMT:  MMT Right eval Left eval left Rt/Lt (lb) 6/7 Right  8/9 Left 8/9 Rt/Lt 9/9  Hip flexion 13.9 10 11.6 16.6/25.8 17.3 22.6   Hip extension         Hip abduction 16.8 11.4 11 9.5 / 3-/5 28.2 17.4 Sidelying above knee 10.5 lb / 3-/5  Hip adduction         Hip  internal rotation         Hip external rotation         Knee flexion         Knee extension 21.7 19 20.3 27.2/24.2 42.0 37.6   Ankle dorsiflexion         Ankle plantarflexion         Ankle inversion         Ankle eversion          (Blank rows = not tested)  ER/IR WFL Right 90 Shoulder flexion Left 120 shoulder flexion     FUNCTIONAL TESTS:  Will perform 5x sit to stand test, to be performed  Sit to stand: uses hands   5x sit to stand test 5x 10sec   GAIT: Signifcant left trendelenburgh gait   TODAY'S TREATMENT:                                                                                                                              DATE:    Treatment                            10/18:  See  plan   9/19  Ex bike 5 min L4 with yellow band   LAQ 5 lbs 3x15 bilateral   Standing side stepping: slow with cuing to tuck her bottom 5 laps  Standing weight shift forward x20   Standing forward step with single UE stability and therapist assist 3 laps. Decreased assist required with trials.   Talked with patient abot realistic rehab potential going forward.       Treatment                            9/11: Pt seen for aquatic therapy today.  Treatment took place in water 3.5-4.75 ft in depth at the [facility] pool. Temp of water was 91.  Pt entered/exited the pool via stairs independently with bilat rail.  * floatation belt donned - walking forward with cues for heel strike;  added bilat yellow hand floats at side for more upright posture * back against wall, yellow noodle under Lt thigh moving into hip ext to neutral  * plank with 2 yellow noodle under abdomen with hands on bench in water, core activated with alternating straight leg hip flex, and hip abdct/addct  * with aqua jogger donned and yellow hand floats under water staggered stance weight shift forward/backward in 50ft 6" water * straddling yellow noodle and cycling for rest and recovery     PATIENT EDUCATION:  Education details:symptom management ; HEP  Person educated: Patient Education method: Explanation, Demonstration, Tactile cues, Verbal cues, Education comprehension: verbalized understanding, returned demonstration, verbal cues required, tactile cues required, and needs further education  HOME EXERCISE PROGRAM: Access Code: [email or web address] URL: [email or web address]   ASSESSMENT:  CLINICAL IMPRESSION: Pt has experienced 2  partial dislocations since last visit indicating decreased stability of hip. She is doing very well with her exercises and is prepared for d/c to independent program at this time. She will continue with the pool. Is hesitant to revisit MD which we discussed  risk/benefit at length. We also returned to the conversation of considering HKAFO for extended support.  Encouraged her to reach out with any further questions and plan to return to PT in a few months to recheck HEP and make changes PRN.   OBJECTIVE IMPAIRMENTS: Abnormal gait, decreased activity tolerance, decreased balance, decreased endurance, decreased knowledge of use of DME, decreased mobility, difficulty walking, decreased ROM, decreased strength, increased fascial restrictions, increased muscle spasms, postural dysfunction, and pain.   ACTIVITY LIMITATIONS: carrying, lifting, bending, standing, squatting, stairs, transfers, and locomotion level  PARTICIPATION LIMITATIONS: meal prep, cleaning, laundry, driving, shopping, community activity, and yard work  PERSONAL FACTORS: 3+ comorbidities: RA ; multiple hip dislocations; right knee OA; bilateral RTC tears   are also affecting patient's functional outcome.   REHAB POTENTIAL: Good  CLINICAL DECISION MAKING: Evolving/moderate complexity  EVALUATION COMPLEXITY: Moderate   GOALS: Goals reviewed with patient? Yes   SHORT TERM GOALS: Target date: 12/09/2022   Patient will be independent with basic HEP for hip strengthening with no pain or instability  Baseline: Goal status: Ongoing (Met with aquatics) 12/29/22  2.  Patient will increase gross bilateral hip strength by 5 pounds Baseline:  Goal status: partially met, unable to lift Rt Lt in abd against gravity  3.  Patient will ambulate 500 feet with upright standing posture and minimal pain with least restrictive assistive device Baseline:  Goal status: ongoing   LONG TERM GOALS: Target date: POC date    Patient will return to swimming with minimal pain Baseline:  Goal status:achieved  2.  Patient will ambulate community distances with least restrictive assistive device and good posture Baseline:  Goal status: partially met  3.  Patient will be independent with complete  program for core stability, posture, and to maximize potential to increase gait distance Baseline:  Goal status: achieved   4.  Able to ambulate without valgus support through knees Baseline:  Goal status: not met    PLAN:  PT FREQUENCY: 2x/week  PT DURATION: 8 weeks  PLANNED INTERVENTIONS: Therapeutic exercises, Therapeutic activity, Neuromuscular re-education, Balance training, Gait training, Patient/Family education, Self Care, Joint mobilization (of the shoulder), Stair training, Aquatic Therapy, Dry Needling, Cryotherapy, Moist heat, Taping, and Manual therapy  PLAN FOR NEXT SESSION: Aquatics: Work on gait stability, bilateral hip strengthening, core stability Land: Continue to work on standing exercises and stepping exercises.  Continue to develop home program.  Continue to load current exercises to the appropriate difficulty.  Continue to work on not crossing midline per MD recommendations.   Gerard Cantara C. Makel Mcmann PT, DPT 05/21/23 9:24 PM  [facility] [address] [place], [place], [address] Phone: [phone]   Fax:  [phone]

## 2023-05-27 ENCOUNTER — Ambulatory Visit (HOSPITAL_BASED_OUTPATIENT_CLINIC_OR_DEPARTMENT_OTHER): Payer: Medicare Other | Admitting: Physical Therapy

## 2023-06-03 ENCOUNTER — Ambulatory Visit (HOSPITAL_BASED_OUTPATIENT_CLINIC_OR_DEPARTMENT_OTHER): Payer: Medicare Other | Admitting: Physical Therapy

## 2023-07-11 DIAGNOSIS — M170 Bilateral primary osteoarthritis of knee: Secondary | ICD-10-CM | POA: Diagnosis not present

## 2023-07-11 DIAGNOSIS — M21061 Valgus deformity, not elsewhere classified, right knee: Secondary | ICD-10-CM | POA: Diagnosis not present

## 2023-07-11 DIAGNOSIS — M1711 Unilateral primary osteoarthritis, right knee: Secondary | ICD-10-CM | POA: Diagnosis not present

## 2023-07-11 DIAGNOSIS — M21062 Valgus deformity, not elsewhere classified, left knee: Secondary | ICD-10-CM | POA: Diagnosis not present

## 2023-07-11 DIAGNOSIS — Z96643 Presence of artificial hip joint, bilateral: Secondary | ICD-10-CM | POA: Diagnosis not present

## 2023-07-11 DIAGNOSIS — Z96642 Presence of left artificial hip joint: Secondary | ICD-10-CM | POA: Diagnosis not present

## 2023-07-11 DIAGNOSIS — M25461 Effusion, right knee: Secondary | ICD-10-CM | POA: Diagnosis not present

## 2023-08-08 DIAGNOSIS — M25561 Pain in right knee: Secondary | ICD-10-CM | POA: Diagnosis not present

## 2023-08-08 DIAGNOSIS — Z6823 Body mass index (BMI) 23.0-23.9, adult: Secondary | ICD-10-CM | POA: Diagnosis not present

## 2023-08-08 DIAGNOSIS — G894 Chronic pain syndrome: Secondary | ICD-10-CM | POA: Diagnosis not present

## 2023-08-08 DIAGNOSIS — Z23 Encounter for immunization: Secondary | ICD-10-CM | POA: Diagnosis not present

## 2023-08-11 DIAGNOSIS — Z961 Presence of intraocular lens: Secondary | ICD-10-CM | POA: Diagnosis not present

## 2023-08-11 DIAGNOSIS — H25811 Combined forms of age-related cataract, right eye: Secondary | ICD-10-CM | POA: Diagnosis not present

## 2023-08-11 DIAGNOSIS — H2511 Age-related nuclear cataract, right eye: Secondary | ICD-10-CM | POA: Diagnosis not present

## 2023-09-12 DIAGNOSIS — M170 Bilateral primary osteoarthritis of knee: Secondary | ICD-10-CM | POA: Diagnosis not present

## 2023-09-27 DIAGNOSIS — M170 Bilateral primary osteoarthritis of knee: Secondary | ICD-10-CM | POA: Diagnosis not present

## 2023-09-27 DIAGNOSIS — Z01818 Encounter for other preprocedural examination: Secondary | ICD-10-CM | POA: Diagnosis not present

## 2023-10-11 DIAGNOSIS — M069 Rheumatoid arthritis, unspecified: Secondary | ICD-10-CM | POA: Diagnosis not present

## 2023-10-11 DIAGNOSIS — M25761 Osteophyte, right knee: Secondary | ICD-10-CM | POA: Diagnosis not present

## 2023-10-11 DIAGNOSIS — Z79899 Other long term (current) drug therapy: Secondary | ICD-10-CM | POA: Diagnosis not present

## 2023-10-11 DIAGNOSIS — M7631 Iliotibial band syndrome, right leg: Secondary | ICD-10-CM | POA: Diagnosis not present

## 2023-10-11 DIAGNOSIS — K219 Gastro-esophageal reflux disease without esophagitis: Secondary | ICD-10-CM | POA: Diagnosis not present

## 2023-10-11 DIAGNOSIS — M1711 Unilateral primary osteoarthritis, right knee: Secondary | ICD-10-CM | POA: Diagnosis not present

## 2023-10-11 DIAGNOSIS — M170 Bilateral primary osteoarthritis of knee: Secondary | ICD-10-CM | POA: Diagnosis not present

## 2023-10-11 DIAGNOSIS — Z79890 Hormone replacement therapy (postmenopausal): Secondary | ICD-10-CM | POA: Diagnosis not present

## 2023-10-11 DIAGNOSIS — G8918 Other acute postprocedural pain: Secondary | ICD-10-CM | POA: Diagnosis not present

## 2023-10-11 DIAGNOSIS — Z791 Long term (current) use of non-steroidal anti-inflammatories (NSAID): Secondary | ICD-10-CM | POA: Diagnosis not present

## 2023-10-11 DIAGNOSIS — E039 Hypothyroidism, unspecified: Secondary | ICD-10-CM | POA: Diagnosis not present

## 2023-10-11 DIAGNOSIS — I10 Essential (primary) hypertension: Secondary | ICD-10-CM | POA: Diagnosis not present

## 2023-10-11 DIAGNOSIS — R338 Other retention of urine: Secondary | ICD-10-CM | POA: Diagnosis not present

## 2023-10-12 DIAGNOSIS — E039 Hypothyroidism, unspecified: Secondary | ICD-10-CM | POA: Diagnosis not present

## 2023-10-12 DIAGNOSIS — K219 Gastro-esophageal reflux disease without esophagitis: Secondary | ICD-10-CM | POA: Diagnosis not present

## 2023-10-12 DIAGNOSIS — I10 Essential (primary) hypertension: Secondary | ICD-10-CM | POA: Diagnosis not present

## 2023-10-12 DIAGNOSIS — R338 Other retention of urine: Secondary | ICD-10-CM | POA: Diagnosis not present

## 2023-10-12 DIAGNOSIS — G8918 Other acute postprocedural pain: Secondary | ICD-10-CM | POA: Diagnosis not present

## 2023-10-12 DIAGNOSIS — M170 Bilateral primary osteoarthritis of knee: Secondary | ICD-10-CM | POA: Diagnosis not present

## 2023-10-13 DIAGNOSIS — R338 Other retention of urine: Secondary | ICD-10-CM | POA: Diagnosis not present

## 2023-10-13 DIAGNOSIS — K219 Gastro-esophageal reflux disease without esophagitis: Secondary | ICD-10-CM | POA: Diagnosis not present

## 2023-10-13 DIAGNOSIS — M170 Bilateral primary osteoarthritis of knee: Secondary | ICD-10-CM | POA: Diagnosis not present

## 2023-10-13 DIAGNOSIS — E039 Hypothyroidism, unspecified: Secondary | ICD-10-CM | POA: Diagnosis not present

## 2023-10-13 DIAGNOSIS — I10 Essential (primary) hypertension: Secondary | ICD-10-CM | POA: Diagnosis not present

## 2023-10-13 DIAGNOSIS — G8918 Other acute postprocedural pain: Secondary | ICD-10-CM | POA: Diagnosis not present

## 2023-10-27 DIAGNOSIS — Z96651 Presence of right artificial knee joint: Secondary | ICD-10-CM | POA: Diagnosis not present

## 2023-10-27 DIAGNOSIS — M25461 Effusion, right knee: Secondary | ICD-10-CM | POA: Diagnosis not present

## 2023-11-03 DIAGNOSIS — I10 Essential (primary) hypertension: Secondary | ICD-10-CM | POA: Diagnosis not present

## 2023-11-03 DIAGNOSIS — Z79899 Other long term (current) drug therapy: Secondary | ICD-10-CM | POA: Diagnosis not present

## 2023-11-03 DIAGNOSIS — M810 Age-related osteoporosis without current pathological fracture: Secondary | ICD-10-CM | POA: Diagnosis not present

## 2023-11-09 ENCOUNTER — Other Ambulatory Visit: Payer: Self-pay | Admitting: Family Medicine

## 2023-11-09 DIAGNOSIS — E039 Hypothyroidism, unspecified: Secondary | ICD-10-CM | POA: Diagnosis not present

## 2023-11-09 DIAGNOSIS — Z79899 Other long term (current) drug therapy: Secondary | ICD-10-CM | POA: Diagnosis not present

## 2023-11-09 DIAGNOSIS — I10 Essential (primary) hypertension: Secondary | ICD-10-CM | POA: Diagnosis not present

## 2023-11-09 DIAGNOSIS — M810 Age-related osteoporosis without current pathological fracture: Secondary | ICD-10-CM | POA: Diagnosis not present

## 2023-11-09 DIAGNOSIS — Z96651 Presence of right artificial knee joint: Secondary | ICD-10-CM | POA: Diagnosis not present

## 2023-11-09 DIAGNOSIS — G479 Sleep disorder, unspecified: Secondary | ICD-10-CM | POA: Diagnosis not present

## 2023-11-09 DIAGNOSIS — Z8781 Personal history of (healed) traumatic fracture: Secondary | ICD-10-CM | POA: Diagnosis not present

## 2023-11-09 DIAGNOSIS — Z6822 Body mass index (BMI) 22.0-22.9, adult: Secondary | ICD-10-CM | POA: Diagnosis not present

## 2023-11-09 DIAGNOSIS — G894 Chronic pain syndrome: Secondary | ICD-10-CM | POA: Diagnosis not present

## 2023-11-09 DIAGNOSIS — M2550 Pain in unspecified joint: Secondary | ICD-10-CM | POA: Diagnosis not present

## 2023-11-09 DIAGNOSIS — Z780 Asymptomatic menopausal state: Secondary | ICD-10-CM | POA: Diagnosis not present

## 2023-11-09 DIAGNOSIS — Z1231 Encounter for screening mammogram for malignant neoplasm of breast: Secondary | ICD-10-CM

## 2023-11-09 DIAGNOSIS — R1013 Epigastric pain: Secondary | ICD-10-CM | POA: Diagnosis not present

## 2023-11-10 ENCOUNTER — Other Ambulatory Visit: Payer: Self-pay | Admitting: Family Medicine

## 2023-11-10 DIAGNOSIS — M810 Age-related osteoporosis without current pathological fracture: Secondary | ICD-10-CM

## 2023-11-15 ENCOUNTER — Ambulatory Visit
Admission: RE | Admit: 2023-11-15 | Discharge: 2023-11-15 | Disposition: A | Discharge status: Home / Self Care | Source: Ambulatory Visit | Attending: Family Medicine | Admitting: Family Medicine

## 2023-11-15 DIAGNOSIS — Z1231 Encounter for screening mammogram for malignant neoplasm of breast: Secondary | ICD-10-CM

## 2023-12-27 DIAGNOSIS — M47816 Spondylosis without myelopathy or radiculopathy, lumbar region: Secondary | ICD-10-CM | POA: Diagnosis not present

## 2024-01-17 DIAGNOSIS — Z23 Encounter for immunization: Secondary | ICD-10-CM | POA: Diagnosis not present

## 2024-02-21 ENCOUNTER — Other Ambulatory Visit (HOSPITAL_BASED_OUTPATIENT_CLINIC_OR_DEPARTMENT_OTHER): Payer: Self-pay | Admitting: Family Medicine

## 2024-02-21 DIAGNOSIS — Z8781 Personal history of (healed) traumatic fracture: Secondary | ICD-10-CM

## 2024-02-21 DIAGNOSIS — M810 Age-related osteoporosis without current pathological fracture: Secondary | ICD-10-CM

## 2024-02-24 DIAGNOSIS — M47816 Spondylosis without myelopathy or radiculopathy, lumbar region: Secondary | ICD-10-CM | POA: Diagnosis not present

## 2024-03-23 ENCOUNTER — Other Ambulatory Visit (HOSPITAL_BASED_OUTPATIENT_CLINIC_OR_DEPARTMENT_OTHER): Admitting: Radiology

## 2024-04-20 DIAGNOSIS — Z23 Encounter for immunization: Secondary | ICD-10-CM | POA: Diagnosis not present

## 2024-04-26 DIAGNOSIS — M47816 Spondylosis without myelopathy or radiculopathy, lumbar region: Secondary | ICD-10-CM | POA: Diagnosis not present

## 2024-05-02 DIAGNOSIS — Z87891 Personal history of nicotine dependence: Secondary | ICD-10-CM | POA: Diagnosis not present

## 2024-05-02 DIAGNOSIS — F909 Attention-deficit hyperactivity disorder, unspecified type: Secondary | ICD-10-CM | POA: Diagnosis not present

## 2024-05-02 DIAGNOSIS — E039 Hypothyroidism, unspecified: Secondary | ICD-10-CM | POA: Diagnosis not present

## 2024-05-02 DIAGNOSIS — T8452XA Infection and inflammatory reaction due to internal left hip prosthesis, initial encounter: Secondary | ICD-10-CM | POA: Diagnosis not present

## 2024-05-02 DIAGNOSIS — Z96641 Presence of right artificial hip joint: Secondary | ICD-10-CM | POA: Diagnosis not present

## 2024-05-02 DIAGNOSIS — Z01818 Encounter for other preprocedural examination: Secondary | ICD-10-CM | POA: Diagnosis not present

## 2024-05-02 DIAGNOSIS — M069 Rheumatoid arthritis, unspecified: Secondary | ICD-10-CM | POA: Diagnosis not present

## 2024-05-02 DIAGNOSIS — Z96651 Presence of right artificial knee joint: Secondary | ICD-10-CM | POA: Diagnosis not present

## 2024-05-02 DIAGNOSIS — Z9189 Other specified personal risk factors, not elsewhere classified: Secondary | ICD-10-CM | POA: Diagnosis not present

## 2024-05-02 DIAGNOSIS — K219 Gastro-esophageal reflux disease without esophagitis: Secondary | ICD-10-CM | POA: Diagnosis not present

## 2024-05-02 DIAGNOSIS — I10 Essential (primary) hypertension: Secondary | ICD-10-CM | POA: Diagnosis not present

## 2024-05-02 DIAGNOSIS — M170 Bilateral primary osteoarthritis of knee: Secondary | ICD-10-CM | POA: Diagnosis not present

## 2024-05-02 DIAGNOSIS — Z96643 Presence of artificial hip joint, bilateral: Secondary | ICD-10-CM | POA: Diagnosis not present

## 2024-05-02 DIAGNOSIS — Z01810 Encounter for preprocedural cardiovascular examination: Secondary | ICD-10-CM | POA: Diagnosis not present

## 2024-05-02 DIAGNOSIS — M351 Other overlap syndromes: Secondary | ICD-10-CM | POA: Diagnosis not present

## 2024-05-07 DIAGNOSIS — Z79899 Other long term (current) drug therapy: Secondary | ICD-10-CM | POA: Diagnosis not present

## 2024-05-07 DIAGNOSIS — Z6823 Body mass index (BMI) 23.0-23.9, adult: Secondary | ICD-10-CM | POA: Diagnosis not present

## 2024-05-07 DIAGNOSIS — R1013 Epigastric pain: Secondary | ICD-10-CM | POA: Diagnosis not present

## 2024-05-07 DIAGNOSIS — Z780 Asymptomatic menopausal state: Secondary | ICD-10-CM | POA: Diagnosis not present

## 2024-05-07 DIAGNOSIS — G894 Chronic pain syndrome: Secondary | ICD-10-CM | POA: Diagnosis not present

## 2024-05-07 DIAGNOSIS — G479 Sleep disorder, unspecified: Secondary | ICD-10-CM | POA: Diagnosis not present

## 2024-05-07 DIAGNOSIS — I10 Essential (primary) hypertension: Secondary | ICD-10-CM | POA: Diagnosis not present

## 2024-05-07 DIAGNOSIS — E039 Hypothyroidism, unspecified: Secondary | ICD-10-CM | POA: Diagnosis not present

## 2024-05-07 DIAGNOSIS — M810 Age-related osteoporosis without current pathological fracture: Secondary | ICD-10-CM | POA: Diagnosis not present

## 2024-05-07 DIAGNOSIS — M2550 Pain in unspecified joint: Secondary | ICD-10-CM | POA: Diagnosis not present

## 2024-05-15 DIAGNOSIS — Z96643 Presence of artificial hip joint, bilateral: Secondary | ICD-10-CM | POA: Diagnosis not present

## 2024-05-15 DIAGNOSIS — Z87891 Personal history of nicotine dependence: Secondary | ICD-10-CM | POA: Diagnosis not present

## 2024-05-15 DIAGNOSIS — Z90710 Acquired absence of both cervix and uterus: Secondary | ICD-10-CM | POA: Diagnosis not present

## 2024-05-15 DIAGNOSIS — M0600 Rheumatoid arthritis without rheumatoid factor, unspecified site: Secondary | ICD-10-CM | POA: Diagnosis not present

## 2024-05-15 DIAGNOSIS — E871 Hypo-osmolality and hyponatremia: Secondary | ICD-10-CM | POA: Diagnosis not present

## 2024-05-15 DIAGNOSIS — M7632 Iliotibial band syndrome, left leg: Secondary | ICD-10-CM | POA: Diagnosis not present

## 2024-05-15 DIAGNOSIS — M14671 Charcot's joint, right ankle and foot: Secondary | ICD-10-CM | POA: Diagnosis not present

## 2024-05-15 DIAGNOSIS — I10 Essential (primary) hypertension: Secondary | ICD-10-CM | POA: Diagnosis not present

## 2024-05-15 DIAGNOSIS — Z96651 Presence of right artificial knee joint: Secondary | ICD-10-CM | POA: Diagnosis not present

## 2024-05-15 DIAGNOSIS — M1712 Unilateral primary osteoarthritis, left knee: Secondary | ICD-10-CM | POA: Diagnosis not present

## 2024-05-15 DIAGNOSIS — Z79891 Long term (current) use of opiate analgesic: Secondary | ICD-10-CM | POA: Diagnosis not present

## 2024-05-15 DIAGNOSIS — M25762 Osteophyte, left knee: Secondary | ICD-10-CM | POA: Diagnosis not present

## 2024-05-15 DIAGNOSIS — E039 Hypothyroidism, unspecified: Secondary | ICD-10-CM | POA: Diagnosis not present

## 2024-05-29 DIAGNOSIS — Z96652 Presence of left artificial knee joint: Secondary | ICD-10-CM | POA: Diagnosis not present

## 2024-05-29 DIAGNOSIS — M25462 Effusion, left knee: Secondary | ICD-10-CM | POA: Diagnosis not present

## 2024-05-29 DIAGNOSIS — Z471 Aftercare following joint replacement surgery: Secondary | ICD-10-CM | POA: Diagnosis not present

## 2024-06-06 DIAGNOSIS — E871 Hypo-osmolality and hyponatremia: Secondary | ICD-10-CM | POA: Diagnosis not present

## 2024-06-06 DIAGNOSIS — E876 Hypokalemia: Secondary | ICD-10-CM | POA: Diagnosis not present

## 2024-06-06 DIAGNOSIS — Z6823 Body mass index (BMI) 23.0-23.9, adult: Secondary | ICD-10-CM | POA: Diagnosis not present

## 2024-06-06 DIAGNOSIS — Z96652 Presence of left artificial knee joint: Secondary | ICD-10-CM | POA: Diagnosis not present

## 2024-06-12 DIAGNOSIS — T84021D Dislocation of internal left hip prosthesis, subsequent encounter: Secondary | ICD-10-CM | POA: Diagnosis not present

## 2024-06-12 DIAGNOSIS — R936 Abnormal findings on diagnostic imaging of limbs: Secondary | ICD-10-CM | POA: Diagnosis not present

## 2024-06-12 DIAGNOSIS — Z7982 Long term (current) use of aspirin: Secondary | ICD-10-CM | POA: Diagnosis not present

## 2024-06-12 DIAGNOSIS — M069 Rheumatoid arthritis, unspecified: Secondary | ICD-10-CM | POA: Diagnosis present

## 2024-06-12 DIAGNOSIS — M1909 Primary osteoarthritis, other specified site: Secondary | ICD-10-CM | POA: Diagnosis not present

## 2024-06-12 DIAGNOSIS — E039 Hypothyroidism, unspecified: Secondary | ICD-10-CM | POA: Diagnosis present

## 2024-06-12 DIAGNOSIS — Z96642 Presence of left artificial hip joint: Secondary | ICD-10-CM | POA: Diagnosis not present

## 2024-06-12 DIAGNOSIS — Z87891 Personal history of nicotine dependence: Secondary | ICD-10-CM | POA: Diagnosis not present

## 2024-06-12 DIAGNOSIS — I10 Essential (primary) hypertension: Secondary | ICD-10-CM | POA: Diagnosis present

## 2024-06-12 DIAGNOSIS — T84021A Dislocation of internal left hip prosthesis, initial encounter: Secondary | ICD-10-CM | POA: Diagnosis present

## 2024-06-12 DIAGNOSIS — T84091A Other mechanical complication of internal left hip prosthesis, initial encounter: Secondary | ICD-10-CM | POA: Diagnosis present

## 2024-06-12 DIAGNOSIS — Z90710 Acquired absence of both cervix and uterus: Secondary | ICD-10-CM | POA: Diagnosis not present

## 2024-06-12 DIAGNOSIS — Z96653 Presence of artificial knee joint, bilateral: Secondary | ICD-10-CM | POA: Diagnosis present

## 2024-06-12 DIAGNOSIS — Z96641 Presence of right artificial hip joint: Secondary | ICD-10-CM | POA: Diagnosis present

## 2024-07-12 DIAGNOSIS — M24452 Recurrent dislocation, left hip: Secondary | ICD-10-CM | POA: Diagnosis not present

## 2024-07-12 DIAGNOSIS — G894 Chronic pain syndrome: Secondary | ICD-10-CM | POA: Diagnosis not present

## 2024-07-12 DIAGNOSIS — Z780 Asymptomatic menopausal state: Secondary | ICD-10-CM | POA: Diagnosis not present

## 2024-07-12 DIAGNOSIS — F900 Attention-deficit hyperactivity disorder, predominantly inattentive type: Secondary | ICD-10-CM | POA: Diagnosis not present

## 2024-07-12 DIAGNOSIS — M85852 Other specified disorders of bone density and structure, left thigh: Secondary | ICD-10-CM | POA: Diagnosis not present

## 2024-07-12 DIAGNOSIS — F321 Major depressive disorder, single episode, moderate: Secondary | ICD-10-CM | POA: Diagnosis not present

## 2024-07-12 DIAGNOSIS — Z6824 Body mass index (BMI) 24.0-24.9, adult: Secondary | ICD-10-CM | POA: Diagnosis not present

## 2024-07-12 DIAGNOSIS — R5383 Other fatigue: Secondary | ICD-10-CM | POA: Diagnosis not present

## 2024-07-12 DIAGNOSIS — Z0000 Encounter for general adult medical examination without abnormal findings: Secondary | ICD-10-CM | POA: Diagnosis not present

## 2024-07-12 DIAGNOSIS — M2550 Pain in unspecified joint: Secondary | ICD-10-CM | POA: Diagnosis not present

## 2024-07-12 DIAGNOSIS — I10 Essential (primary) hypertension: Secondary | ICD-10-CM | POA: Diagnosis not present

## 2024-07-12 DIAGNOSIS — E039 Hypothyroidism, unspecified: Secondary | ICD-10-CM | POA: Diagnosis not present

## 2024-09-04 ENCOUNTER — Other Ambulatory Visit: Payer: Self-pay

## 2024-09-04 ENCOUNTER — Ambulatory Visit (HOSPITAL_BASED_OUTPATIENT_CLINIC_OR_DEPARTMENT_OTHER): Admitting: Physical Therapy

## 2024-09-04 ENCOUNTER — Encounter (HOSPITAL_BASED_OUTPATIENT_CLINIC_OR_DEPARTMENT_OTHER): Payer: Self-pay | Admitting: Physical Therapy

## 2024-09-04 DIAGNOSIS — M5459 Other low back pain: Secondary | ICD-10-CM

## 2024-09-04 DIAGNOSIS — M6281 Muscle weakness (generalized): Secondary | ICD-10-CM

## 2024-09-04 DIAGNOSIS — R2689 Other abnormalities of gait and mobility: Secondary | ICD-10-CM

## 2024-09-05 ENCOUNTER — Ambulatory Visit (HOSPITAL_BASED_OUTPATIENT_CLINIC_OR_DEPARTMENT_OTHER): Admitting: Physical Therapy

## 2024-09-05 ENCOUNTER — Encounter (HOSPITAL_BASED_OUTPATIENT_CLINIC_OR_DEPARTMENT_OTHER): Payer: Self-pay | Admitting: Physical Therapy

## 2024-09-05 DIAGNOSIS — M5459 Other low back pain: Secondary | ICD-10-CM

## 2024-09-05 DIAGNOSIS — R2689 Other abnormalities of gait and mobility: Secondary | ICD-10-CM

## 2024-09-05 DIAGNOSIS — M6281 Muscle weakness (generalized): Secondary | ICD-10-CM

## 2024-09-05 NOTE — Therapy (Signed)
 " OUTPATIENT PHYSICAL THERAPY LOWER EXTREMITY TREAMTENT   Patient Name: Virginia Gordon MRN: [id number] DOB:1951/06/24, 74 y.o., female Today's Date: 09/05/2024  END OF SESSION:  PT End of Session - 09/05/24 1318     Visit Number 2    Date for Recertification  11/16/24    Authorization Type mcr    Progress Note Due on Visit 10    PT Start Time 1315    PT Stop Time 1355    PT Time Calculation (min) 40 min    Activity Tolerance Patient tolerated treatment well    Behavior During Therapy WFL for tasks assessed/performed          Past Medical History:  Diagnosis Date   Rheumatoid arthritis (HCC)    Past Surgical History:  Procedure Laterality Date   BREAST BIOPSY Left    pt not sure of date.   HIP SURGERY     SHOULDER SURGERY     Patient Active Problem List   Diagnosis Date Noted   Effusion of left knee joint 08/17/2021   ADD (attention deficit disorder) 11/13/2015   Connective tissue disease overlap syndrome 11/13/2015   Menopausal and perimenopausal disorder 11/13/2015   Acne erythematosa 11/13/2015   Disordered sleep 11/13/2015   History of operative procedure on hip 07/03/2015   Left knee pain 05/20/2014   Right foot pain 05/14/2014   Bilateral shoulder pain 05/14/2014   Hypothyroidism 03/08/2014   Degenerative arthritis of hip 03/07/2012    PCP: Sari Pay MD  REFERRING PROVIDER:   Bary Rosina RIGGERS    REFERRING DIAG:  S03.347 (ICD-10-CM) - Presence of left artificial knee joint  [phone] (ICD-10-CM) - Presence of artificial hip joint, bilateral    THERAPY DIAG:  Muscle weakness (generalized)  Other abnormalities of gait and mobility  Other low back pain  Rationale for Evaluation and Treatment: Rehabilitation  ONSET DATE: 6 month exacerbation  SUBJECTIVE:   SUBJECTIVE STATEMENT: No changes since eval  Initial Subjective R TKR April 2025; Lt TKR 05/15/24; 4 weeks later surgery for Lt hip as I woke up in the morning and it was dislocated.  Lt hip is so weak it has increased my back pain.  I was in pretty good shape prior to surgery swimming a mile 3 x week. Pain is in my arms and shoulders because I need to walk with and ad.  My knees and hips feel ok, LBP.  My hip is just really week  PERTINENT HISTORY:  April 2025 left TKA ;Nov 2025 revision total hip arthroplasty (liner exchange to reorient her constrained liner after a dislocation).  Right shoulder pain; right knee pain; Right Knee OA ; RA; connection tissue disease overlap syndrome. Charcot Foot   PAIN:  Are you having pain? Yes: NPRS scale: 3/10 current and worst; least: I can sleep Pain location: diffuse >lb Pain description: ache Aggravating factors: weight bearing through arms Relieving factors: ice, resting  PRECAUTIONS: Other: LLE does not cross midline   WEIGHT BEARING RESTRICTIONS: No  FALLS:  Has patient fallen in last 6 months? No  LIVING ENVIRONMENT: 2 steps in the house and 2 spots around the house in various areas  OCCUPATION:  Retired    PLOF: Independent with community mobility with device  PATIENT GOALS: strengthening hip; return to swimming with reduced paun  NEXT MD VISIT: couple of months  OBJECTIVE:  Note: Objective measures were completed at Evaluation unless otherwise noted.  PATIENT SURVEYS:  LEFS: 32/80  POSTURE: left pelvic obliquity, forward flexed  LOWER EXTREMITY ROM:  Active ROM Right eval Left eval  Hip flexion 100 100  Hip extension    Hip abduction    Hip adduction    Hip internal rotation    Hip external rotation    Knee flexion 125 125  Knee extension 0 0  Ankle dorsiflexion    Ankle plantarflexion    Ankle inversion    Ankle eversion     (Blank rows = not tested)  LOWER EXTREMITY MMT:  HD lbs Tested in sitting on pool bench Right eval Left eval  Hip flexion 32.9 39.8  Hip extension    Hip abduction 20.0 8.5  Hip adduction    Hip internal rotation    Hip external rotation    Knee flexion     Knee extension 29.4 25.7  Ankle dorsiflexion    Ankle plantarflexion    Ankle inversion    Ankle eversion     (Blank rows = not tested)    FUNCTIONAL TESTS:  5 times sit to stand: 20.96  TUG:32.87 GAIT: Distance walked: 500 ft Assistive device utilized: Walker - 4 wheeled Level of assistance: Complete Independence Comments: Signifcant left trendelenburgh gait                                                                                                                                 TREATMENT  [facility] Adult PT Treatment:                                                DATE: 09/05/24 Pt seen for aquatic therapy today.  Treatment took place in water 3.5-4.75 ft in depth at the [facility] pool. Temp of water was 91.  Pt entered/exited the pool via stairs using step to pattern with hand rail.   *walking forward, back and side stepping in 3.6 ft with ue support of barbell *Forward march with kick *weight shift laterally stopping at midline toward Rt *RT hip hike x 5 standing in 3 ft then Lt x5 *traditional squats ue support wall 3.6 ft.  *ue support wall 3.6 ft: hip abd; hip ext 2 x 5-8 *L stretch *TrA engagement using 1/2 noodle pull down wide stance *Cycling on noodle  Pt requires the buoyancy and hydrostatic pressure of water for support, and to offload joints by unweighting joint load by at least 50 % in navel deep water and by at least 75-80% in chest to neck deep water.  Viscosity of the water is needed for resistance of strengthening. Water current perturbations provides challenge to standing balance requiring increased core activation.       PATIENT EDUCATION:  Education details: Discussed eval findings, rehab rationale, aquatic program progression/POC and pools in area. Patient is in agreement  Person educated: Patient Education method: Explanation Education comprehension: verbalized understanding  HOME EXERCISE PROGRAM: Previous episode:Access Code:  [email or web address]   ASSESSMENT:  CLINICAL IMPRESSION: Pt demonstrates safety and independence in aquatic setting with therapist instructing from deck. Pt is confident in setting, moving throughout all depths easily.  She is directed through various movement patterns and trials in both sitting and standing positions.   Pt is provided VC and demonstration throughout session for execution of exercises  while monitoring toleration. Focus today on left hip and core strength, shifting weight to level hips and core engagement. She is a good candidate for aquatic intervention and will benefit from the properties of water to progress towards functional goals.     Initial Impression Patient is a 74 y.o. f who was seen today for physical therapy evaluation and treatment for weakness ude to bilateral TKR and Lt hip replacement.  Pt is well known to this clinic as she has had multiple episodes of care due to her many orthopedic dysfunctions.  She presents today after having bilat TKR in past 6 months and revision of Lt hip replacement there after.  She reports a few months of non activity increasing LBP which has been previously managed with consistent exercise. Her bilat ue are sore due to heavy use with AD for amb. She reports low hip and knee pain. Patient demonstrates deficits in core and LE strength, endurance, activity tolerance, gait, balance, and functional mobility with ADL's. She is having to modify and restrict ADL's as indicated by outcome measure score as well as subjective information and objective measures which is affecting overall participation. Patient will benefit from skilled physical therapy in order to improve function and reduce impairment.      OBJECTIVE IMPAIRMENTS: Abnormal gait, decreased activity tolerance, decreased balance, decreased endurance, decreased knowledge of use of DME, decreased mobility, difficulty walking, decreased ROM, decreased strength, increased fascial restrictions,  increased muscle spasms, postural dysfunction, and pain.    ACTIVITY LIMITATIONS: carrying, lifting, bending, standing, squatting, stairs, transfers, and locomotion level   PARTICIPATION LIMITATIONS: meal prep, cleaning, laundry, driving, shopping, community activity, and yard work   PERSONAL FACTORS: 3+ comorbidities: RA ; multiple hip dislocations; right knee OA; bilateral RTC tears  are also affecting patient's functional outcome.   REHAB POTENTIAL: Fair chronicity of condition  CLINICAL DECISION MAKING: Stable/uncomplicated  EVALUATION COMPLEXITY: Low   GOALS: Goals reviewed with patient? Yes  SHORT TERM GOALS: Target date: 09/30/24 Pt will tolerate full aquatic sessions consistently without increase in pain and with improving function to demonstrate good toleration and effectiveness of intervention.  Baseline: Goal status: INITIAL  2.  Patient will be independent with basic HEP for hip strengthening with no pain or instability  Baseline:  Goal status: INITIAL  3.  Pt will return to exercising in pool 3 x week Baseline:  Goal status: INITIAL   LONG TERM GOALS: Target date: 11/16/24  Pt to improve on LEFS by at least 9 point to demonstrate statistically significant Improvement in function Baseline: 32/80 Goal status: INITIAL  2.  Pt will improve strength in all areas listed to demonstrate improved overall physical function Baseline:  Goal status: INITIAL  3.  Pt will return to swimming 1 mile weekly Baseline:  Goal status: INITIAL  4.  Pt will report reduction in LBP by 50% for improved toleration to activity Baseline:  Goal status: INITIAL  5.  Pt will be indep with final HEP's (land and aquatic as appropriate) for continued management of condition Baseline:  Goal status: INITIAL   PLAN:  PT FREQUENCY: 1-2x/week  PT DURATION: 10 weeks  PLANNED INTERVENTIONS: 97164- PT Re-evaluation, 97750- Physical Performance Testing, 97110-Therapeutic exercises, 97530-  Therapeutic activity, 97112- Neuromuscular re-education, [phone]- Self Care, 02859- Manual therapy, [phone]- Gait training, [phone]- Aquatic Therapy, [phone]- Electrical stimulation (unattended), [phone]- Electrical stimulation (manual), [id number]- Ionotophoresis 4mg /ml Dexamethasone , 79439 (1-2 muscles), 20561 (3+ muscles)- Dry Needling, Patient/Family education, Balance training, Stair training, Taping, Joint mobilization, DME instructions, Cryotherapy, and Moist heat  PLAN FOR NEXT SESSION: aquatics only initially: customer service manager, balance retraining   Ronal Foots) Yvetta Drotar MPT 09/05/24 1:19 PM [facility] [address] [place], [place], [address] Phone: [phone]   Fax:  [phone]    "

## 2024-10-10 ENCOUNTER — Ambulatory Visit (HOSPITAL_BASED_OUTPATIENT_CLINIC_OR_DEPARTMENT_OTHER): Payer: Self-pay | Admitting: Physical Therapy

## 2024-10-15 ENCOUNTER — Ambulatory Visit (HOSPITAL_BASED_OUTPATIENT_CLINIC_OR_DEPARTMENT_OTHER): Payer: Self-pay | Admitting: Physical Therapy

## 2024-10-25 ENCOUNTER — Ambulatory Visit (HOSPITAL_BASED_OUTPATIENT_CLINIC_OR_DEPARTMENT_OTHER): Payer: Self-pay | Admitting: Physical Therapy

## 2024-10-29 ENCOUNTER — Ambulatory Visit (HOSPITAL_BASED_OUTPATIENT_CLINIC_OR_DEPARTMENT_OTHER): Payer: Self-pay | Admitting: Physical Therapy

## 2024-11-05 ENCOUNTER — Ambulatory Visit (HOSPITAL_BASED_OUTPATIENT_CLINIC_OR_DEPARTMENT_OTHER): Payer: Self-pay | Admitting: Physical Therapy
